# Patient Record
Sex: Female | Born: 1955 | Race: Black or African American | Hispanic: No | Smoking: Never smoker
Health system: Southern US, Community
[De-identification: ages and names within clinical notes are randomized; demographics above are authoritative.]

## PROBLEM LIST (undated history)

## (undated) DIAGNOSIS — E119 Type 2 diabetes mellitus without complications: Secondary | ICD-10-CM

## (undated) DIAGNOSIS — C801 Malignant (primary) neoplasm, unspecified: Secondary | ICD-10-CM

## (undated) DIAGNOSIS — I2510 Atherosclerotic heart disease of native coronary artery without angina pectoris: Secondary | ICD-10-CM

## (undated) DIAGNOSIS — Z9221 Personal history of antineoplastic chemotherapy: Secondary | ICD-10-CM

## (undated) DIAGNOSIS — I10 Essential (primary) hypertension: Secondary | ICD-10-CM

## (undated) HISTORY — PX: CYST EXCISION: SHX5701

## (undated) HISTORY — PX: COLONOSCOPY: SHX174

---

## 1986-10-13 HISTORY — PX: FRACTURE SURGERY: SHX138

## 1993-10-13 HISTORY — PX: ABDOMINAL HYSTERECTOMY: SHX81

## 2004-11-13 ENCOUNTER — Ambulatory Visit: Payer: Self-pay

## 2006-03-12 ENCOUNTER — Ambulatory Visit: Payer: Self-pay | Admitting: Family Medicine

## 2007-04-28 ENCOUNTER — Ambulatory Visit: Payer: Self-pay | Admitting: Family Medicine

## 2007-07-23 ENCOUNTER — Ambulatory Visit: Payer: Self-pay | Admitting: Podiatry

## 2007-07-23 ENCOUNTER — Other Ambulatory Visit: Payer: Self-pay

## 2007-07-30 ENCOUNTER — Ambulatory Visit: Payer: Self-pay | Admitting: Podiatry

## 2009-05-24 DIAGNOSIS — E7800 Pure hypercholesterolemia, unspecified: Secondary | ICD-10-CM | POA: Insufficient documentation

## 2009-05-24 DIAGNOSIS — I152 Hypertension secondary to endocrine disorders: Secondary | ICD-10-CM | POA: Insufficient documentation

## 2009-05-24 DIAGNOSIS — E1159 Type 2 diabetes mellitus with other circulatory complications: Secondary | ICD-10-CM | POA: Insufficient documentation

## 2009-05-24 DIAGNOSIS — E119 Type 2 diabetes mellitus without complications: Secondary | ICD-10-CM | POA: Insufficient documentation

## 2009-10-13 HISTORY — PX: COLON RESECTION: SHX5231

## 2009-12-12 ENCOUNTER — Ambulatory Visit: Payer: Self-pay | Admitting: Family Medicine

## 2010-07-04 ENCOUNTER — Ambulatory Visit: Payer: Self-pay | Admitting: Gastroenterology

## 2010-07-09 ENCOUNTER — Ambulatory Visit: Payer: Self-pay | Admitting: Gastroenterology

## 2010-07-11 ENCOUNTER — Ambulatory Visit: Payer: Self-pay | Admitting: Emergency Medicine

## 2010-07-12 ENCOUNTER — Inpatient Hospital Stay: Payer: Self-pay | Admitting: Emergency Medicine

## 2010-07-13 ENCOUNTER — Ambulatory Visit: Payer: Self-pay | Admitting: Internal Medicine

## 2010-07-16 LAB — PATHOLOGY REPORT

## 2010-07-25 ENCOUNTER — Ambulatory Visit: Payer: Self-pay | Admitting: Internal Medicine

## 2010-08-12 ENCOUNTER — Ambulatory Visit: Payer: Self-pay | Admitting: Emergency Medicine

## 2010-08-13 ENCOUNTER — Ambulatory Visit: Payer: Self-pay | Admitting: Internal Medicine

## 2010-08-16 ENCOUNTER — Observation Stay: Payer: Self-pay | Admitting: Internal Medicine

## 2010-09-12 ENCOUNTER — Ambulatory Visit: Payer: Self-pay | Admitting: Internal Medicine

## 2010-10-13 ENCOUNTER — Ambulatory Visit: Payer: Self-pay | Admitting: Internal Medicine

## 2010-11-13 ENCOUNTER — Ambulatory Visit: Payer: Self-pay | Admitting: Internal Medicine

## 2010-12-12 ENCOUNTER — Ambulatory Visit: Payer: Self-pay | Admitting: Internal Medicine

## 2011-01-11 IMAGING — CR DG CHEST 1V PORT
1 series · 1 of 1 positions shown · non-contrast
Comparison: none

REASON FOR EXAM: to see the porta catheter and look for pneumothorax in
recovery
COMMENTS:

[view not recorded]
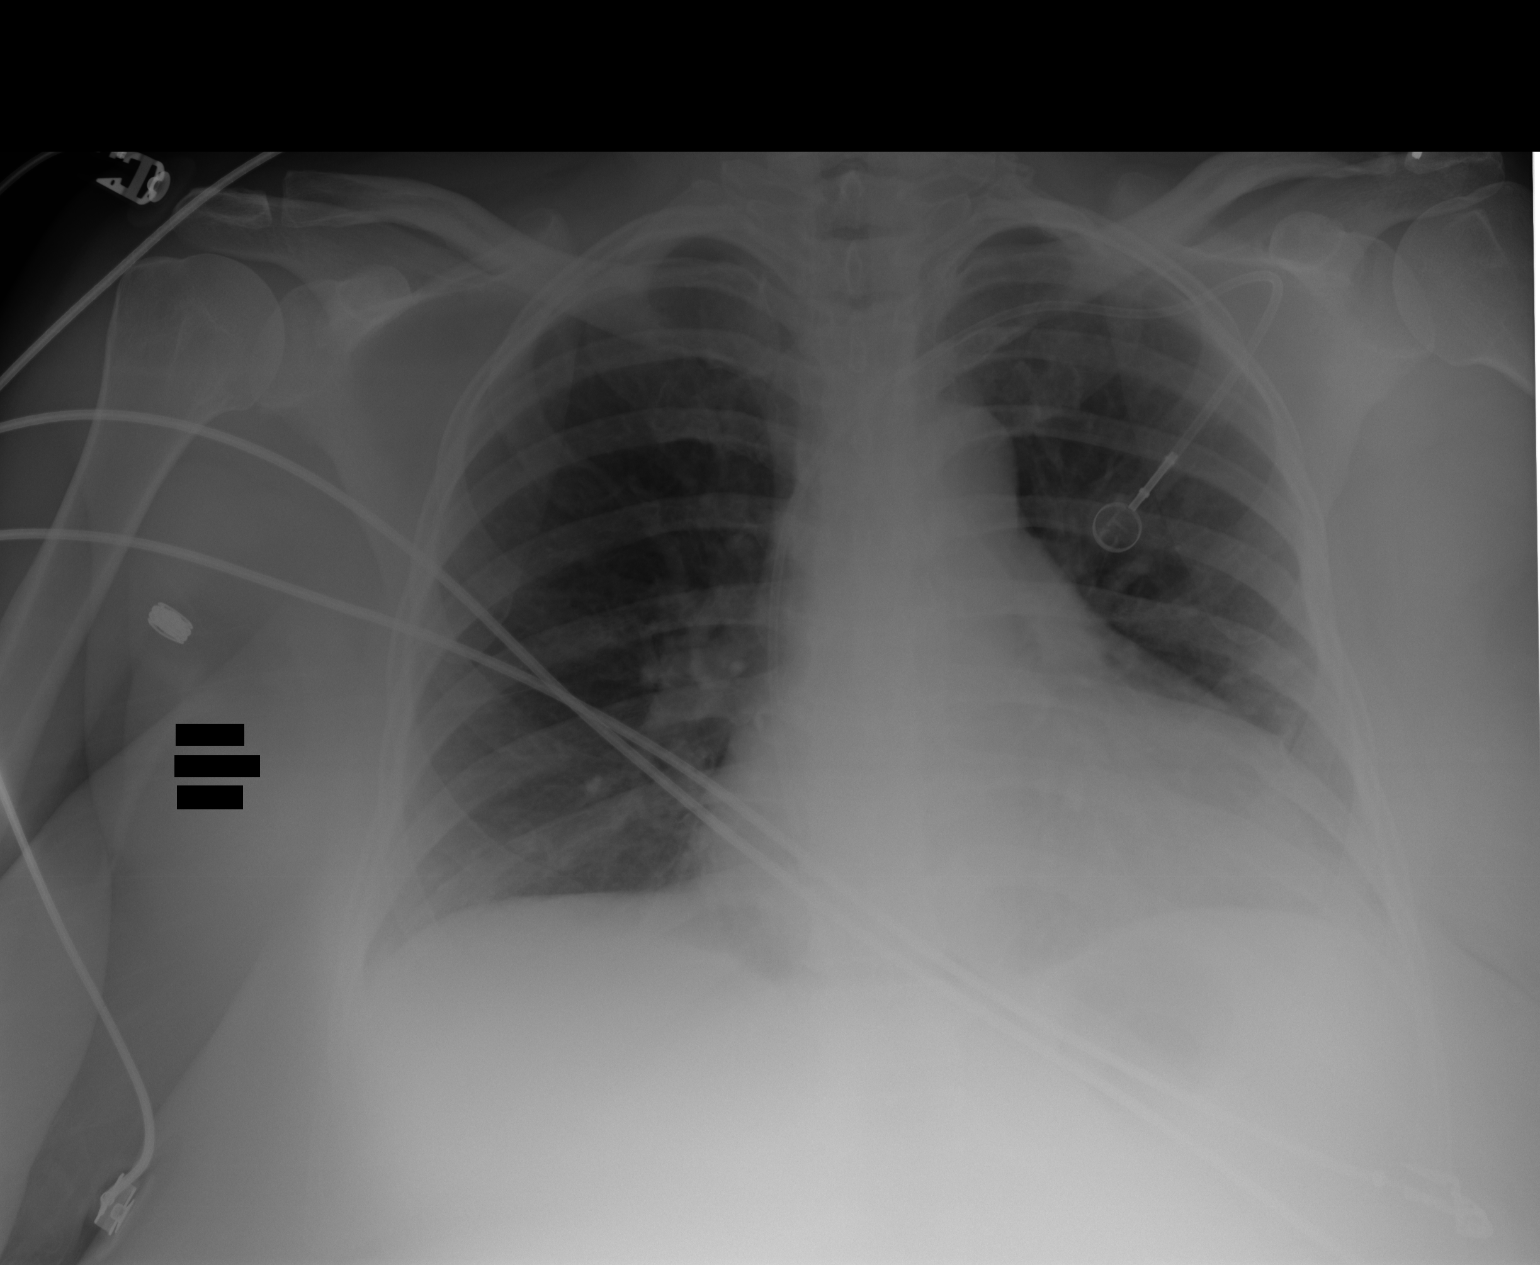

[1 of 1 positions shown; findings below may reference images not displayed]

PROCEDURE:     DXR - DXR PORTABLE CHEST SINGLE VIEW  - August 12, 2010 [DATE]

RESULT:     A left-sided Port-A-Cath device is present with the tip of the
catheter in the superior vena cava at the junction with right atrium. The
lungs appear fully inflated and clear. The heart is at the upper limits of
normal in size.
IMPRESSION: Left-sided Port-A-Cath present. No evidence of complication.

## 2011-01-12 ENCOUNTER — Ambulatory Visit: Payer: Self-pay | Admitting: Internal Medicine

## 2011-01-18 IMAGING — CR DG CHEST 2V
1 series · 2 of 2 positions shown · non-contrast
Comparison: none

REASON FOR EXAM: chf
COMMENTS:

[Series 1: view not recorded · 0.17mm/px · 2 of 2 slices shown]
[im 1/2]
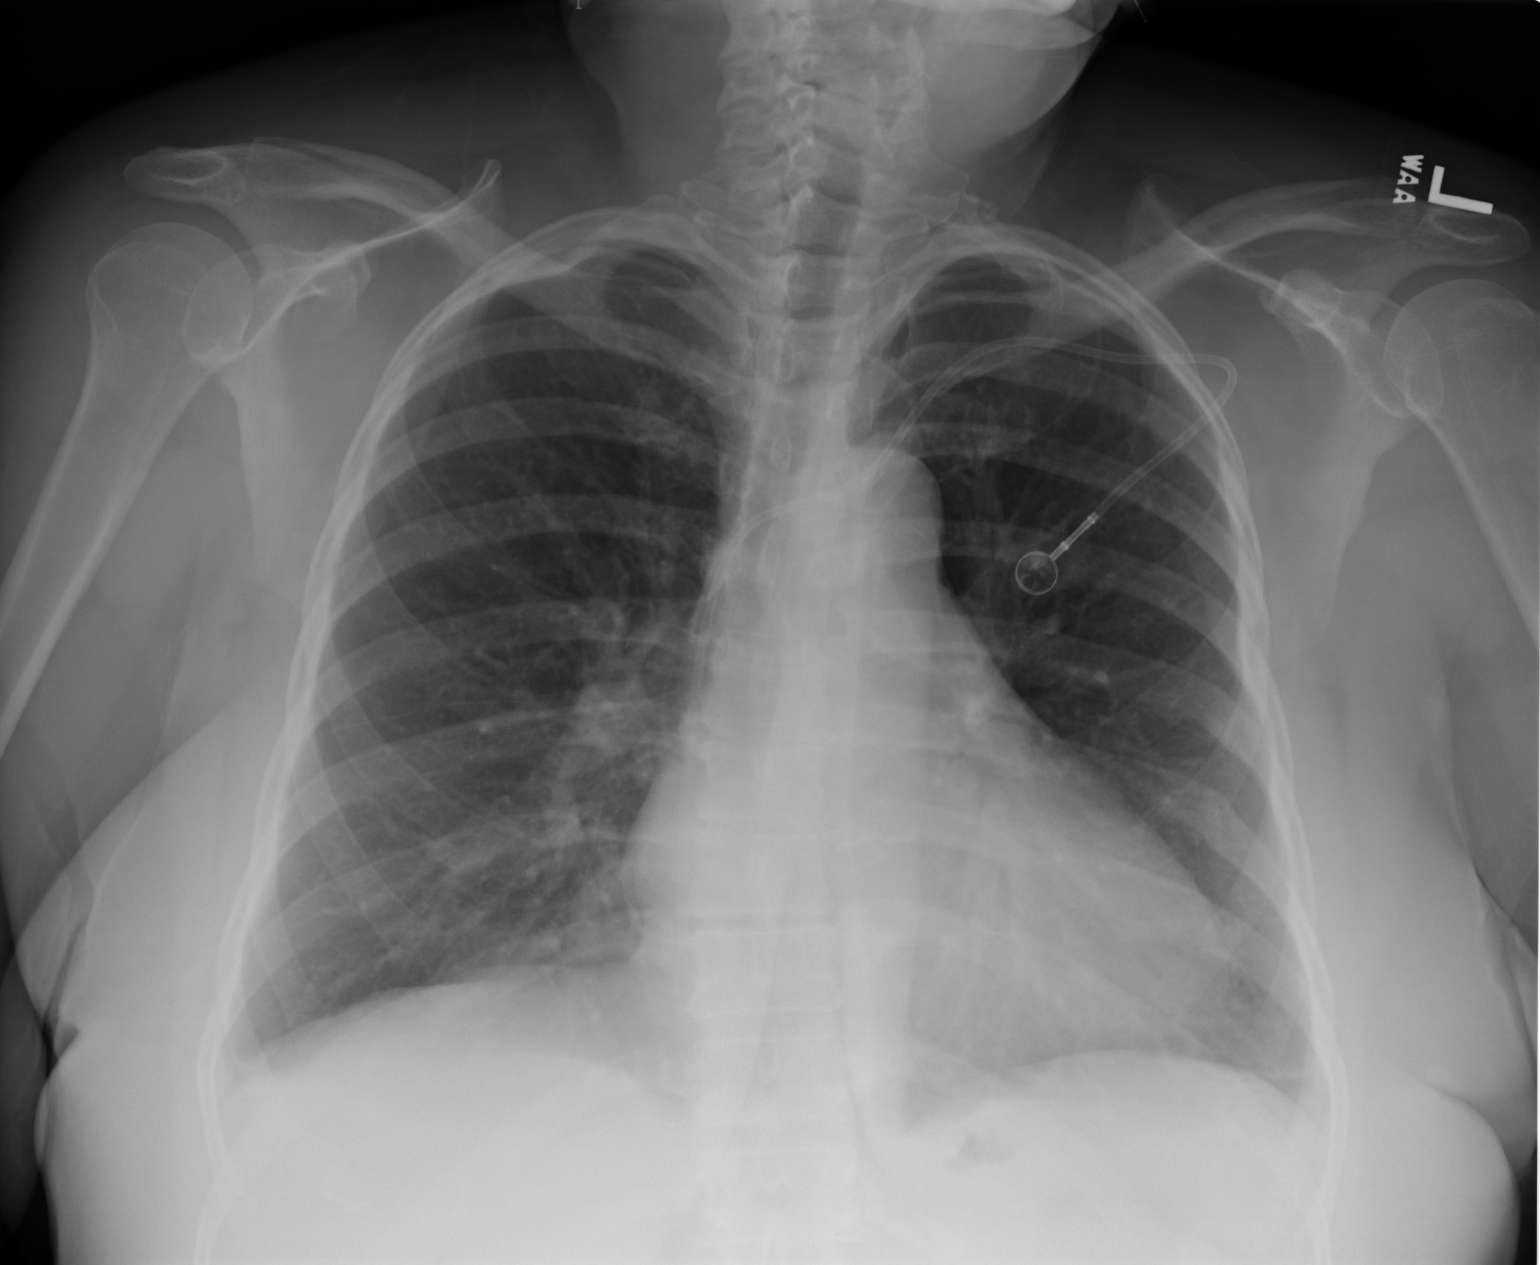
[im 2/2]
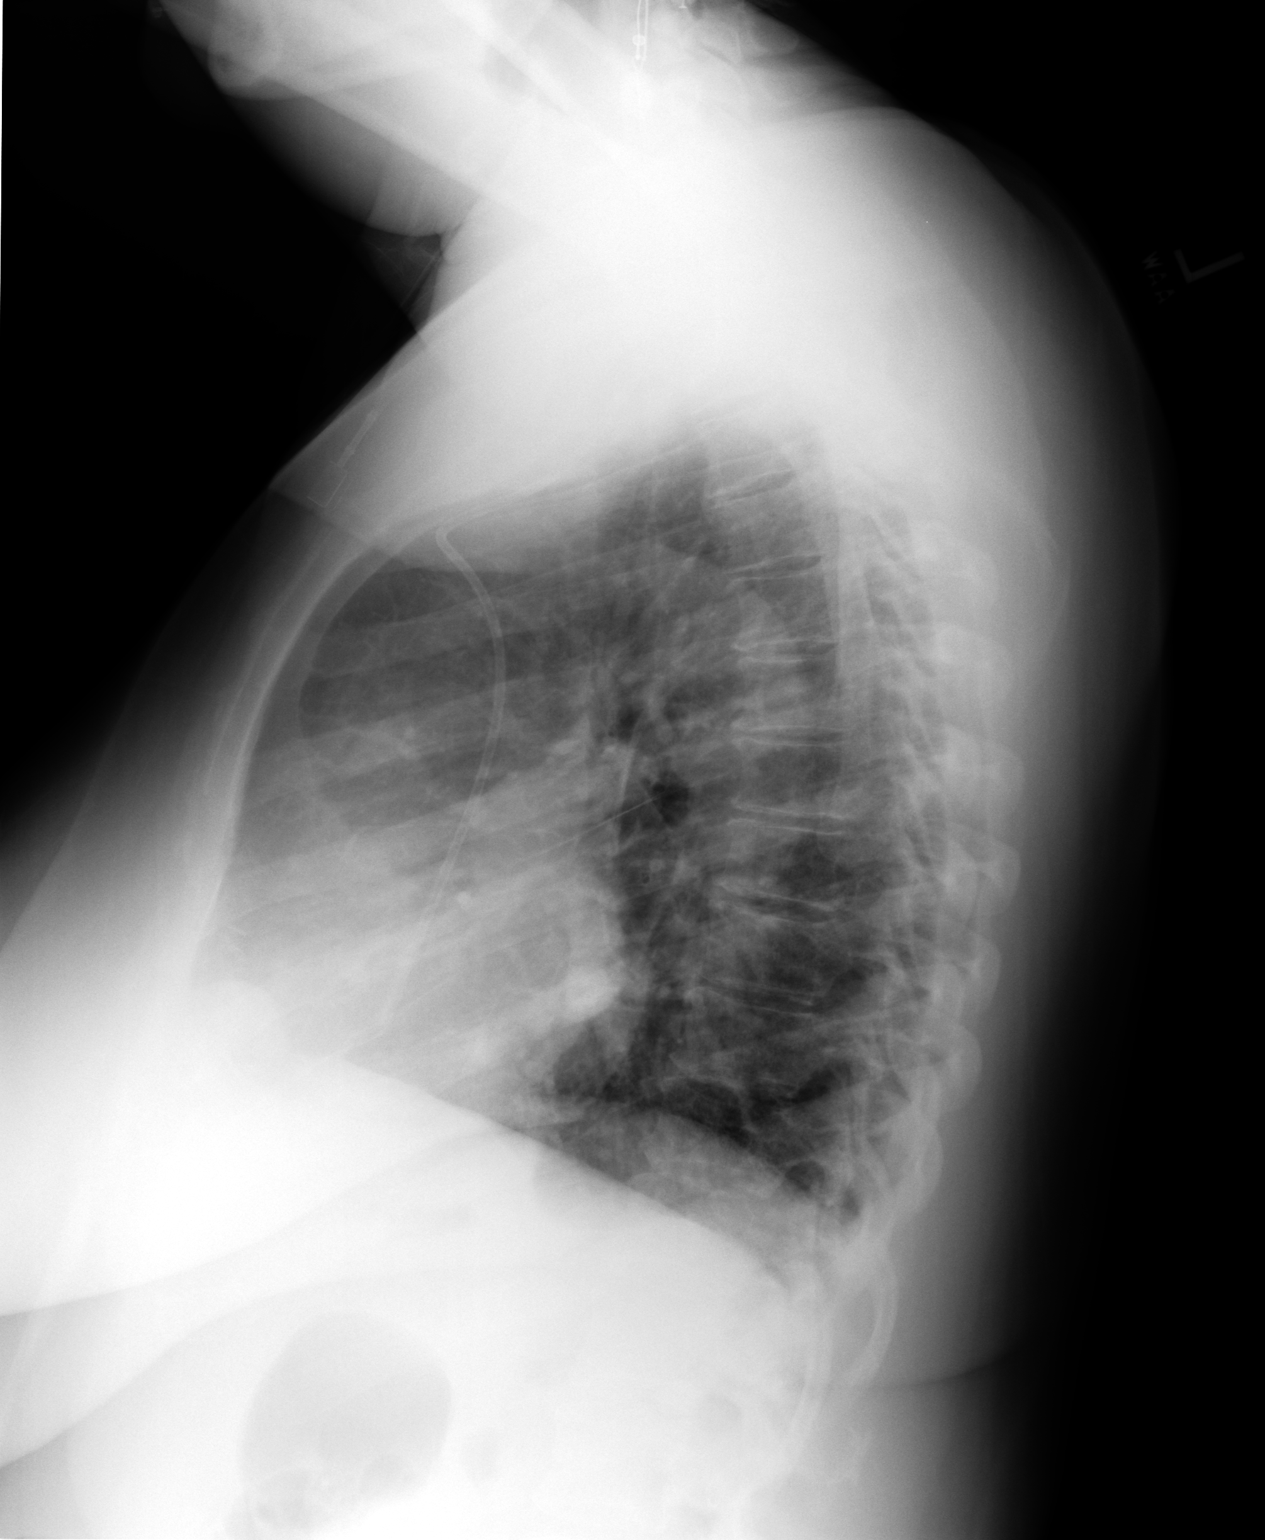

[2 of 2 positions shown; findings below may reference images not displayed]

PROCEDURE:     DXR - DXR CHEST PA (OR AP) AND LATERAL  - August 19, 2010 [DATE]

RESULT:     Comparison is made to the prior exam of 08/17/2010.

A Port-A-Cath is present with the tip projected near the junction of the
superior vena cava and right atrium. The lung fields are clear. No
pneumonia, pneumothorax or pleural effusion is seen. No pulmonary nodules or
pulmonary masses are seen. The heart is upper limits for normal in size. No
acute bony abnormalities are seen. There is deformity of multiple left ribs
compatible with residual change from prior fractures or prior surgery.
IMPRESSION: 1.  No acute changes are identified.
2.  The heart is upper limits for normal in size.
3.  A Port-A-Cath is present.
4.  No findings indicative of CHF are identified on the current exam.

## 2011-02-11 ENCOUNTER — Ambulatory Visit: Payer: Self-pay | Admitting: Internal Medicine

## 2011-02-22 IMAGING — CT CT CHEST W/O CM
1 of 2 series · 14 of 32 positions shown, 18 images · non-contrast
Comparison: none

REASON FOR EXAM: DIABETIC ON METFORMIN Pulmonary nodules
COMMENTS:

PROCEDURE:     KCT - KCT CHEST WITHOUT CONTRAST  - September 23, 2010  [DATE]
RESULT:
TECHNIQUE: Noncontrast CT of the chest is reconstructed at 5 mm slice
thickness in the axial plane and compared to images dated 08/05/2010.

[Series 2: chest w/o 5.0 i41f · axial · non-contrast · 0.82mm/px · z∈[-810,-584]mm · 14 of 55 slices shown, 18 images]
[im 5/55  mediastinal]
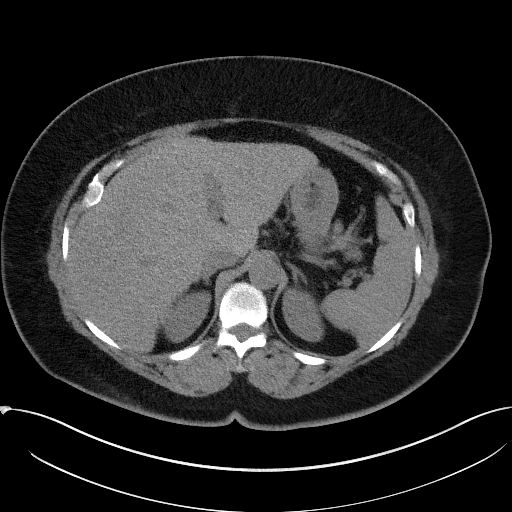
[im 5/55  lung]
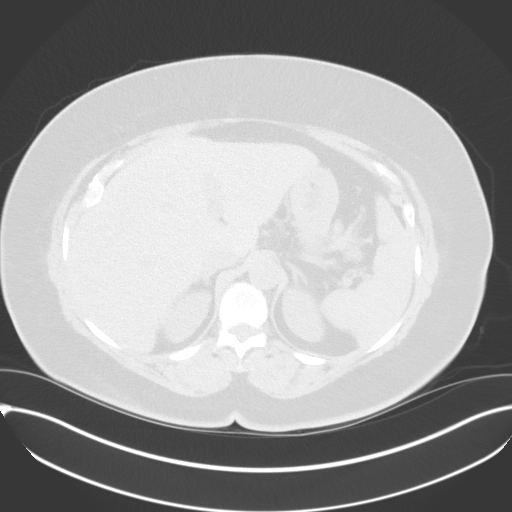
[im 9/55  lung]
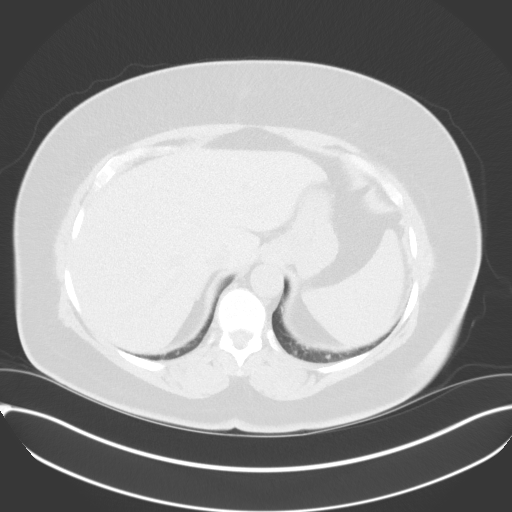
[im 13/55  lung]
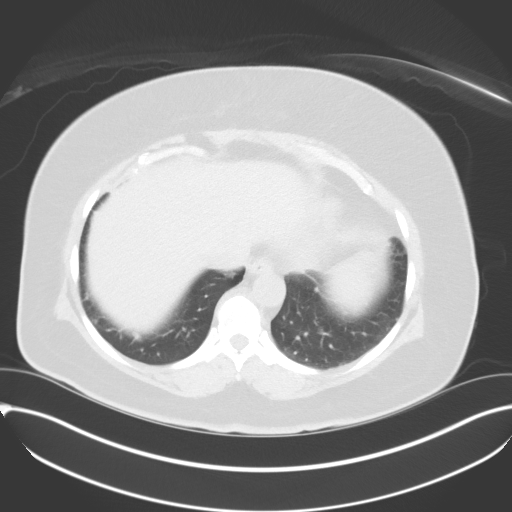
[im 17/55  lung]
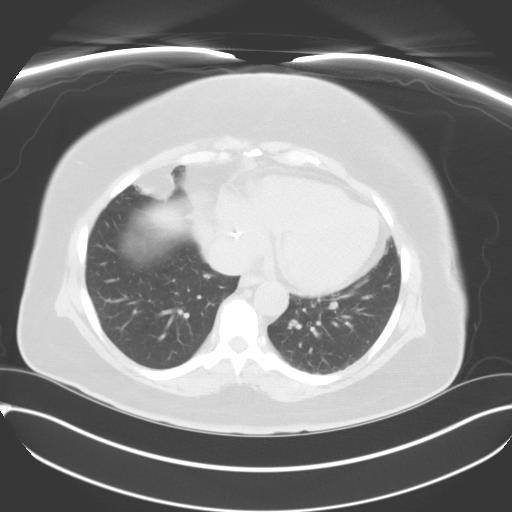
[im 21/55  mediastinal]
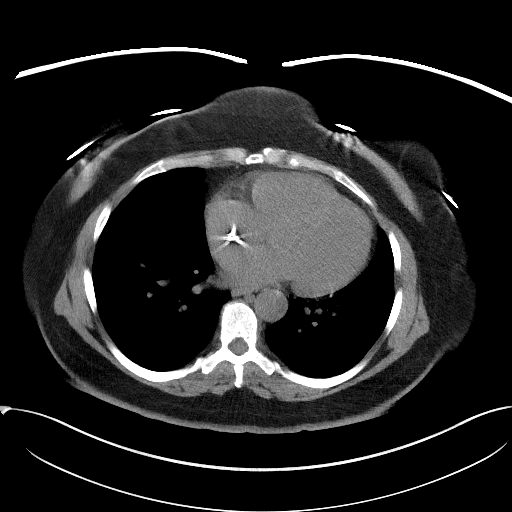
[im 21/55  lung]
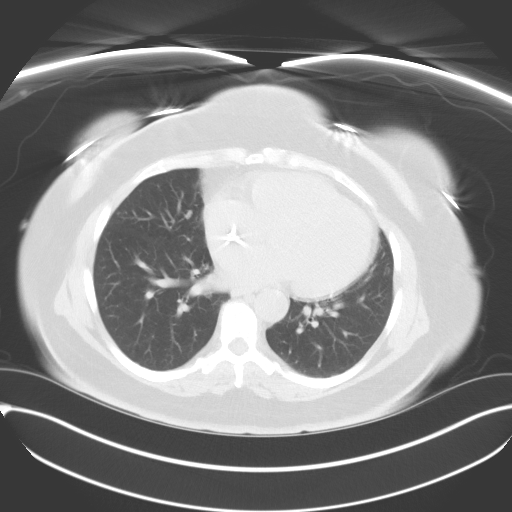
[im 25/55  lung]
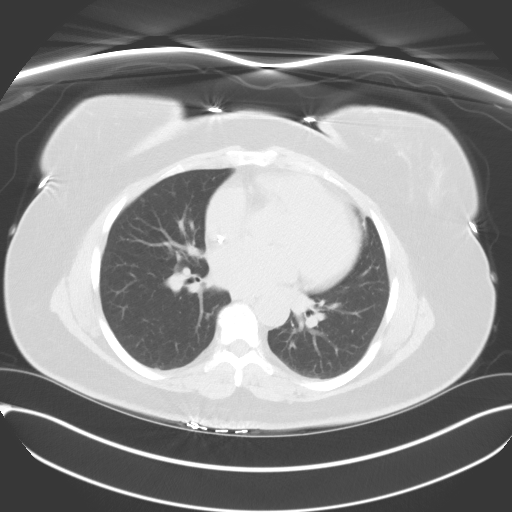
[im 26/55  lung]
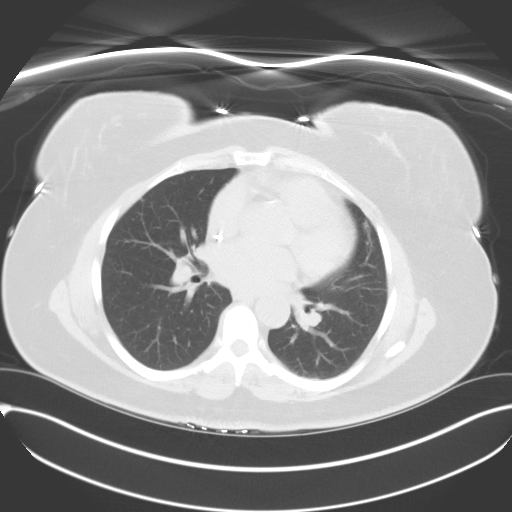
[im 28/55  lung]
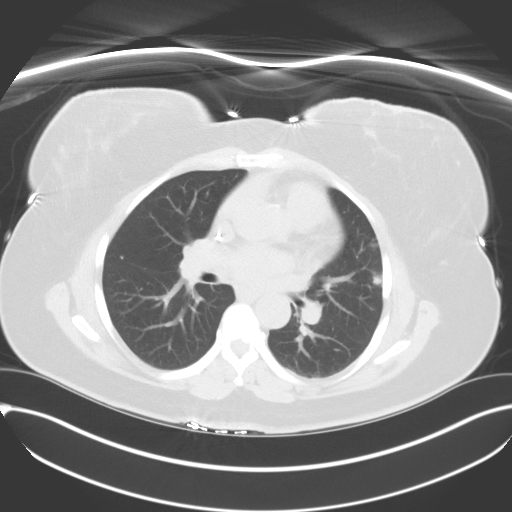
[im 30/55  mediastinal]
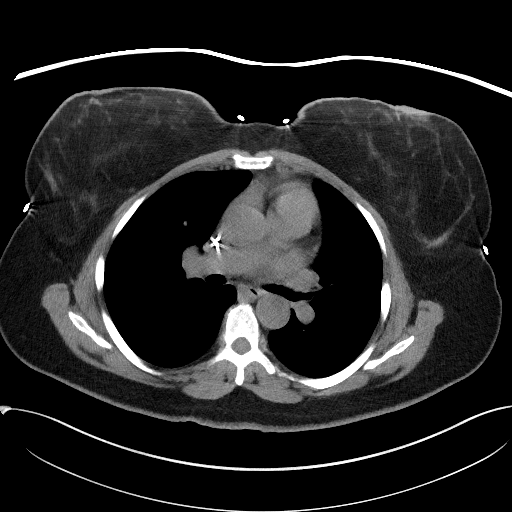
[im 30/55  lung]
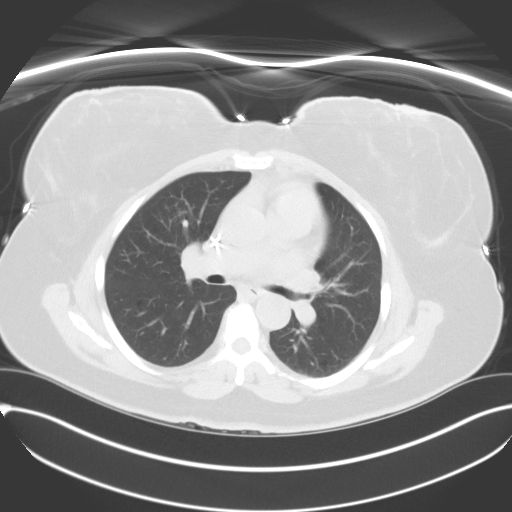
[im 34/55  lung]
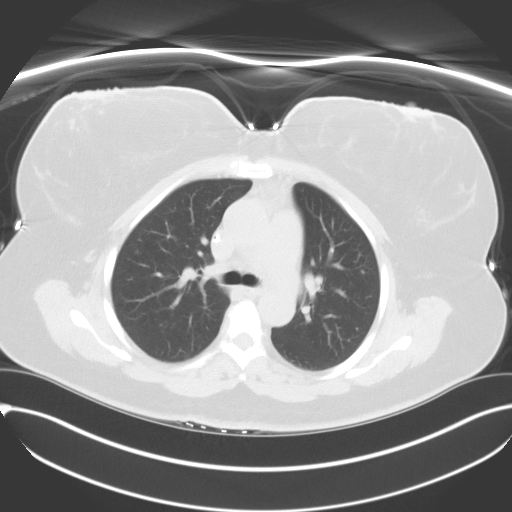
[im 38/55  lung]
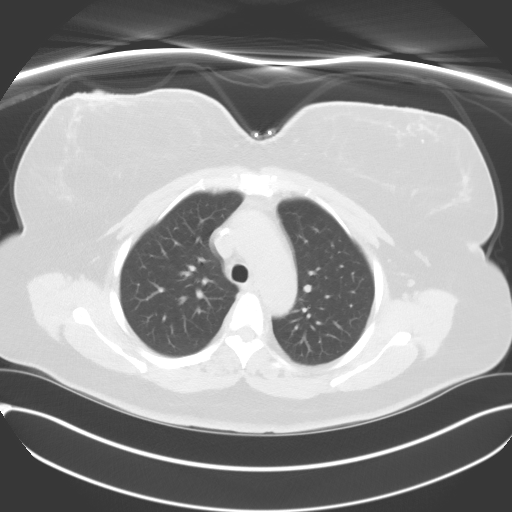
[im 42/55  lung]
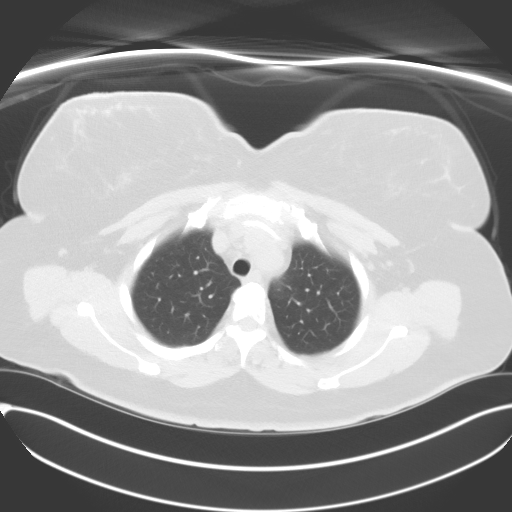
[im 46/55  mediastinal]
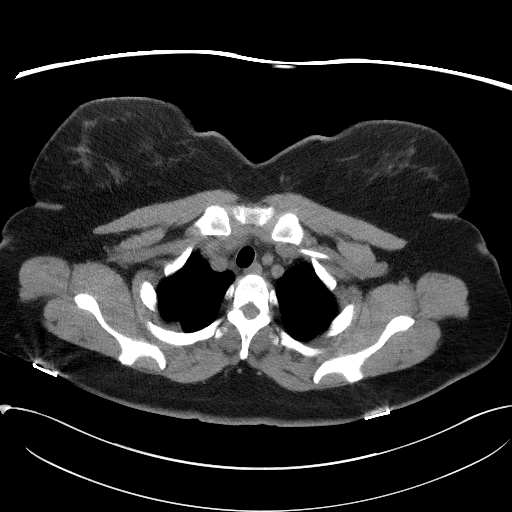
[im 46/55  lung]
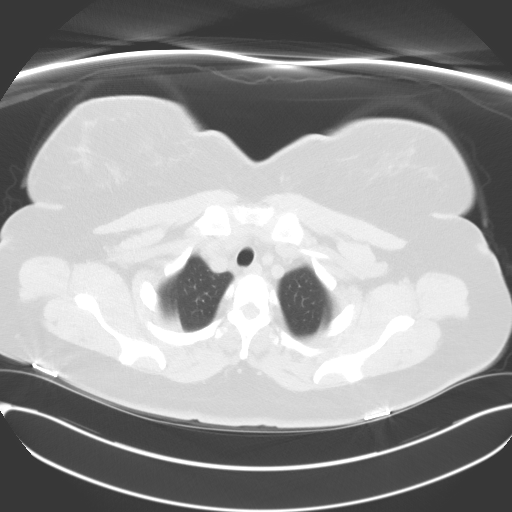
[im 50/55  lung]
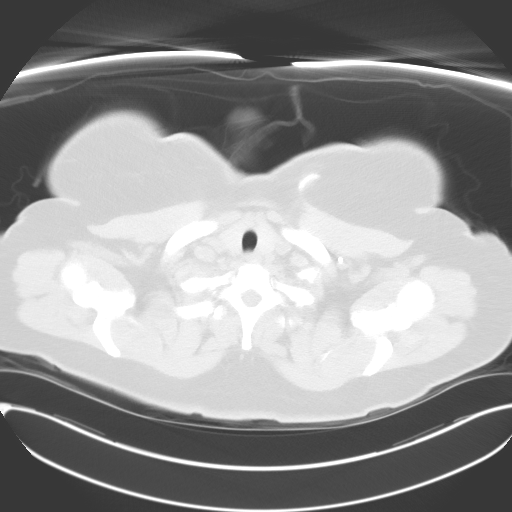

[14 of 32 positions shown; findings below may reference images not displayed]

FINDINGS: Lung window images show persistent areas of nodular density in
both lungs with a patchy, ill-defined area of increased density in the right
upper lobe anteriorly between images 24 and 25 which could represent some
minimal atelectasis or infiltrate. There is subpleural nodularity, stable in
appearance laterally in the left upper lobe on image 28, approximately
mm in diameter with ill-defined density anterior to it on image 28 and 29
measuring approximately 5 mm. Paraspinal right lower lobe nodularity on
image 32 measures 4.4 mm with right middle lobe nodular density on image 33
measuring 3.1 mm. Some additional medial segment right middle lobe
nodularity is stable measuring approximately 3.9 mm with right middle lobe
lateral nodular density, stable, measuring 5.3 mm. There is minimal lingular
nodularity which is also stable and nonspecific measuring 5.3 mm. No new
nodular density is seen. The measurements may be slightly different from
demonstrated previously but some of this could be secondary to the plane of
scan. Numerous, tiny nodular densities are seen in the posterior gutters.
These are smaller than 3 mm in size. The lungs are otherwise clear aside
from the patchy right upper lobe density mentioned above. There is no
mediastinal or hilar mass appreciated on this noncontrast exam. The included
upper abdominal structures appear unremarkable.
IMPRESSION: Multiple, bilateral pulmonary nodular densities without
calcification. No new nodules are seen. Ill-defined, increased density in
the right upper lobe as described. Minimal atelectasis or infiltrate could
be considered.

## 2011-02-26 ENCOUNTER — Ambulatory Visit: Payer: Self-pay | Admitting: Family Medicine

## 2011-03-06 ENCOUNTER — Ambulatory Visit: Payer: Self-pay | Admitting: Emergency Medicine

## 2011-03-13 ENCOUNTER — Ambulatory Visit: Payer: Self-pay | Admitting: Family Medicine

## 2011-03-14 ENCOUNTER — Ambulatory Visit: Payer: Self-pay | Admitting: Internal Medicine

## 2011-04-10 LAB — CEA: CEA: 1.8 ng/mL (ref 0.0–4.7)

## 2011-04-13 ENCOUNTER — Ambulatory Visit: Payer: Self-pay | Admitting: Internal Medicine

## 2012-06-22 ENCOUNTER — Ambulatory Visit: Payer: Self-pay | Admitting: Family Medicine

## 2013-11-30 ENCOUNTER — Ambulatory Visit: Payer: Self-pay | Admitting: Family Medicine

## 2014-05-05 DIAGNOSIS — M76819 Anterior tibial syndrome, unspecified leg: Secondary | ICD-10-CM | POA: Insufficient documentation

## 2015-02-08 ENCOUNTER — Ambulatory Visit: Admit: 2015-02-08 | Disposition: A | Discharge status: Home / Self Care | Payer: Self-pay | Admitting: Family Medicine

## 2015-04-20 ENCOUNTER — Other Ambulatory Visit: Payer: Self-pay | Admitting: Family Medicine

## 2015-04-20 DIAGNOSIS — E1169 Type 2 diabetes mellitus with other specified complication: Secondary | ICD-10-CM | POA: Insufficient documentation

## 2015-04-20 DIAGNOSIS — E785 Hyperlipidemia, unspecified: Secondary | ICD-10-CM

## 2015-04-25 ENCOUNTER — Other Ambulatory Visit: Payer: Self-pay

## 2015-04-25 DIAGNOSIS — I10 Essential (primary) hypertension: Secondary | ICD-10-CM

## 2015-04-25 DIAGNOSIS — I340 Nonrheumatic mitral (valve) insufficiency: Secondary | ICD-10-CM | POA: Insufficient documentation

## 2015-04-25 DIAGNOSIS — E119 Type 2 diabetes mellitus without complications: Secondary | ICD-10-CM

## 2015-04-25 DIAGNOSIS — R519 Headache, unspecified: Secondary | ICD-10-CM | POA: Insufficient documentation

## 2015-04-25 DIAGNOSIS — R9431 Abnormal electrocardiogram [ECG] [EKG]: Secondary | ICD-10-CM | POA: Insufficient documentation

## 2015-04-25 DIAGNOSIS — I5189 Other ill-defined heart diseases: Secondary | ICD-10-CM | POA: Insufficient documentation

## 2015-04-25 DIAGNOSIS — E785 Hyperlipidemia, unspecified: Secondary | ICD-10-CM

## 2015-04-25 DIAGNOSIS — G4700 Insomnia, unspecified: Secondary | ICD-10-CM | POA: Insufficient documentation

## 2015-04-25 DIAGNOSIS — C189 Malignant neoplasm of colon, unspecified: Secondary | ICD-10-CM | POA: Insufficient documentation

## 2015-04-25 DIAGNOSIS — Z85038 Personal history of other malignant neoplasm of large intestine: Secondary | ICD-10-CM | POA: Insufficient documentation

## 2015-04-25 DIAGNOSIS — R51 Headache: Secondary | ICD-10-CM

## 2015-04-25 MED ORDER — AMLODIPINE BESYLATE 5 MG PO TABS: 5.0000 mg | ORAL_TABLET | Freq: Every day | ORAL | Status: DC

## 2015-04-25 MED ORDER — LISINOPRIL-HYDROCHLOROTHIAZIDE 10-12.5 MG PO TABS: 1.0000 | ORAL_TABLET | Freq: Every day | ORAL | Status: DC

## 2015-04-25 MED ORDER — PRAVASTATIN SODIUM 40 MG PO TABS: 40.0000 mg | ORAL_TABLET | Freq: Every day | ORAL | Status: DC

## 2015-04-25 MED ORDER — METFORMIN HCL 500 MG PO TABS: 500.0000 mg | ORAL_TABLET | Freq: Three times a day (TID) | ORAL | Status: DC

## 2015-06-22 ENCOUNTER — Ambulatory Visit (INDEPENDENT_AMBULATORY_CARE_PROVIDER_SITE_OTHER): Payer: No Typology Code available for payment source | Admitting: Family Medicine

## 2015-06-22 ENCOUNTER — Encounter: Payer: Self-pay | Admitting: Family Medicine

## 2015-06-22 VITALS — BP 126/84 | HR 68 | Temp 98.6°F | Resp 16 | Ht 65.0 in | Wt 239.0 lb

## 2015-06-22 DIAGNOSIS — I10 Essential (primary) hypertension: Secondary | ICD-10-CM

## 2015-06-22 DIAGNOSIS — J069 Acute upper respiratory infection, unspecified: Secondary | ICD-10-CM | POA: Diagnosis not present

## 2015-06-22 DIAGNOSIS — R059 Cough, unspecified: Secondary | ICD-10-CM | POA: Insufficient documentation

## 2015-06-22 DIAGNOSIS — R05 Cough: Secondary | ICD-10-CM | POA: Diagnosis not present

## 2015-06-22 DIAGNOSIS — J019 Acute sinusitis, unspecified: Secondary | ICD-10-CM

## 2015-06-22 MED ORDER — HYDROCODONE-HOMATROPINE 5-1.5 MG/5ML PO SYRP: 5.0000 mL | ORAL_SOLUTION | Freq: Three times a day (TID) | ORAL | Status: DC | PRN

## 2015-06-22 MED ORDER — AMOXICILLIN-POT CLAVULANATE 875-125 MG PO TABS: 1.0000 | ORAL_TABLET | Freq: Two times a day (BID) | ORAL | Status: DC

## 2015-06-22 NOTE — Progress Notes (Signed)
        Patient: Jodi Becker Female    DOB: December 19, 1955   59 y.o.   MRN: [id number] Visit Date: 06/22/2015  Today's Provider: Margarita Rana, MD   Chief Complaint  Patient presents with  . Cough   Subjective:    Cough       Allergies  Allergen Reactions  . Sulfa Antibiotics    Previous Medications   AMLODIPINE (NORVASC) 5 MG TABLET    Take 1 tablet (5 mg total) by mouth daily.   ASPIRIN 81 MG CHEWABLE TABLET    Chew by mouth.   CHOLECALCIFEROL (VITAMIN D3) 1000 UNITS CAPS    Take by mouth.   LISINOPRIL-HYDROCHLOROTHIAZIDE (PRINZIDE,ZESTORETIC) 10-12.5 MG PER TABLET    Take 1 tablet by mouth daily.   MAGNESIUM 250 MG TABS    Take by mouth.   METFORMIN (GLUCOPHAGE) 500 MG TABLET    Take 1 tablet (500 mg total) by mouth 3 (three) times daily.   PRAVASTATIN (PRAVACHOL) 40 MG TABLET    Take 1 tablet (40 mg total) by mouth daily.   SITAGLIPTIN (JANUVIA) 100 MG TABLET    Take by mouth.    Review of Systems  Respiratory: Positive for cough.     Social History  Substance Use Topics  . Smoking status: Never Smoker   . Smokeless tobacco: Never Used  . Alcohol Use: Yes   Objective:   There were no vitals taken for this visit.  Physical Exam      Assessment & Plan:           Margarita Rana, MD  [facility]
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
## 2015-07-09 ENCOUNTER — Encounter: Payer: Self-pay | Admitting: Family Medicine

## 2015-07-09 ENCOUNTER — Ambulatory Visit (INDEPENDENT_AMBULATORY_CARE_PROVIDER_SITE_OTHER): Payer: No Typology Code available for payment source | Admitting: Family Medicine

## 2015-07-09 VITALS — BP 104/74 | HR 64 | Temp 98.3°F | Resp 16 | Wt 239.0 lb

## 2015-07-09 DIAGNOSIS — H6980 Other specified disorders of Eustachian tube, unspecified ear: Secondary | ICD-10-CM | POA: Insufficient documentation

## 2015-07-09 DIAGNOSIS — E119 Type 2 diabetes mellitus without complications: Secondary | ICD-10-CM

## 2015-07-09 DIAGNOSIS — I10 Essential (primary) hypertension: Secondary | ICD-10-CM | POA: Diagnosis not present

## 2015-07-09 DIAGNOSIS — H6981 Other specified disorders of Eustachian tube, right ear: Secondary | ICD-10-CM

## 2015-07-09 DIAGNOSIS — J309 Allergic rhinitis, unspecified: Secondary | ICD-10-CM | POA: Insufficient documentation

## 2015-07-09 DIAGNOSIS — H6991 Unspecified Eustachian tube disorder, right ear: Secondary | ICD-10-CM

## 2015-07-09 DIAGNOSIS — Z23 Encounter for immunization: Secondary | ICD-10-CM | POA: Diagnosis not present

## 2015-07-09 DIAGNOSIS — H6990 Unspecified Eustachian tube disorder, unspecified ear: Secondary | ICD-10-CM | POA: Insufficient documentation

## 2015-07-09 DIAGNOSIS — J3089 Other allergic rhinitis: Secondary | ICD-10-CM | POA: Diagnosis not present

## 2015-07-09 MED ORDER — FLUTICASONE PROPIONATE 50 MCG/ACT NA SUSP: 2.0000 | Freq: Every day | NASAL | Status: DC
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
## 2015-07-10 LAB — HEMOGLOBIN A1C
Est. average glucose Bld gHb Est-mCnc: 171 mg/dL
Hgb A1c MFr Bld: 7.6 % — ABNORMAL HIGH (ref 4.8–5.6)

## 2015-07-11 ENCOUNTER — Other Ambulatory Visit: Payer: Self-pay

## 2015-07-11 MED ORDER — GLIPIZIDE ER 2.5 MG PO TB24: 2.5000 mg | ORAL_TABLET | Freq: Every day | ORAL | Status: DC

## 2015-07-11 NOTE — Telephone Encounter (Signed)
Informed pt as below. Pt reports Januvia is too expensive, and has not started that medication. Provider wanted to start Glipizide, but pt has Sulfa allergy. Please advise. Advised pt of lab results. [name], CMA

## 2015-07-11 NOTE — Telephone Encounter (Signed)
-----   Message from Margarita Rana, MD sent at 07/10/2015  9:12 AM EDT ----- Blood sugar not at goal at 7.6.  Please see if patient thinks she can adjust lifestyle to bring down sugars or would like to add another medication. Thanks.

## 2015-07-25 ENCOUNTER — Telehealth: Payer: Self-pay | Admitting: Family Medicine

## 2015-07-25 DIAGNOSIS — I10 Essential (primary) hypertension: Secondary | ICD-10-CM

## 2015-07-25 MED ORDER — HYDROCHLOROTHIAZIDE 12.5 MG PO TABS: 12.5000 mg | ORAL_TABLET | Freq: Every day | ORAL | Status: DC

## 2015-07-25 NOTE — Telephone Encounter (Signed)
Ok to stop and change to HCTZ 12.5 for now. Please send in new rx and recheck ov in 4 weeks. Thanks.

## 2015-07-25 NOTE — Telephone Encounter (Signed)
Pt advised; rx sent to [facility]; apt made for 08/15/2015.   Thanks,   -Mickel Baas

## 2015-07-25 NOTE — Telephone Encounter (Signed)
Pt thinks that the lisinopril-hydrochlorothiazide (PRINZIDE,ZESTORETIC) 10-12.5 MG per tablet is causing her cough. Pt wanted to know if she should try something else. Pharmacy: [facility]. Thanks TNP

## 2015-08-09 ENCOUNTER — Telehealth: Payer: Self-pay

## 2015-08-09 MED ORDER — LOSARTAN POTASSIUM 50 MG PO TABS: 50.0000 mg | ORAL_TABLET | Freq: Every day | ORAL | Status: DC

## 2015-08-09 NOTE — Telephone Encounter (Signed)
Pt advised.   Thanks,   -Camerin Jimenez  

## 2015-08-09 NOTE — Telephone Encounter (Signed)
Sent new rx.  Thanks.

## 2015-08-09 NOTE — Telephone Encounter (Signed)
Patient called and states that her Lisinopril-HCTZ due to causing possible cough was switched to HCTZ only about 2 weeks ago and her B/P has been running around 150-160s/90-100s, she checks B/P once daily. She has had some headaches, no chest pain or tightness, no SOb no blurry vision. Please advise.

## 2015-08-10 ENCOUNTER — Telehealth: Payer: Self-pay | Admitting: Family Medicine

## 2015-08-15 ENCOUNTER — Ambulatory Visit (INDEPENDENT_AMBULATORY_CARE_PROVIDER_SITE_OTHER): Payer: Managed Care, Other (non HMO) | Admitting: Family Medicine

## 2015-08-15 ENCOUNTER — Encounter: Payer: Self-pay | Admitting: Family Medicine

## 2015-08-15 VITALS — BP 118/74 | HR 80 | Temp 98.5°F | Resp 16 | Wt 240.0 lb

## 2015-08-15 DIAGNOSIS — R059 Cough, unspecified: Secondary | ICD-10-CM

## 2015-08-15 DIAGNOSIS — E119 Type 2 diabetes mellitus without complications: Secondary | ICD-10-CM

## 2015-08-15 DIAGNOSIS — E785 Hyperlipidemia, unspecified: Secondary | ICD-10-CM

## 2015-08-15 DIAGNOSIS — R05 Cough: Secondary | ICD-10-CM

## 2015-08-15 DIAGNOSIS — I10 Essential (primary) hypertension: Secondary | ICD-10-CM | POA: Diagnosis not present

## 2015-08-15 MED ORDER — LOSARTAN POTASSIUM-HCTZ 50-12.5 MG PO TABS
1.0000 | ORAL_TABLET | Freq: Every day | ORAL | Status: DC
Start: 2015-08-15 — End: 2015-10-19
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
## 2015-08-29 ENCOUNTER — Other Ambulatory Visit: Payer: Self-pay | Admitting: Family Medicine

## 2015-08-29 DIAGNOSIS — E119 Type 2 diabetes mellitus without complications: Secondary | ICD-10-CM

## 2015-08-29 MED ORDER — METFORMIN HCL 500 MG PO TABS: 500.0000 mg | ORAL_TABLET | Freq: Three times a day (TID) | ORAL | Status: DC

## 2015-08-29 NOTE — Telephone Encounter (Signed)
Pt contacted office for refill request on the following medications:  metFORMIN (GLUCOPHAGE) 500 MG tablet.  [facility]/mail order.  [id number]

## 2015-10-11 ENCOUNTER — Telehealth: Payer: Self-pay | Admitting: Family Medicine

## 2015-10-11 NOTE — Telephone Encounter (Signed)
Advised patient as below. Schedule patient appt next week for F/U.

## 2015-10-11 NOTE — Telephone Encounter (Signed)
Pt called wanting to know if she can take 2 of her losartan-hydrochlorothiazide (HYZAAR) 50-12.5 MG because 1 tablet does not seem to be helping much.  She is still having swelling in her legs.  Her call back is [phone]  Thanks Con Memos

## 2015-10-11 NOTE — Telephone Encounter (Signed)
Left message to call back  

## 2015-10-11 NOTE — Telephone Encounter (Signed)
Please advise. Thanks.  

## 2015-10-11 NOTE — Telephone Encounter (Signed)
Ok to double dose and ov next week to check kidney function. Thanks.

## 2015-10-19 ENCOUNTER — Ambulatory Visit (INDEPENDENT_AMBULATORY_CARE_PROVIDER_SITE_OTHER): Payer: Managed Care, Other (non HMO) | Admitting: Family Medicine

## 2015-10-19 ENCOUNTER — Encounter: Payer: Self-pay | Admitting: Family Medicine

## 2015-10-19 VITALS — BP 112/64 | HR 64 | Temp 98.9°F | Resp 16

## 2015-10-19 DIAGNOSIS — I10 Essential (primary) hypertension: Secondary | ICD-10-CM | POA: Diagnosis not present

## 2015-10-19 DIAGNOSIS — E119 Type 2 diabetes mellitus without complications: Secondary | ICD-10-CM | POA: Diagnosis not present

## 2015-10-19 DIAGNOSIS — E785 Hyperlipidemia, unspecified: Secondary | ICD-10-CM | POA: Diagnosis not present

## 2015-10-19 MED ORDER — HYDROCHLOROTHIAZIDE 50 MG PO TABS: 50.0000 mg | ORAL_TABLET | Freq: Every day | ORAL | Status: DC

## 2015-10-19 MED ORDER — LOSARTAN POTASSIUM 100 MG PO TABS: 100.0000 mg | ORAL_TABLET | Freq: Every day | ORAL | Status: DC

## 2015-10-19 NOTE — Progress Notes (Signed)
Patient ID: Jodi Becker, female   DOB: 02/08/1956, 60 y.o.   MRN: [id number]         Patient: Jodi Becker Female    DOB: [birth year]   60 y.o.   MRN: [id number] Visit Date: 10/19/2015  Today's Provider: Margarita Rana, MD   Chief Complaint  Patient presents with  . Hypertension  . Hyperlipidemia  . Diabetes   Subjective:    HPI Comments: Pt called 10/11/2015 reporting her Blood pressure and swelling has not improved, she was advised to increase her Lorsartan/HCTZ to 2 everyday and recheck in one week.  Pt reports she actually has Lorsartan 50mg  and HCTZ 12.5mg  at home (Separated pills).  She did increase the HCTZ 12.5mg  2 everyday which she says that has not helped.  She increased her HCTZ to 50mg  a day and she reports that is helping her swelling.    Hypertension This is a chronic problem. The problem has been gradually improving since onset. The problem is controlled (Since increasing her HCTZ to 4 tablets a day her blood pressure is around 130's over 80's.). Associated symptoms include headaches, malaise/fatigue and peripheral edema. Pertinent negatives include no anxiety, blurred vision, chest pain, palpitations or shortness of breath. Risk factors for coronary artery disease include diabetes mellitus, dyslipidemia and obesity. There are no compliance problems.   Diabetes She presents for her follow-up diabetic visit. She has type 2 diabetes mellitus. Hypoglycemia symptoms include headaches. Pertinent negatives for hypoglycemia include no dizziness. Associated symptoms include fatigue. Pertinent negatives for diabetes include no blurred vision, no chest pain, no polydipsia, no polyphagia and no polyuria.  Hyperlipidemia This is a chronic problem. The problem is controlled. Pertinent negatives include no chest pain or shortness of breath. There are no compliance problems.  Risk factors for coronary artery disease include dyslipidemia, diabetes mellitus and hypertension.       Allergies  Allergen Reactions  . Sulfa Antibiotics    Previous Medications   AMLODIPINE (NORVASC) 5 MG TABLET    Take 1 tablet (5 mg total) by mouth daily.   ASPIRIN 81 MG CHEWABLE TABLET    Chew by mouth.   CHOLECALCIFEROL (VITAMIN D3) 1000 UNITS CAPS    Take by mouth.   MAGNESIUM 250 MG TABS    Take by mouth.   METFORMIN (GLUCOPHAGE) 500 MG TABLET    Take 1 tablet (500 mg total) by mouth 3 (three) times daily.   PRAVASTATIN (PRAVACHOL) 40 MG TABLET    Take 1 tablet (40 mg total) by mouth daily.    Review of Systems  Constitutional: Positive for malaise/fatigue and fatigue. Negative for fever, chills, diaphoresis, activity change, appetite change and unexpected weight change.  Eyes: Negative for blurred vision.  Respiratory: Negative.  Negative for shortness of breath.   Cardiovascular: Positive for leg swelling. Negative for chest pain and palpitations.  Gastrointestinal: Positive for constipation. Negative for nausea, vomiting, abdominal pain, diarrhea, blood in stool, abdominal distention, anal bleeding and rectal pain.  Endocrine: Negative for cold intolerance, heat intolerance, polydipsia, polyphagia and polyuria.  Musculoskeletal: Negative.   Neurological: Positive for headaches. Negative for dizziness and light-headedness.    Social History  Substance Use Topics  . Smoking status: Never Smoker   . Smokeless tobacco: Never Used  . Alcohol Use: Yes     Comment: Occasionally   Objective:   There were no vitals taken for this visit.  Physical Exam      Assessment & Plan:     1. Benign hypertension  Not at goal. Was some confusion around her medication. Think we have clarified and sent in appropriate medication. Increase Losartan. Continue all 3 and recheck in 3 months.   - TSH - CBC with Differential/Platelet - losartan (COZAAR) 100 MG tablet; Take 1 tablet (100 mg total) by mouth daily.  Dispense: 90 tablet; Refill: 1 - hydrochlorothiazide (HYDRODIURIL) 50 MG  tablet; Take 1 tablet (50 mg total) by mouth daily.  Dispense: 90 tablet; Refill: 1  2. Hyperlipemia Will recheck labs. Continue current medication.   - Lipid panel  3. Well controlled diabetes mellitus ([place]) Will recheck labs.  - Hemoglobin A1c - Comprehensive metabolic panel     Patient was seen and examined by Jodi Belfast, MD, and note scribed by Jodi Becker, CMA.  I have reviewed the document for accuracy and completeness and I agree with above. - Jodi Belfast, MD   Margarita Rana, MD  [facility]

## 2015-10-20 LAB — CBC WITH DIFFERENTIAL/PLATELET
BASOS ABS: 0 10*3/uL (ref 0.0–0.2)
Basos: 0 %
EOS (ABSOLUTE): 0.2 10*3/uL (ref 0.0–0.4)
Eos: 3 %
HEMOGLOBIN: 13.1 g/dL (ref 11.1–15.9)
Hematocrit: 38.8 % (ref 34.0–46.6)
IMMATURE GRANS (ABS): 0 10*3/uL (ref 0.0–0.1)
IMMATURE GRANULOCYTES: 0 %
LYMPHS: 38 %
Lymphocytes Absolute: 2 10*3/uL (ref 0.7–3.1)
MCH: 27.9 pg (ref 26.6–33.0)
MCHC: 33.8 g/dL (ref 31.5–35.7)
MCV: 83 fL (ref 79–97)
MONOCYTES: 7 %
Monocytes Absolute: 0.4 10*3/uL (ref 0.1–0.9)
NEUTROS PCT: 52 %
Neutrophils Absolute: 2.7 10*3/uL (ref 1.4–7.0)
PLATELETS: 217 10*3/uL (ref 150–379)
RBC: 4.7 x10E6/uL (ref 3.77–5.28)
RDW: 13.8 % (ref 12.3–15.4)
WBC: 5.2 10*3/uL (ref 3.4–10.8)

## 2015-10-20 LAB — COMPREHENSIVE METABOLIC PANEL
ALBUMIN: 4.6 g/dL (ref 3.5–5.5)
ALT: 30 IU/L (ref 0–32)
AST: 17 IU/L (ref 0–40)
Albumin/Globulin Ratio: 1.9 (ref 1.1–2.5)
Alkaline Phosphatase: 104 IU/L (ref 39–117)
BILIRUBIN TOTAL: 0.4 mg/dL (ref 0.0–1.2)
BUN/Creatinine Ratio: 27 — ABNORMAL HIGH (ref 9–23)
BUN: 19 mg/dL (ref 6–24)
CALCIUM: 10.8 mg/dL — AB (ref 8.7–10.2)
CHLORIDE: 99 mmol/L (ref 96–106)
CO2: 26 mmol/L (ref 18–29)
CREATININE: 0.7 mg/dL (ref 0.57–1.00)
GFR, EST AFRICAN AMERICAN: 110 mL/min/{1.73_m2} (ref >59)
GFR, EST NON AFRICAN AMERICAN: 95 mL/min/{1.73_m2} (ref >59)
GLUCOSE: 127 mg/dL — AB (ref 65–99)
Globulin, Total: 2.4 g/dL (ref 1.5–4.5)
Potassium: 4.1 mmol/L (ref 3.5–5.2)
Sodium: 142 mmol/L (ref 134–144)
TOTAL PROTEIN: 7 g/dL (ref 6.0–8.5)

## 2015-10-20 LAB — LIPID PANEL
CHOL/HDL RATIO: 1.8 ratio (ref 0.0–4.4)
Cholesterol, Total: 152 mg/dL (ref 100–199)
HDL: 84 mg/dL (ref >39)
LDL Calculated: 50 mg/dL (ref 0–99)
Triglycerides: 88 mg/dL (ref 0–149)
VLDL Cholesterol Cal: 18 mg/dL (ref 5–40)

## 2015-10-20 LAB — HEMOGLOBIN A1C
ESTIMATED AVERAGE GLUCOSE: 151 mg/dL
HEMOGLOBIN A1C: 6.9 % — AB (ref 4.8–5.6)

## 2015-10-20 LAB — TSH: TSH: 1.32 u[IU]/mL (ref 0.450–4.500)

## 2015-10-23 ENCOUNTER — Telehealth: Payer: Self-pay

## 2015-10-23 NOTE — Telephone Encounter (Signed)
Left message to call back  

## 2015-10-23 NOTE — Telephone Encounter (Signed)
-----  Message from Margarita Rana, MD sent at 10/20/2015  9:44 AM EST ----- Labs stable. Blood sugar slightly improved. Cholesterol looks good. Calcium is elevated.  May be related to bp medication. Stop all calcium supplements if she is taking any and repeat met c and PTH in one months. Thanks.

## 2015-10-23 NOTE — Telephone Encounter (Signed)
Advised patient as below.  

## 2015-11-16 ENCOUNTER — Ambulatory Visit: Payer: Managed Care, Other (non HMO) | Admitting: Family Medicine

## 2015-12-07 ENCOUNTER — Ambulatory Visit (INDEPENDENT_AMBULATORY_CARE_PROVIDER_SITE_OTHER): Payer: Managed Care, Other (non HMO) | Admitting: Family Medicine

## 2015-12-07 ENCOUNTER — Telehealth: Payer: Self-pay | Admitting: Family Medicine

## 2015-12-07 VITALS — BP 128/80 | HR 90 | Temp 99.7°F | Resp 16 | Wt 248.0 lb

## 2015-12-07 DIAGNOSIS — B349 Viral infection, unspecified: Secondary | ICD-10-CM | POA: Insufficient documentation

## 2015-12-07 LAB — POC INFLUENZA A&B (BINAX/QUICKVUE)
Influenza A, POC: NEGATIVE
Influenza B, POC: NEGATIVE

## 2015-12-07 MED ORDER — HYDROCODONE-HOMATROPINE 5-1.5 MG/5ML PO SYRP: ORAL_SOLUTION | ORAL | Status: DC

## 2015-12-07 NOTE — Patient Instructions (Signed)
Discussed use of Mucinex D and Delsym. Avoid further nasal decongestant spray.

## 2015-12-07 NOTE — Progress Notes (Signed)
Subjective:     Patient ID: Jodi Becker, female   DOB: October 21, 1955, 60 y.o.   MRN: [id number]  HPI  Chief Complaint  Patient presents with  . URI    SAince Monday patient with fever, cough, sore throat, congestion and sinus drainage.  States her grandchildren have been sick as well. Cough occasionally productive of clear sputum. + flu shot. Has been taking an Excedrin cold product and nasal spray for her sx.   Review of Systems     Objective:   Physical Exam  Constitutional: She appears well-developed and well-nourished. No distress.  Ears: T.M's intact without inflammation Sinuses: non-tender Throat: no tonsillar enlargement or exudate Neck: no cervical adenopathy Lungs: clear     Assessment:    1. Viral syndrome - POC Influenza A&B - HYDROcodone-homatropine (HYCODAN) 5-1.5 MG/5ML syrup; 5 ml 4-6 hours as needed for cough  Dispense: 240 mL; Refill: 0    Plan:    Discussed use of Mucinex D and Delsym.

## 2015-12-07 NOTE — Telephone Encounter (Signed)
Lab sheet for Met C and PTH.  Thanks,   -Mickel Baas

## 2015-12-08 LAB — PTH, INTACT AND CALCIUM: PTH: 29 pg/mL (ref 15–65)

## 2015-12-08 LAB — COMPREHENSIVE METABOLIC PANEL
A/G RATIO: 1.9 (ref 1.1–2.5)
ALT: 28 IU/L (ref 0–32)
AST: 18 IU/L (ref 0–40)
Albumin: 4.4 g/dL (ref 3.5–5.5)
Alkaline Phosphatase: 69 IU/L (ref 39–117)
BUN/Creatinine Ratio: 14 (ref 9–23)
BUN: 12 mg/dL (ref 6–24)
Bilirubin Total: 0.3 mg/dL (ref 0.0–1.2)
CALCIUM: 10.4 mg/dL — AB (ref 8.7–10.2)
CO2: 24 mmol/L (ref 18–29)
CREATININE: 0.84 mg/dL (ref 0.57–1.00)
Chloride: 95 mmol/L — ABNORMAL LOW (ref 96–106)
GFR, EST AFRICAN AMERICAN: 88 mL/min/{1.73_m2} (ref >59)
GFR, EST NON AFRICAN AMERICAN: 76 mL/min/{1.73_m2} (ref >59)
GLOBULIN, TOTAL: 2.3 g/dL (ref 1.5–4.5)
Glucose: 173 mg/dL — ABNORMAL HIGH (ref 65–99)
Potassium: 4.2 mmol/L (ref 3.5–5.2)
Sodium: 138 mmol/L (ref 134–144)
TOTAL PROTEIN: 6.7 g/dL (ref 6.0–8.5)

## 2015-12-10 ENCOUNTER — Telehealth: Payer: Self-pay

## 2015-12-10 NOTE — Telephone Encounter (Signed)
Pt advised.   Thanks,   -Laura  

## 2015-12-10 NOTE — Telephone Encounter (Signed)
-----   Message from Margarita Rana, MD sent at 12/08/2015  7:43 AM EST ----- Labs stable. Calcium improved some and not related to parathyroid. No calcium supplements and recheck in 3 months for stability. Thanks.

## 2016-01-08 ENCOUNTER — Other Ambulatory Visit: Payer: Self-pay | Admitting: Family Medicine

## 2016-01-08 ENCOUNTER — Telehealth: Payer: Self-pay

## 2016-01-08 DIAGNOSIS — E785 Hyperlipidemia, unspecified: Secondary | ICD-10-CM

## 2016-01-08 DIAGNOSIS — E119 Type 2 diabetes mellitus without complications: Secondary | ICD-10-CM

## 2016-01-08 DIAGNOSIS — I10 Essential (primary) hypertension: Secondary | ICD-10-CM

## 2016-01-08 MED ORDER — AMLODIPINE BESYLATE 5 MG PO TABS: 5.0000 mg | ORAL_TABLET | Freq: Every day | ORAL | Status: DC

## 2016-01-08 MED ORDER — METFORMIN HCL 500 MG PO TABS: 500.0000 mg | ORAL_TABLET | Freq: Three times a day (TID) | ORAL | Status: DC

## 2016-01-08 MED ORDER — PRAVASTATIN SODIUM 40 MG PO TABS: 40.0000 mg | ORAL_TABLET | Freq: Every day | ORAL | Status: DC

## 2016-01-08 NOTE — Telephone Encounter (Signed)
LOV for chronic problems was on 10/19/2015. [name], CMA

## 2016-01-08 NOTE — Telephone Encounter (Signed)
Opened in error. Jodi Becker, CMA  

## 2016-01-08 NOTE — Telephone Encounter (Signed)
Pt needs refill on her   amLODipine (NORVASC) 5 MG tablet metFORMIN (GLUCOPHAGE) 500 MG tablet pravastatin (PRAVACHOL) 40 MG tablet  She wants these sent to [facility] on  [address]  Her call back [phone]  Thanks Con Memos

## 2016-01-09 ENCOUNTER — Other Ambulatory Visit: Payer: Self-pay | Admitting: Family Medicine

## 2016-01-09 ENCOUNTER — Other Ambulatory Visit: Payer: Self-pay | Admitting: Physician Assistant

## 2016-01-09 DIAGNOSIS — E119 Type 2 diabetes mellitus without complications: Secondary | ICD-10-CM

## 2016-01-09 MED ORDER — GLIPIZIDE ER 2.5 MG PO TB24: 2.5000 mg | ORAL_TABLET | Freq: Every day | ORAL | Status: DC

## 2016-01-09 NOTE — Telephone Encounter (Signed)
See note. Looks like should be taking both and needs follow up ov scheduled. Thanks.

## 2016-01-09 NOTE — Telephone Encounter (Signed)
Pt needed refills on her Glipizide.  I advised her she needed to take both.   Thanks,   -Mickel Baas

## 2016-01-09 NOTE — Telephone Encounter (Signed)
Pt wants to know if she is suppose to be taking glipizide along with the metformin.  She is confused about which medications she is supposed to be on.  Can someone please call and advise her to her current medication list  Her call back is  [phone]  Thanks Con Memos

## 2016-01-14 ENCOUNTER — Encounter: Payer: Self-pay | Admitting: Family Medicine

## 2016-01-14 ENCOUNTER — Ambulatory Visit (INDEPENDENT_AMBULATORY_CARE_PROVIDER_SITE_OTHER): Payer: Managed Care, Other (non HMO) | Admitting: Family Medicine

## 2016-01-14 ENCOUNTER — Ambulatory Visit: Payer: Managed Care, Other (non HMO) | Admitting: Family Medicine

## 2016-01-14 VITALS — BP 118/72 | HR 64 | Temp 98.3°F | Resp 20 | Wt 248.0 lb

## 2016-01-14 DIAGNOSIS — E119 Type 2 diabetes mellitus without complications: Secondary | ICD-10-CM

## 2016-01-14 DIAGNOSIS — I10 Essential (primary) hypertension: Secondary | ICD-10-CM | POA: Diagnosis not present

## 2016-01-14 DIAGNOSIS — E785 Hyperlipidemia, unspecified: Secondary | ICD-10-CM

## 2016-01-14 LAB — POCT GLYCOSYLATED HEMOGLOBIN (HGB A1C)
ESTIMATED AVERAGE GLUCOSE: 157
Hemoglobin A1C: 7.1

## 2016-01-14 LAB — POCT UA - MICROALBUMIN: Microalbumin Ur, POC: 50 mg/L
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
## 2016-02-15 ENCOUNTER — Ambulatory Visit (INDEPENDENT_AMBULATORY_CARE_PROVIDER_SITE_OTHER): Payer: Managed Care, Other (non HMO) | Admitting: Family Medicine

## 2016-02-15 ENCOUNTER — Encounter: Payer: Self-pay | Admitting: Family Medicine

## 2016-02-15 VITALS — BP 116/60 | HR 87 | Temp 98.1°F | Resp 16 | Wt 250.8 lb

## 2016-02-15 DIAGNOSIS — L501 Idiopathic urticaria: Secondary | ICD-10-CM

## 2016-02-15 MED ORDER — HYDROXYZINE HCL 25 MG PO TABS: 25.0000 mg | ORAL_TABLET | Freq: Four times a day (QID) | ORAL | Status: DC | PRN

## 2016-02-15 NOTE — Patient Instructions (Addendum)
Start Claritin one pill daily. May add hydroxyzine as needed for itching. If hydroxyzine too expensive continue with Benadryl. If this is not helping call for dermatology referral.

## 2016-02-15 NOTE — Progress Notes (Signed)
Subjective:     Patient ID: Jodi Becker, female   DOB: 1956/07/26, 60 y.o.   MRN: [id number]  HPI  Chief Complaint  Patient presents with  . Pruritis    Patient comes in office today to address itching and swelling of skin on her upper extremities intermittent for the past 2 weeks. Patient reports that itching has now spread down into her inner thighs. Patient describes skin as being patchy and very itchy, patient complaints of itching only in PM before bed, patient has been taking otc Benadryl for relief.   States Benadryl relieves the itching and the bumps go away for a period of time. Denies prior similar rash. Denies undue stress but states her 36 year old son died in 12/19/2022 from being burned in a motor vehicle accident. Uses a body wash at home. Currently works in an Assisted Living facility.   Review of Systems  Respiratory: Negative for shortness of breath.        Objective:   Physical Exam  Constitutional: She appears well-developed and well-nourished. No distress.  Pulmonary/Chest: Breath sounds normal. She has no wheezes.  Skin:  Urticarial appearing lesions noted on her anterior forearms.       Assessment:    1. Idiopathic urticaria - hydrOXYzine (ATARAX/VISTARIL) 25 MG tablet; Take 1 tablet (25 mg total) by mouth every 6 (six) hours as needed. For hives  Dispense: 16 tablet; Refill: 1    Plan:    Discussed daily use of Claritin.Stop body washes. Call if not improving for referral to dermatology.

## 2016-02-20 LAB — HM DIABETES EYE EXAM

## 2016-03-05 ENCOUNTER — Ambulatory Visit: Payer: Managed Care, Other (non HMO) | Admitting: Physician Assistant

## 2016-04-10 ENCOUNTER — Other Ambulatory Visit: Payer: Self-pay | Admitting: Family Medicine

## 2016-04-10 DIAGNOSIS — I10 Essential (primary) hypertension: Secondary | ICD-10-CM

## 2016-04-10 NOTE — Telephone Encounter (Signed)
hydrochlorothiazide (HYDRODIURIL) 50 MG tablet and losartan (COZAAR) 100 MG tablet sent to [facility] on [address].

## 2016-05-20 NOTE — Telephone Encounter (Signed)
error 

## 2016-07-02 ENCOUNTER — Other Ambulatory Visit: Payer: Self-pay | Admitting: Family Medicine

## 2016-07-02 DIAGNOSIS — E119 Type 2 diabetes mellitus without complications: Secondary | ICD-10-CM

## 2016-07-22 DIAGNOSIS — Z23 Encounter for immunization: Secondary | ICD-10-CM | POA: Diagnosis not present

## 2016-08-13 ENCOUNTER — Ambulatory Visit (INDEPENDENT_AMBULATORY_CARE_PROVIDER_SITE_OTHER): Payer: Managed Care, Other (non HMO) | Admitting: Physician Assistant

## 2016-08-13 ENCOUNTER — Encounter: Payer: Self-pay | Admitting: Physician Assistant

## 2016-08-13 VITALS — BP 130/78 | HR 62 | Temp 98.4°F | Resp 14 | Wt 267.8 lb

## 2016-08-13 DIAGNOSIS — I10 Essential (primary) hypertension: Secondary | ICD-10-CM | POA: Diagnosis not present

## 2016-08-13 DIAGNOSIS — E7800 Pure hypercholesterolemia, unspecified: Secondary | ICD-10-CM | POA: Diagnosis not present

## 2016-08-13 DIAGNOSIS — E119 Type 2 diabetes mellitus without complications: Secondary | ICD-10-CM

## 2016-08-13 LAB — POCT GLYCOSYLATED HEMOGLOBIN (HGB A1C): Hemoglobin A1C: 8

## 2016-08-13 MED ORDER — GLIPIZIDE ER 5 MG PO TB24: 5.0000 mg | ORAL_TABLET | Freq: Every day | ORAL | 1 refills | Status: DC

## 2016-08-13 NOTE — Patient Instructions (Signed)

## 2016-08-13 NOTE — Progress Notes (Signed)
Patient: Jodi Becker Female    DOB: 12/10/1955   60 y.o.   MRN: [id number] Visit Date: 08/13/2016  Today's Provider: Mar Daring, PA-C   Chief Complaint  Patient presents with  . Diabetes  . Hyperlipidemia  . Hypertension  . Follow-up   Subjective:    HPI Patient is here for follow up and establish care with [facility] Malling since Dr Venia Minks left the practice. Patient is doing well on current medication regimen.   Diabetes Mellitus Type II, Follow-up:   Lab Results  Component Value Date   HGBA1C 8.0 08/13/2016   HGBA1C 7.1 01/14/2016   HGBA1C 6.9 (H) 10/19/2015   Last seen for diabetes 6 months ago.  Management since then includes continue medication. She reports excellent compliance with treatment. She is not having side effects.  Current symptoms include none and have been stable. Home blood sugar records: rarely being checked  Episodes of hypoglycemia? no   Current Insulin Regimen: none Weight trend: stable Current diet: in general, a "healthy" diet   Current exercise: walking  ------------------------------------------------------------------------   Hypertension, follow-up:  BP Readings from Last 3 Encounters:  08/13/16 130/78  02/15/16 116/60  01/14/16 118/72    She was last seen for hypertension 6 months ago.  BP at that visit was 118/72. Management since that visit includes continue medication.She reports excellent compliance with treatment. She is not having side effects.  She is exercising. She is adherent to low salt diet.   Outside blood pressures are being checked. She is experiencing none.  Patient denies chest pain, irregular heart beat and palpitations.   Cardiovascular risk factors include advanced age (older than 38 for men, 62 for women), diabetes mellitus, dyslipidemia and obesity (BMI >= 30 kg/m2).  Use of agents associated with hypertension: none.    ------------------------------------------------------------------------    Lipid/Cholesterol, Follow-up:   Last seen for this 6 months ago.  Management since that visit includes continue medication.  Last Lipid Panel:    Component Value Date/Time   CHOL 152 10/19/2015 0958   TRIG 88 10/19/2015 0958   HDL 84 10/19/2015 0958   CHOLHDL 1.8 10/19/2015 0958   LDLCALC 50 10/19/2015 0958    She reports excellent compliance with treatment. She is not having side effects.   Wt Readings from Last 3 Encounters:  08/13/16 267 lb 12.8 oz (121.5 kg)  02/15/16 250 lb 12.8 oz (113.8 kg)  01/14/16 248 lb (112.5 kg)    ------------------------------------------------------------------------    Previous Medications   AMLODIPINE (NORVASC) 5 MG TABLET    Take 1 tablet (5 mg total) by mouth daily.   ASPIRIN 81 MG CHEWABLE TABLET    Chew by mouth.   CHOLECALCIFEROL (VITAMIN D3) 1000 UNITS CAPS    Take by mouth.   HYDROCHLOROTHIAZIDE (HYDRODIURIL) 50 MG TABLET    TAKE ONE TABLET BY MOUTH ONCE DAILY   HYDROXYZINE (ATARAX/VISTARIL) 25 MG TABLET    Take 1 tablet (25 mg total) by mouth every 6 (six) hours as needed. For hives   LOSARTAN (COZAAR) 100 MG TABLET    TAKE ONE TABLET BY MOUTH ONCE DAILY   MAGNESIUM 250 MG TABS    Take by mouth.   METFORMIN (GLUCOPHAGE) 500 MG TABLET    Take 1 tablet (500 mg total) by mouth 3 (three) times daily.   PRAVASTATIN (PRAVACHOL) 40 MG TABLET    Take 1 tablet (40 mg total) by mouth daily.    Review of Systems  Constitutional: Negative.   Respiratory: Negative.  Cardiovascular: Negative.   Gastrointestinal: Negative.   Endocrine: Negative.   Musculoskeletal: Negative.     Social History  Substance Use Topics  . Smoking status: Never Smoker  . Smokeless tobacco: Never Used  . Alcohol use Yes     Comment: Occasionally   Objective:   BP 130/78 (BP Location: Right Arm, Patient Position: Sitting, Cuff Size: Large)   Pulse 62   Temp 98.4 F (36.9  C) (Oral)   Resp 14   Wt 267 lb 12.8 oz (121.5 kg)   BMI 44.56 kg/m   Physical Exam  Constitutional: She appears well-developed and well-nourished. No distress.  Neck: Normal range of motion. Neck supple.  Cardiovascular: Normal rate, regular rhythm and normal heart sounds.  Exam reveals no gallop and no friction rub.   No murmur heard. Pulmonary/Chest: Effort normal and breath sounds normal. No respiratory distress. She has no wheezes. She has no rales.  Musculoskeletal: She exhibits no edema.  Skin: She is not diaphoretic.  Vitals reviewed.     Assessment & Plan:     1. Type 2 diabetes mellitus without complication, without long-term current use of insulin (HCC) A1c increased to 8.0. Discussed sticking to lifestyle modifications and limiting carbs and sugars. Will increase glipizide to 5mg  daily. I will see her back in 3 months and we will recheck all labs that day.  - POCT HgB A1C - glipiZIDE (GLUCOTROL XL) 5 MG 24 hr tablet; Take 1 tablet (5 mg total) by mouth daily with breakfast.  Dispense: 90 tablet; Refill: 1  2. Benign hypertension Stable.   3. Pure hypercholesterolemia Stable. Will check labs in 3 months.   Follow up: Return in about 3 months (around 11/13/2016) for T2DM, HTN, lipids.

## 2016-10-01 ENCOUNTER — Other Ambulatory Visit: Payer: Self-pay | Admitting: Family Medicine

## 2016-10-01 DIAGNOSIS — I10 Essential (primary) hypertension: Secondary | ICD-10-CM

## 2016-10-01 NOTE — Telephone Encounter (Signed)
Last ov 08/13/16 Last filled 04/10/16. Please review. Thank you. sd

## 2016-11-08 ENCOUNTER — Ambulatory Visit (INDEPENDENT_AMBULATORY_CARE_PROVIDER_SITE_OTHER): Payer: Managed Care, Other (non HMO) | Admitting: Family Medicine

## 2016-11-08 ENCOUNTER — Encounter: Payer: Self-pay | Admitting: Family Medicine

## 2016-11-08 VITALS — BP 122/82 | HR 80 | Temp 98.2°F | Resp 18 | Wt 274.0 lb

## 2016-11-08 DIAGNOSIS — J069 Acute upper respiratory infection, unspecified: Secondary | ICD-10-CM | POA: Diagnosis not present

## 2016-11-08 DIAGNOSIS — B9789 Other viral agents as the cause of diseases classified elsewhere: Secondary | ICD-10-CM | POA: Diagnosis not present

## 2016-11-08 NOTE — Patient Instructions (Signed)
Discussed use of Mucinex D and Delsym. Let me know if not improving over the next week.

## 2016-11-08 NOTE — Progress Notes (Signed)
Subjective:     Patient ID: Jodi Becker, female   DOB: [birth year], 61 y.o.   MRN: [id number]  HPI  Chief Complaint  Patient presents with  . Cough    x 10 days  Reports cold sx with clear sinus drainage with PND and minimally productive cough. No fever, chills, or body aches.   Review of Systems     Objective:   Physical Exam  Constitutional: She appears well-developed and well-nourished. No distress.  Ears: T.M's intact without inflammation Sinuses: non-tender Throat: no tonsillar enlargement or exudate Neck: no cervical adenopathy Lungs: clear     Assessment:    1. Viral upper respiratory tract infection     Plan:    Discussed further otc treatment with Mucinex D and Delsym.

## 2016-11-11 ENCOUNTER — Telehealth: Payer: Self-pay | Admitting: Family Medicine

## 2016-11-11 ENCOUNTER — Other Ambulatory Visit: Payer: Self-pay | Admitting: Family Medicine

## 2016-11-11 DIAGNOSIS — J019 Acute sinusitis, unspecified: Secondary | ICD-10-CM

## 2016-11-11 MED ORDER — AMOXICILLIN-POT CLAVULANATE 875-125 MG PO TABS: 1.0000 | ORAL_TABLET | Freq: Two times a day (BID) | ORAL | 0 refills | Status: DC

## 2016-11-11 NOTE — Telephone Encounter (Signed)
States sinus drainage is thicker and not improving. Cough has improved. Day #13 will cover for sinusitis with Augmentin.

## 2016-11-11 NOTE — Telephone Encounter (Signed)
Pt stated that she was seen on 11/08/16 and her symptoms haven't improved and the cough medication hasn't helped with her cough. Pt is requesting an antibiotic be sent to [place]. Please advise. Thanks TNP

## 2016-11-11 NOTE — Telephone Encounter (Signed)
Please review note and advise. KW 

## 2016-11-13 ENCOUNTER — Ambulatory Visit (INDEPENDENT_AMBULATORY_CARE_PROVIDER_SITE_OTHER): Payer: Managed Care, Other (non HMO) | Admitting: Physician Assistant

## 2016-11-13 ENCOUNTER — Encounter: Payer: Self-pay | Admitting: Physician Assistant

## 2016-11-13 VITALS — BP 122/86 | HR 72 | Temp 98.3°F | Resp 16 | Wt 273.0 lb

## 2016-11-13 DIAGNOSIS — E7800 Pure hypercholesterolemia, unspecified: Secondary | ICD-10-CM | POA: Diagnosis not present

## 2016-11-13 DIAGNOSIS — E1165 Type 2 diabetes mellitus with hyperglycemia: Secondary | ICD-10-CM

## 2016-11-13 DIAGNOSIS — IMO0001 Reserved for inherently not codable concepts without codable children: Secondary | ICD-10-CM

## 2016-11-13 DIAGNOSIS — I10 Essential (primary) hypertension: Secondary | ICD-10-CM | POA: Diagnosis not present

## 2016-11-13 DIAGNOSIS — E119 Type 2 diabetes mellitus without complications: Secondary | ICD-10-CM | POA: Insufficient documentation

## 2016-11-13 LAB — POCT GLYCOSYLATED HEMOGLOBIN (HGB A1C)
ESTIMATED AVERAGE GLUCOSE: 194
Hemoglobin A1C: 8.4

## 2016-11-13 MED ORDER — GLIPIZIDE ER 10 MG PO TB24: 10.0000 mg | ORAL_TABLET | Freq: Every day | ORAL | 3 refills | Status: DC

## 2016-11-13 MED ORDER — METFORMIN HCL 500 MG PO TABS: 500.0000 mg | ORAL_TABLET | Freq: Two times a day (BID) | ORAL | 3 refills | Status: DC

## 2016-11-13 NOTE — Progress Notes (Signed)
Patient: Jodi Becker Female    DOB: [birth year]   62 y.o.   MRN: [id number] Visit Date: 11/13/2016  Today's Provider: Mar Daring, PA-C   Chief Complaint  Patient presents with  . Diabetes  . Hypertension   Subjective:    HPI  Diabetes Mellitus Type II, Follow-up:   Lab Results  Component Value Date   HGBA1C 8.4 11/13/2016   HGBA1C 8.0 08/13/2016   HGBA1C 7.1 01/14/2016    Last seen for diabetes 3 months ago.  Management since then includes increase glipizide to 5 mg. She reports excellent compliance with treatment. She is not having side effects.  Current symptoms include none and have been stable. Home blood sugar records: pt reports she does not check her blood sugar.  Episodes of hypoglycemia? no   Current Insulin Regimen: none Most Recent Eye Exam: UTD Weight trend: stable Prior visit with dietician: no Current diet: in general, an "unhealthy" diet Current exercise: walking at work   Pertinent Labs:    Component Value Date/Time   CHOL 152 10/19/2015 0958   TRIG 88 10/19/2015 0958   HDL 84 10/19/2015 0958   LDLCALC 50 10/19/2015 0958   CREATININE 0.84 12/07/2015 0847    Wt Readings from Last 3 Encounters:  11/13/16 273 lb (123.8 kg)  11/08/16 274 lb (124.3 kg)  08/13/16 267 lb 12.8 oz (121.5 kg)   ------------------------------------------------------------------------   Hypertension, follow-up:  BP Readings from Last 3 Encounters:  11/13/16 122/86  11/08/16 122/82  08/13/16 130/78    She was last seen for hypertension 3 months ago.  BP at that visit was 122/82. Management changes since that visit include no changes. She reports excellent compliance with treatment. She is not having side effects.  She is exercising. She is not adherent to low salt diet.   Outside blood pressures are stable. She is experiencing none.  Patient denies chest pain and lower extremity edema.   Cardiovascular risk factors include diabetes  mellitus, hypertension and obesity (BMI >= 30 kg/m2).  Use of agents associated with hypertension: none.   Weight trend: stable Wt Readings from Last 3 Encounters:  11/13/16 273 lb (123.8 kg)  11/08/16 274 lb (124.3 kg)  08/13/16 267 lb 12.8 oz (121.5 kg)   Current diet: in general, a "healthy" diet   ------------------------------------------------------------------------  Patient reports that she has been feeling need to eat all the time after getting home from work. Patient reports she works second shift at [employer]. Patient reports that in March of last year her 33 y/o son died in a MVA.     Allergies  Allergen Reactions  . Sulfa Antibiotics    Patient Active Problem List   Diagnosis Date Noted  . Diabetes mellitus type 2, controlled, without complications ([place]) [id number]  . Allergic rhinitis 07/09/2015  . Eustachian tube dysfunction 07/09/2015  . Abnormal ECG 04/25/2015  . Cancer of colon ([place]) 04/25/2015  . Diastolic dysfunction [id number]  . Generalized headache 04/25/2015  . Cannot sleep 04/25/2015  . 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 Hyperlipemia 04/20/2015  . Anterior tibial tendonitis 05/05/2014  . Well controlled diabetes mellitus ([place]) 05/24/2009  . Benign hypertension 05/24/2009     Current Outpatient Prescriptions:  .  amLODipine (NORVASC) 5 MG tablet, Take 1 tablet (5 mg total) by mouth daily., Disp: 90 tablet, Rfl: 3 .  amoxicillin-clavulanate (AUGMENTIN) 875-125 MG tablet, Take 1 tablet by mouth 2 (two) times daily., Disp: 20 tablet, Rfl: 0 .  aspirin 81 MG chewable tablet, Chew by mouth., Disp: , Rfl:  .  Cholecalciferol (VITAMIN D3) 1000 UNITS CAPS, Take by mouth., Disp: , Rfl:  .  glipiZIDE (GLUCOTROL XL) 5 MG 24 hr tablet, Take 1 tablet (5 mg total) by mouth daily with breakfast., Disp: 90 tablet, Rfl: 1 .  hydrochlorothiazide (HYDRODIURIL) 50 MG tablet, TAKE ONE TABLET BY MOUTH ONCE DAILY, Disp: 90 tablet, Rfl: 1 .  hydrOXYzine  (ATARAX/VISTARIL) 25 MG tablet, Take 1 tablet (25 mg total) by mouth every 6 (six) hours as needed. For hives, Disp: 16 tablet, Rfl: 1 .  losartan (COZAAR) 100 MG tablet, TAKE ONE TABLET BY MOUTH ONCE DAILY, Disp: 90 tablet, Rfl: 1 .  metFORMIN (GLUCOPHAGE) 500 MG tablet, Take 1 tablet (500 mg total) by mouth 3 (three) times daily., Disp: 270 tablet, Rfl: 3 .  pravastatin (PRAVACHOL) 40 MG tablet, Take 1 tablet (40 mg total) by mouth daily., Disp: 90 tablet, Rfl: 3  Review of Systems  Constitutional: Negative.   HENT: Positive for congestion.   Respiratory: Positive for cough. Negative for chest tightness, shortness of breath and wheezing.   Cardiovascular: Negative.   Gastrointestinal: Negative for abdominal pain.  Endocrine: Positive for polyphagia.  Musculoskeletal: Positive for arthralgias (knee).  Neurological: Negative for dizziness, weakness, numbness and headaches.    Social History  Substance Use Topics  . Smoking status: Never Smoker  . Smokeless tobacco: Never Used  . Alcohol use Yes     Comment: Occasionally   Objective:   BP 122/86 (BP Location: Left Arm, Patient Position: Sitting, Cuff Size: Large)   Pulse 72   Temp 98.3 F (36.8 C) (Oral)   Resp 16   Wt 273 lb (123.8 kg)   BMI 45.43 kg/m   Physical Exam  Constitutional: She appears well-developed and well-nourished. No distress.  Neck: Normal range of motion. Neck supple. No JVD present. No tracheal deviation present. No thyromegaly present.  Cardiovascular: Normal rate, regular rhythm and normal heart sounds.  Exam reveals no gallop and no friction rub.   No murmur heard. Pulmonary/Chest: Effort normal and breath sounds normal. No respiratory distress. She has no wheezes. She has no rales.  Lymphadenopathy:    She has no cervical adenopathy.  Skin: She is not diaphoretic.  Vitals reviewed.  Diabetic Foot Exam - Simple   Simple Foot Form Diabetic Foot exam was performed with the following findings:  Yes  11/13/2016  8:30 AM  Visual Inspection No deformities, no ulcerations, no other skin breakdown bilaterally:  Yes Sensation Testing Intact to touch and monofilament testing bilaterally:  Yes Pulse Check Posterior Tibialis and Dorsalis pulse intact bilaterally:  Yes Comments        Assessment & Plan:     1. Uncontrolled type 2 diabetes mellitus without complication, without long-term current use of insulin (HCC) Worsening and now uncontrolled T2DM. Will increase metformin to 1000mg  BID and increase Glipizide to 10mg  daily. I will check other labs as below. Discussed referral to Diabetic education and nutrition counseling but patient refuses, states "It will be expensive because I have terrible insurance." Tried to reassure but patient still declined. I will see her back in 3 months to recheck. Consider adding Jardiance if still uncontrolled.  - POCT glycosylated hemoglobin (Hb A1C) - metFORMIN (GLUCOPHAGE) 500 MG tablet; Take 1 tablet (500 mg total) by mouth 2 (two) times daily with a meal.  Dispense: 360 tablet; Refill: 3 - glipiZIDE (GLUCOTROL XL) 10 MG 24 hr tablet; Take 1 tablet (  10 mg total) by mouth daily with breakfast.  Dispense: 90 tablet; Refill: 3 - Comprehensive Metabolic Panel (CMET)  2. Benign hypertension Stable. Continue losartan 100mg  and amlodipine 5mg . Will check labs as below and f/u pending results. - CBC w/Diff/Platelet - Comprehensive Metabolic Panel (CMET)  3. Pure hypercholesterolemia Stable. Continue pravastatin 40mg . Will check labs as below and f/u pending results. - Comprehensive Metabolic Panel (CMET) - Lipid Profile       Mar Daring, PA-C  [facility]

## 2016-11-13 NOTE — Patient Instructions (Signed)
Carbohydrate Counting for Diabetes Mellitus, Adult Carbohydrate counting is a method for keeping track of how many carbohydrates you eat. Eating carbohydrates naturally increases the amount of sugar (glucose) in the blood. Counting how many carbohydrates you eat helps keep your blood glucose within normal limits, which helps you manage your diabetes (diabetes mellitus). It is important to know how many carbohydrates you can safely have in each meal. This is different for every person. A diet and nutrition specialist (registered dietitian) can help you make a meal plan and calculate how many carbohydrates you should have at each meal and snack. Carbohydrates are found in the following foods:  Grains, such as breads and cereals.  Dried beans and soy products.  Starchy vegetables, such as potatoes, peas, and corn.  Fruit and fruit juices.  Milk and yogurt.  Sweets and snack foods, such as cake, cookies, candy, chips, and soft drinks. How do I count carbohydrates? There are two ways to count carbohydrates in food. You can use either of the methods or a combination of both. Reading "Nutrition Facts" on packaged food  The "Nutrition Facts" list is included on the labels of almost all packaged foods and beverages in the [place] It includes:  The serving size.  Information about nutrients in each serving, including the grams (g) of carbohydrate per serving. To use the "Nutrition Facts":  Decide how many servings you will have.  Multiply the number of servings by the number of carbohydrates per serving.  The resulting number is the total amount of carbohydrates that you will be having. Learning standard serving sizes of other foods  When you eat foods containing carbohydrates that are not packaged or do not include "Nutrition Facts" on the label, you need to measure the servings in order to count the amount of carbohydrates:  Measure the foods that you will eat with a food scale or measuring  cup, if needed.  Decide how many standard-size servings you will eat.  Multiply the number of servings by 15. Most carbohydrate-rich foods have about 15 g of carbohydrates per serving.  For example, if you eat 8 oz (170 g) of strawberries, you will have eaten 2 servings and 30 g of carbohydrates (2 servings x 15 g = 30 g).  For foods that have more than one food mixed, such as soups and casseroles, you must count the carbohydrates in each food that is included. The following list contains standard serving sizes of common carbohydrate-rich foods. Each of these servings has about 15 g of carbohydrates:   hamburger bun or  English muffin.   oz (15 mL) syrup.   oz (14 g) jelly.  1 slice of bread.  1 six-inch tortilla.  3 oz (85 g) cooked rice or pasta.  4 oz (113 g) cooked dried beans.  4 oz (113 g) starchy vegetable, such as peas, corn, or potatoes.  4 oz (113 g) hot cereal.  4 oz (113 g) mashed potatoes or  of a large baked potato.  4 oz (113 g) canned or frozen fruit.  4 oz (120 mL) fruit juice.  4-6 crackers.  6 chicken nuggets.  6 oz (170 g) unsweetened dry cereal.  6 oz (170 g) plain fat-free yogurt or yogurt sweetened with artificial sweeteners.  8 oz (240 mL) milk.  8 oz (170 g) fresh fruit or one small piece of fruit.  24 oz (680 g) popped popcorn. Example of carbohydrate counting Sample meal  3 oz (85 g) chicken breast.  6 oz (  170 g) brown rice.  4 oz (113 g) corn.  8 oz (240 mL) milk.  8 oz (170 g) strawberries with sugar-free whipped topping. Carbohydrate calculation 1. Identify the foods that contain carbohydrates:  Rice.  Corn.  Milk.  Strawberries. 2. Calculate how many servings you have of each food:  2 servings rice.  1 serving corn.  1 serving milk.  1 serving strawberries. 3. Multiply each number of servings by 15 g:  2 servings rice x 15 g = 30 g.  1 serving corn x 15 g = 15 g.  1 serving milk x 15 g = 15  g.  1 serving strawberries x 15 g = 15 g. 4. Add together all of the amounts to find the total grams of carbohydrates eaten:  30 g + 15 g + 15 g + 15 g = 75 g of carbohydrates total. This information is not intended to replace advice given to you by your health care provider. Make sure you discuss any questions you have with your health care provider. Document Released: 09/29/2005 Document Revised: 04/18/2016 Document Reviewed: 03/12/2016 Elsevier Interactive Patient Education  2017 [employer]  

## 2016-11-14 LAB — COMPREHENSIVE METABOLIC PANEL
A/G RATIO: 1.8 (ref 1.2–2.2)
ALK PHOS: 120 IU/L — AB (ref 39–117)
ALT: 44 IU/L — ABNORMAL HIGH (ref 0–32)
AST: 25 IU/L (ref 0–40)
Albumin: 4.5 g/dL (ref 3.6–4.8)
BILIRUBIN TOTAL: 0.2 mg/dL (ref 0.0–1.2)
BUN/Creatinine Ratio: 19 (ref 12–28)
BUN: 15 mg/dL (ref 8–27)
CHLORIDE: 98 mmol/L (ref 96–106)
CO2: 27 mmol/L (ref 18–29)
Calcium: 10.6 mg/dL — ABNORMAL HIGH (ref 8.7–10.3)
Creatinine, Ser: 0.77 mg/dL (ref 0.57–1.00)
GFR calc Af Amer: 97 mL/min/{1.73_m2} (ref >59)
GFR calc non Af Amer: 84 mL/min/{1.73_m2} (ref >59)
GLUCOSE: 208 mg/dL — AB (ref 65–99)
Globulin, Total: 2.5 g/dL (ref 1.5–4.5)
POTASSIUM: 4.6 mmol/L (ref 3.5–5.2)
SODIUM: 140 mmol/L (ref 134–144)
Total Protein: 7 g/dL (ref 6.0–8.5)

## 2016-11-14 LAB — CBC WITH DIFFERENTIAL/PLATELET
BASOS ABS: 0 10*3/uL (ref 0.0–0.2)
Basos: 0 %
EOS (ABSOLUTE): 0.2 10*3/uL (ref 0.0–0.4)
Eos: 3 %
HEMOGLOBIN: 13.4 g/dL (ref 11.1–15.9)
Hematocrit: 40.7 % (ref 34.0–46.6)
Immature Grans (Abs): 0 10*3/uL (ref 0.0–0.1)
Immature Granulocytes: 0 %
LYMPHS ABS: 1.8 10*3/uL (ref 0.7–3.1)
Lymphs: 31 %
MCH: 27.5 pg (ref 26.6–33.0)
MCHC: 32.9 g/dL (ref 31.5–35.7)
MCV: 83 fL (ref 79–97)
MONOCYTES: 8 %
Monocytes Absolute: 0.5 10*3/uL (ref 0.1–0.9)
NEUTROS ABS: 3.3 10*3/uL (ref 1.4–7.0)
Neutrophils: 58 %
Platelets: 221 10*3/uL (ref 150–379)
RBC: 4.88 x10E6/uL (ref 3.77–5.28)
RDW: 15 % (ref 12.3–15.4)
WBC: 5.8 10*3/uL (ref 3.4–10.8)

## 2016-11-14 LAB — LIPID PANEL
CHOLESTEROL TOTAL: 137 mg/dL (ref 100–199)
Chol/HDL Ratio: 2.3 ratio units (ref 0.0–4.4)
HDL: 59 mg/dL (ref >39)
LDL Calculated: 59 mg/dL (ref 0–99)
Triglycerides: 95 mg/dL (ref 0–149)
VLDL CHOLESTEROL CAL: 19 mg/dL (ref 5–40)

## 2016-12-12 ENCOUNTER — Other Ambulatory Visit: Payer: Self-pay | Admitting: Physician Assistant

## 2016-12-12 DIAGNOSIS — E119 Type 2 diabetes mellitus without complications: Secondary | ICD-10-CM

## 2016-12-15 ENCOUNTER — Other Ambulatory Visit: Payer: Self-pay | Admitting: Physician Assistant

## 2016-12-15 DIAGNOSIS — I10 Essential (primary) hypertension: Secondary | ICD-10-CM

## 2016-12-15 DIAGNOSIS — E119 Type 2 diabetes mellitus without complications: Secondary | ICD-10-CM

## 2016-12-15 MED ORDER — AMLODIPINE BESYLATE 5 MG PO TABS: 5.0000 mg | ORAL_TABLET | Freq: Every day | ORAL | 1 refills | Status: DC

## 2016-12-15 NOTE — Telephone Encounter (Signed)
Last ov 11/13/16 Last filled 01/08/16. Per last office note pt to continue amlodipine 5 mg qd. Glipizide was increased to 10 mg. Please review. Thank you. sd

## 2016-12-15 NOTE — Telephone Encounter (Signed)
Last ov 11/13/16 Glipizide was increased to 10 mg qd, new prescription was sent in with 3 refills. Please review. Thank you. sd

## 2016-12-15 NOTE — Telephone Encounter (Signed)
Pt contacted office for refill request on the following medications:  amLODipine (NORVASC) 5 MG tablet.  [facility]  CB#[phone]/MW  Pt states this should be changed to 10mg /MW

## 2017-01-11 ENCOUNTER — Other Ambulatory Visit: Payer: Self-pay | Admitting: Physician Assistant

## 2017-01-11 DIAGNOSIS — E119 Type 2 diabetes mellitus without complications: Secondary | ICD-10-CM

## 2017-02-06 ENCOUNTER — Other Ambulatory Visit: Payer: Self-pay | Admitting: Physician Assistant

## 2017-02-06 DIAGNOSIS — E785 Hyperlipidemia, unspecified: Secondary | ICD-10-CM

## 2017-02-10 ENCOUNTER — Ambulatory Visit: Payer: Managed Care, Other (non HMO) | Admitting: Physician Assistant

## 2017-03-13 ENCOUNTER — Encounter: Payer: Self-pay | Admitting: Physician Assistant

## 2017-03-13 ENCOUNTER — Ambulatory Visit (INDEPENDENT_AMBULATORY_CARE_PROVIDER_SITE_OTHER): Payer: Managed Care, Other (non HMO) | Admitting: Physician Assistant

## 2017-03-13 VITALS — BP 130/80 | HR 67 | Temp 98.2°F | Resp 16 | Wt 270.4 lb

## 2017-03-13 DIAGNOSIS — Z6841 Body Mass Index (BMI) 40.0 and over, adult: Secondary | ICD-10-CM

## 2017-03-13 DIAGNOSIS — R8290 Unspecified abnormal findings in urine: Secondary | ICD-10-CM

## 2017-03-13 DIAGNOSIS — E119 Type 2 diabetes mellitus without complications: Secondary | ICD-10-CM | POA: Diagnosis not present

## 2017-03-13 DIAGNOSIS — I10 Essential (primary) hypertension: Secondary | ICD-10-CM | POA: Diagnosis not present

## 2017-03-13 DIAGNOSIS — E7800 Pure hypercholesterolemia, unspecified: Secondary | ICD-10-CM | POA: Diagnosis not present

## 2017-03-13 LAB — POCT URINALYSIS DIPSTICK
BILIRUBIN UA: NEGATIVE
Blood, UA: NEGATIVE
Glucose, UA: NEGATIVE
KETONES UA: NEGATIVE
Leukocytes, UA: NEGATIVE
Nitrite, UA: NEGATIVE
PH UA: 6 (ref 5.0–8.0)
PROTEIN UA: NEGATIVE
Urobilinogen, UA: 0.2 E.U./dL

## 2017-03-13 LAB — POCT UA - MICROALBUMIN: MICROALBUMIN (UR) POC: 50 mg/L

## 2017-03-13 LAB — POCT GLYCOSYLATED HEMOGLOBIN (HGB A1C)
ESTIMATED AVERAGE GLUCOSE: 189
HEMOGLOBIN A1C: 8.2

## 2017-03-13 MED ORDER — LOSARTAN POTASSIUM 100 MG PO TABS: 100.0000 mg | ORAL_TABLET | Freq: Every day | ORAL | 1 refills | Status: DC

## 2017-03-13 MED ORDER — HYDROCHLOROTHIAZIDE 50 MG PO TABS: 50.0000 mg | ORAL_TABLET | Freq: Every day | ORAL | 1 refills | Status: DC

## 2017-03-13 NOTE — Patient Instructions (Signed)

## 2017-03-13 NOTE — Progress Notes (Signed)
Patient: Jodi Becker Female    DOB: August 03, 1956   61 y.o.   MRN: [id number] Visit Date: 03/13/2017  Today's Provider: Mar Daring, PA-C   Chief Complaint  Patient presents with  . Follow-up    Diabetes, HTN,Hyperlipidemia,and needs BMI plan   Subjective:    HPI Patient here today for 3 months follow-up T2DM, HTN,and Hyperlipidemia. Patient also needs BMI plan.   Diabetes Mellitus Type II, Follow-up:   Lab Results  Component Value Date   HGBA1C 8.2 03/13/2017   HGBA1C 8.4 11/13/2016   HGBA1C 8.0 08/13/2016   Last seen for diabetes 3 months ago.  Management since then includes Increased Metformin to 1000 mg BID,Increased Glipizide to 10 mg daily. Discussed eferral to Diabetic education and nutrition counseling pt refused. She reports excellent compliance with treatment. She is not having side effects.  Current symptoms include none and have been stable. Home blood sugar records: pt does not check her blood sugar  Episodes of hypoglycemia? no   Current Insulin Regimen: none Most Recent Eye Exam: UTD Weight trend: stable Prior visit with dietician: no Current diet: in general, an "unhealthy" diet Current exercise: walking  ------------------------------------------------------------------------   Hypertension, follow-up:  BP Readings from Last 3 Encounters:  03/13/17 130/80  11/13/16 122/86  11/08/16 122/82    She was last seen for hypertension 3 months ago.  BP at that visit was 122/86. Management since that visit includes continue current medication.She reports excellent compliance with treatment. She is not having side effects.  She is exercising. She is not adherent to low salt diet.   Outside blood pressures are stable. She is experiencing none. She reports that her legs do get swollen but it is because she walks all day at work. Patient denies chest pain, chest pressure/discomfort, exertional chest pressure/discomfort, fatigue,  irregular heart beat, lower extremity edema, near-syncope and palpitations.   Cardiovascular risk factors include diabetes mellitus, dyslipidemia, hypertension and obesity (BMI >= 30 kg/m2).   ------------------------------------------------------------------------    Lipid/Cholesterol, Follow-up:   Last seen for this 3 months ago.  Management since that visit includes continue current medication.  Last Lipid Panel:    Component Value Date/Time   CHOL 137 11/13/2016 0923   TRIG 95 11/13/2016 0923   HDL 59 11/13/2016 0923   CHOLHDL 2.3 11/13/2016 0923   LDLCALC 59 11/13/2016 0923    She reports excellent compliance with treatment. She is not having side effects.   Wt Readings from Last 3 Encounters:  03/13/17 270 lb 6.4 oz (122.7 kg)  11/13/16 273 lb (123.8 kg)  11/08/16 274 lb (124.3 kg)    ------------------------------------------------------------------------ She is also concern that her urine has a bad odor. No Dysuria or Hematuria.     Allergies  Allergen Reactions  . Sulfa Antibiotics      Current Outpatient Prescriptions:  .  amLODipine (NORVASC) 5 MG tablet, Take 1 tablet (5 mg total) by mouth daily., Disp: 90 tablet, Rfl: 1 .  aspirin 81 MG chewable tablet, Chew by mouth., Disp: , Rfl:  .  Cholecalciferol (VITAMIN D3) 1000 UNITS CAPS, Take by mouth., Disp: , Rfl:  .  glipiZIDE (GLUCOTROL XL) 10 MG 24 hr tablet, Take 1 tablet (10 mg total) by mouth daily with breakfast., Disp: 90 tablet, Rfl: 3 .  hydrochlorothiazide (HYDRODIURIL) 50 MG tablet, TAKE ONE TABLET BY MOUTH ONCE DAILY, Disp: 90 tablet, Rfl: 1 .  hydrOXYzine (ATARAX/VISTARIL) 25 MG tablet, Take 1 tablet (25 mg total) by  mouth every 6 (six) hours as needed. For hives, Disp: 16 tablet, Rfl: 1 .  losartan (COZAAR) 100 MG tablet, TAKE ONE TABLET BY MOUTH ONCE DAILY, Disp: 90 tablet, Rfl: 1 .  metFORMIN (GLUCOPHAGE) 500 MG tablet, TAKE ONE TABLET BY MOUTH THREE TIMES DAILY, Disp: 270 tablet, Rfl: 3 .   pravastatin (PRAVACHOL) 40 MG tablet, TAKE ONE TABLET BY MOUTH ONCE DAILY, Disp: 90 tablet, Rfl: 3 .  amoxicillin-clavulanate (AUGMENTIN) 875-125 MG tablet, Take 1 tablet by mouth 2 (two) times daily. (Patient not taking: Reported on 03/13/2017), Disp: 20 tablet, Rfl: 0  Review of Systems  Constitutional: Negative for fatigue.  Eyes: Negative for visual disturbance.  Cardiovascular: Positive for leg swelling (chronic). Negative for chest pain and palpitations.  Gastrointestinal: Negative for abdominal pain.  Endocrine: Negative for polydipsia, polyphagia and polyuria.  Genitourinary: Negative for dysuria.       Bad odor of urine; no other symptoms  Neurological: Negative for dizziness, light-headedness and headaches.    Social History  Substance Use Topics  . Smoking status: Never Smoker  . Smokeless tobacco: Never Used  . Alcohol use Yes     Comment: Occasionally   Objective:   BP 130/80 (BP Location: Right Arm, Patient Position: Sitting, Cuff Size: Normal)   Pulse 67   Temp 98.2 F (36.8 C) (Oral)   Resp 16   Wt 270 lb 6.4 oz (122.7 kg)   SpO2 97%   BMI 45.00 kg/m    Physical Exam  Constitutional: She appears well-developed and well-nourished. No distress.  Neck: Normal range of motion. Neck supple.  Cardiovascular: Normal rate, regular rhythm and normal heart sounds.  Exam reveals no gallop and no friction rub.   No murmur heard. Pulmonary/Chest: Effort normal and breath sounds normal. No respiratory distress. She has no wheezes. She has no rales.  Abdominal: Soft. Bowel sounds are normal. There is no tenderness. There is no CVA tenderness.  Skin: She is not diaphoretic.  Vitals reviewed.      Assessment & Plan:     1. Controlled type 2 diabetes mellitus without complication, unspecified whether long term insulin use (HCC) A1c improved to 8.2 from 8.4. Discussed dietary habits. Increase exercise. Continue metformin 1000mg  BID and glipizide ER 10mg  daily. Microalbumin  stable at 50.  - POCT glycosylated hemoglobin (Hb A1C) - POCT UA - Microalbumin  2. Benign hypertension Stable. Diagnosis pulled for medication refill. Continue current medical treatment plan. - losartan (COZAAR) 100 MG tablet; Take 1 tablet (100 mg total) by mouth daily.  Dispense: 90 tablet; Refill: 1 - hydrochlorothiazide (HYDRODIURIL) 50 MG tablet; Take 1 tablet (50 mg total) by mouth daily.  Dispense: 90 tablet; Refill: 1  3. Pure hypercholesterolemia Stable. Continue pravastatin 40mg .   4. Abnormal urine odor Urinalysis in the office today was normal with exception of dehydration signs. Push fluids.  - POCT urinalysis dipstick  5. Class 3 severe obesity due to excess calories with serious comorbidity and body mass index (BMI) of 45.0 to 49.9 in adult [facility]) Counseled patient on healthy lifestyle modifications including dieting and exercise.        Mar Daring, PA-C  [facility]

## 2017-08-06 LAB — HM DIABETES EYE EXAM

## 2017-08-26 ENCOUNTER — Ambulatory Visit: Payer: 59 | Admitting: Physician Assistant

## 2017-08-26 ENCOUNTER — Encounter: Payer: Self-pay | Admitting: Physician Assistant

## 2017-08-26 VITALS — BP 130/82 | HR 86 | Temp 98.3°F | Resp 16 | Wt 268.6 lb

## 2017-08-26 DIAGNOSIS — J069 Acute upper respiratory infection, unspecified: Secondary | ICD-10-CM

## 2017-08-26 MED ORDER — AMOXICILLIN-POT CLAVULANATE 875-125 MG PO TABS: 1.0000 | ORAL_TABLET | Freq: Two times a day (BID) | ORAL | 0 refills | Status: DC

## 2017-08-26 NOTE — Progress Notes (Signed)
Patient: Jodi Becker Female    DOB: [birth year]   61 y.o.   MRN: [id number] Visit Date: 08/26/2017  Today's Provider: Mar Daring, PA-C   Chief Complaint  Patient presents with  . URI   Subjective:    URI   This is a new problem. The current episode started 1 to 4 weeks ago. The problem has been gradually worsening. There has been no fever. Associated symptoms include congestion, coughing (started this morning), rhinorrhea, sinus pain, sneezing, a sore throat and wheezing. Pertinent negatives include no chest pain, ear pain, headaches or plugged ear sensation. She has tried antihistamine for the symptoms. The treatment provided no relief.       Allergies  Allergen Reactions  . Sulfa Antibiotics      Current Outpatient Medications:  .  amLODipine (NORVASC) 5 MG tablet, Take 1 tablet (5 mg total) by mouth daily., Disp: 90 tablet, Rfl: 1 .  aspirin 81 MG chewable tablet, Chew by mouth., Disp: , Rfl:  .  Cholecalciferol (VITAMIN D3) 1000 UNITS CAPS, Take by mouth., Disp: , Rfl:  .  glipiZIDE (GLUCOTROL XL) 10 MG 24 hr tablet, Take 1 tablet (10 mg total) by mouth daily with breakfast., Disp: 90 tablet, Rfl: 3 .  hydrochlorothiazide (HYDRODIURIL) 50 MG tablet, Take 1 tablet (50 mg total) by mouth daily., Disp: 90 tablet, Rfl: 1 .  hydrOXYzine (ATARAX/VISTARIL) 25 MG tablet, Take 1 tablet (25 mg total) by mouth every 6 (six) hours as needed. For hives, Disp: 16 tablet, Rfl: 1 .  losartan (COZAAR) 100 MG tablet, Take 1 tablet (100 mg total) by mouth daily., Disp: 90 tablet, Rfl: 1 .  metFORMIN (GLUCOPHAGE) 500 MG tablet, TAKE ONE TABLET BY MOUTH THREE TIMES DAILY, Disp: 270 tablet, Rfl: 3 .  pravastatin (PRAVACHOL) 40 MG tablet, TAKE ONE TABLET BY MOUTH ONCE DAILY, Disp: 90 tablet, Rfl: 3  Review of Systems  Constitutional: Negative for chills and fever.  HENT: Positive for congestion, postnasal drip, rhinorrhea, sinus pressure, sinus pain, sneezing and sore  throat. Negative for ear pain, trouble swallowing and voice change.   Eyes: Positive for itching.       Watery eyes  Respiratory: Positive for cough (started this morning), chest tightness and wheezing. Negative for shortness of breath.   Cardiovascular: Negative for chest pain, palpitations and leg swelling.  Neurological: Negative for headaches.    Social History   Tobacco Use  . Smoking status: Never Smoker  . Smokeless tobacco: Never Used  Substance Use Topics  . Alcohol use: Yes    Comment: Occasionally   Objective:   BP 130/82 (BP Location: Left Arm, Patient Position: Sitting, Cuff Size: Large)   Pulse 86   Temp 98.3 F (36.8 C) (Oral)   Resp 16   Wt 268 lb 9.6 oz (121.8 kg)   SpO2 97%   BMI 44.70 kg/m    Physical Exam  Constitutional: She appears well-developed and well-nourished. No distress.  HENT:  Head: Normocephalic and atraumatic.  Right Ear: Hearing, tympanic membrane, external ear and ear canal normal.  Left Ear: Hearing, tympanic membrane, external ear and ear canal normal.  Nose: Nose normal.  Mouth/Throat: Uvula is midline, oropharynx is clear and moist and mucous membranes are normal. No oropharyngeal exudate.  Eyes: Conjunctivae are normal. Pupils are equal, round, and reactive to light. Right eye exhibits no discharge. Left eye exhibits no discharge. No scleral icterus.  Neck: Normal range of motion. Neck supple. No  tracheal deviation present. No thyromegaly present.  Cardiovascular: Normal rate, regular rhythm and normal heart sounds. Exam reveals no gallop and no friction rub.  No murmur heard. Pulmonary/Chest: Effort normal and breath sounds normal. No stridor. No respiratory distress. She has no wheezes. She has no rales.  Lymphadenopathy:    She has no cervical adenopathy.  Skin: Skin is warm and dry. She is not diaphoretic.  Vitals reviewed.     Assessment & Plan:     1. Upper respiratory tract infection, unspecified type Worsening  symptoms that have not responded to OTC medications. Will give augmentin as below. Continue allergy medications. Stay well hydrated and get plenty of rest. Call if no symptom improvement or if symptoms worsen. - amoxicillin-clavulanate (AUGMENTIN) 875-125 MG tablet; Take 1 tablet 2 (two) times daily by mouth.  Dispense: 20 tablet; Refill: 0       Mar Daring, PA-C  [facility]

## 2017-08-26 NOTE — Patient Instructions (Signed)
Add mucinex or Mucinex dm (if needed for cough)  Upper Respiratory Infection, Adult Most upper respiratory infections (URIs) are caused by a virus. A URI affects the nose, throat, and upper air passages. The most common type of URI is often called "the common cold." Follow these instructions at home:  Take medicines only as told by your doctor.  Gargle warm saltwater or take cough drops to comfort your throat as told by your doctor.  Use a warm mist humidifier or inhale steam from a shower to increase air moisture. This may make it easier to breathe.  Drink enough fluid to keep your pee (urine) clear or pale yellow.  Eat soups and other clear broths.  Have a healthy diet.  Rest as needed.  Go back to work when your fever is gone or your doctor says it is okay. ? You may need to stay home longer to avoid giving your URI to others. ? You can also wear a face mask and wash your hands often to prevent spread of the virus.  Use your inhaler more if you have asthma.  Do not use any tobacco products, including cigarettes, chewing tobacco, or electronic cigarettes. If you need help quitting, ask your doctor. Contact a doctor if:  You are getting worse, not better.  Your symptoms are not helped by medicine.  You have chills.  You are getting more short of breath.  You have brown or red mucus.  You have yellow or brown discharge from your nose.  You have pain in your face, especially when you bend forward.  You have a fever.  You have puffy (swollen) neck glands.  You have pain while swallowing.  You have white areas in the back of your throat. Get help right away if:  You have very bad or constant: ? Headache. ? Ear pain. ? Pain in your forehead, behind your eyes, and over your cheekbones (sinus pain). ? Chest pain.  You have long-lasting (chronic) lung disease and any of the following: ? Wheezing. ? Long-lasting cough. ? Coughing up blood. ? A change in your  usual mucus.  You have a stiff neck.  You have changes in your: ? Vision. ? Hearing. ? Thinking. ? Mood. This information is not intended to replace advice given to you by your health care provider. Make sure you discuss any questions you have with your health care provider. Document Released: 03/17/2008 Document Revised: 06/01/2016 Document Reviewed: 01/04/2014 Elsevier Interactive Patient Education  2018 [employer].

## 2017-09-07 DIAGNOSIS — Z0000 Encounter for general adult medical examination without abnormal findings: Secondary | ICD-10-CM | POA: Diagnosis not present

## 2017-09-07 DIAGNOSIS — E785 Hyperlipidemia, unspecified: Secondary | ICD-10-CM | POA: Diagnosis not present

## 2017-09-07 DIAGNOSIS — I10 Essential (primary) hypertension: Secondary | ICD-10-CM | POA: Diagnosis not present

## 2017-09-07 DIAGNOSIS — E119 Type 2 diabetes mellitus without complications: Secondary | ICD-10-CM | POA: Diagnosis not present

## 2017-09-07 DIAGNOSIS — Z1159 Encounter for screening for other viral diseases: Secondary | ICD-10-CM | POA: Diagnosis not present

## 2017-09-07 DIAGNOSIS — Z1329 Encounter for screening for other suspected endocrine disorder: Secondary | ICD-10-CM | POA: Diagnosis not present

## 2017-09-14 ENCOUNTER — Ambulatory Visit: Payer: Managed Care, Other (non HMO) | Admitting: Physician Assistant

## 2017-09-16 ENCOUNTER — Other Ambulatory Visit: Payer: Self-pay | Admitting: Family Medicine

## 2017-09-16 DIAGNOSIS — Z1231 Encounter for screening mammogram for malignant neoplasm of breast: Secondary | ICD-10-CM

## 2017-09-28 ENCOUNTER — Other Ambulatory Visit: Payer: Self-pay | Admitting: Physician Assistant

## 2017-09-28 DIAGNOSIS — I10 Essential (primary) hypertension: Secondary | ICD-10-CM

## 2017-10-04 ENCOUNTER — Other Ambulatory Visit: Payer: Self-pay | Admitting: Physician Assistant

## 2017-10-04 DIAGNOSIS — I10 Essential (primary) hypertension: Secondary | ICD-10-CM

## 2017-10-27 ENCOUNTER — Ambulatory Visit
Admission: RE | Admit: 2017-10-27 | Discharge: 2017-10-27 | Disposition: A | Discharge status: Home / Self Care | Payer: 59 | Source: Ambulatory Visit | Attending: Family Medicine | Admitting: Family Medicine

## 2017-10-27 DIAGNOSIS — Z1231 Encounter for screening mammogram for malignant neoplasm of breast: Secondary | ICD-10-CM | POA: Insufficient documentation

## 2017-10-27 HISTORY — DX: Personal history of antineoplastic chemotherapy: Z92.21

## 2017-10-27 HISTORY — DX: Malignant (primary) neoplasm, unspecified: C80.1

## 2017-12-24 ENCOUNTER — Other Ambulatory Visit: Payer: Self-pay | Admitting: Physician Assistant

## 2017-12-24 DIAGNOSIS — IMO0001 Reserved for inherently not codable concepts without codable children: Secondary | ICD-10-CM

## 2017-12-24 DIAGNOSIS — E1165 Type 2 diabetes mellitus with hyperglycemia: Principal | ICD-10-CM

## 2018-02-13 ENCOUNTER — Other Ambulatory Visit: Payer: Self-pay | Admitting: Physician Assistant

## 2018-02-13 DIAGNOSIS — E785 Hyperlipidemia, unspecified: Secondary | ICD-10-CM

## 2018-03-12 DIAGNOSIS — R7309 Other abnormal glucose: Secondary | ICD-10-CM | POA: Diagnosis not present

## 2018-03-12 DIAGNOSIS — E785 Hyperlipidemia, unspecified: Secondary | ICD-10-CM | POA: Diagnosis not present

## 2018-03-12 DIAGNOSIS — I10 Essential (primary) hypertension: Secondary | ICD-10-CM | POA: Diagnosis not present

## 2018-03-12 DIAGNOSIS — Z0000 Encounter for general adult medical examination without abnormal findings: Secondary | ICD-10-CM | POA: Diagnosis not present

## 2018-03-12 DIAGNOSIS — E119 Type 2 diabetes mellitus without complications: Secondary | ICD-10-CM | POA: Diagnosis not present

## 2018-03-17 ENCOUNTER — Other Ambulatory Visit: Payer: Self-pay | Admitting: Physician Assistant

## 2018-03-17 DIAGNOSIS — I10 Essential (primary) hypertension: Secondary | ICD-10-CM

## 2018-09-16 ENCOUNTER — Other Ambulatory Visit: Payer: Self-pay | Admitting: Physician Assistant

## 2018-09-16 DIAGNOSIS — I10 Essential (primary) hypertension: Secondary | ICD-10-CM

## 2018-09-17 NOTE — Telephone Encounter (Signed)
Patient needs CPE before more refills

## 2018-09-22 ENCOUNTER — Other Ambulatory Visit: Payer: Self-pay | Admitting: Physician Assistant

## 2018-09-22 DIAGNOSIS — I10 Essential (primary) hypertension: Secondary | ICD-10-CM

## 2018-10-11 DIAGNOSIS — E785 Hyperlipidemia, unspecified: Secondary | ICD-10-CM | POA: Diagnosis not present

## 2018-10-11 DIAGNOSIS — Z0000 Encounter for general adult medical examination without abnormal findings: Secondary | ICD-10-CM | POA: Diagnosis not present

## 2018-10-11 DIAGNOSIS — I10 Essential (primary) hypertension: Secondary | ICD-10-CM | POA: Diagnosis not present

## 2018-10-11 DIAGNOSIS — R7309 Other abnormal glucose: Secondary | ICD-10-CM | POA: Diagnosis not present

## 2018-10-11 DIAGNOSIS — E119 Type 2 diabetes mellitus without complications: Secondary | ICD-10-CM | POA: Diagnosis not present

## 2018-10-15 ENCOUNTER — Other Ambulatory Visit: Payer: Self-pay | Admitting: Physician Assistant

## 2018-10-15 DIAGNOSIS — I10 Essential (primary) hypertension: Secondary | ICD-10-CM

## 2018-10-17 NOTE — Telephone Encounter (Signed)
Patient needs CPE 

## 2018-10-20 NOTE — Telephone Encounter (Signed)
No answer and no vm

## 2018-10-25 ENCOUNTER — Other Ambulatory Visit: Payer: Self-pay | Admitting: Family Medicine

## 2018-10-25 DIAGNOSIS — Z1231 Encounter for screening mammogram for malignant neoplasm of breast: Secondary | ICD-10-CM

## 2018-10-27 ENCOUNTER — Encounter: Payer: Self-pay | Admitting: Family Medicine

## 2018-11-08 ENCOUNTER — Encounter: Payer: Self-pay | Admitting: *Deleted

## 2018-11-15 ENCOUNTER — Other Ambulatory Visit: Payer: Self-pay | Admitting: Physician Assistant

## 2018-11-15 DIAGNOSIS — I10 Essential (primary) hypertension: Secondary | ICD-10-CM

## 2018-11-15 NOTE — Telephone Encounter (Signed)
Patient needs CPE. Not seen since 2018

## 2018-11-15 NOTE — Telephone Encounter (Signed)
Patient advised. She states she had to change PCP because we were out of network. She states she has notified the pharmacy, and will go back today again.

## 2018-11-23 ENCOUNTER — Ambulatory Visit
Admission: RE | Admit: 2018-11-23 | Discharge: 2018-11-23 | Disposition: A | Discharge status: Home / Self Care | Payer: 59 | Source: Ambulatory Visit | Attending: Family Medicine | Admitting: Family Medicine

## 2018-11-23 DIAGNOSIS — Z1231 Encounter for screening mammogram for malignant neoplasm of breast: Secondary | ICD-10-CM | POA: Diagnosis not present

## 2018-12-27 ENCOUNTER — Other Ambulatory Visit: Payer: Self-pay | Admitting: Physician Assistant

## 2018-12-27 DIAGNOSIS — IMO0001 Reserved for inherently not codable concepts without codable children: Secondary | ICD-10-CM

## 2018-12-27 DIAGNOSIS — E1165 Type 2 diabetes mellitus with hyperglycemia: Principal | ICD-10-CM

## 2018-12-29 ENCOUNTER — Other Ambulatory Visit: Payer: Self-pay | Admitting: Physician Assistant

## 2018-12-29 DIAGNOSIS — IMO0001 Reserved for inherently not codable concepts without codable children: Secondary | ICD-10-CM

## 2018-12-29 DIAGNOSIS — E1165 Type 2 diabetes mellitus with hyperglycemia: Principal | ICD-10-CM

## 2019-05-03 DIAGNOSIS — U071 COVID-19: Secondary | ICD-10-CM | POA: Diagnosis not present

## 2019-05-17 DIAGNOSIS — U071 COVID-19: Secondary | ICD-10-CM | POA: Diagnosis not present

## 2019-05-31 DIAGNOSIS — U071 COVID-19: Secondary | ICD-10-CM | POA: Diagnosis not present

## 2019-06-21 ENCOUNTER — Other Ambulatory Visit: Payer: Self-pay | Admitting: Physician Assistant

## 2019-06-21 DIAGNOSIS — IMO0001 Reserved for inherently not codable concepts without codable children: Secondary | ICD-10-CM

## 2019-07-06 DIAGNOSIS — U071 COVID-19: Secondary | ICD-10-CM | POA: Diagnosis not present

## 2019-07-15 ENCOUNTER — Telehealth: Payer: Self-pay | Admitting: Family Medicine

## 2019-07-15 NOTE — Telephone Encounter (Signed)
I had said yes...sorry message was not sent through somehow

## 2019-07-15 NOTE — Telephone Encounter (Signed)
Called and scheduled appt

## 2019-07-15 NOTE — Telephone Encounter (Signed)
Pt called saying she is waiting for a call back to see if Jodi Becker will take her back on as a patient.  She has been going to [facility] and she has insurance now.  Please call her at [phone]  [facility]

## 2019-07-18 ENCOUNTER — Ambulatory Visit: Payer: Self-pay | Admitting: Physician Assistant

## 2019-07-20 ENCOUNTER — Ambulatory Visit: Payer: Self-pay | Admitting: Physician Assistant

## 2019-08-18 DIAGNOSIS — Z20828 Contact with and (suspected) exposure to other viral communicable diseases: Secondary | ICD-10-CM | POA: Diagnosis not present

## 2019-08-18 DIAGNOSIS — U071 COVID-19: Secondary | ICD-10-CM | POA: Diagnosis not present

## 2019-08-24 NOTE — Progress Notes (Signed)
Patient: Jodi Becker Female    DOB: Jan 09, 1956   63 y.o.   MRN: [id number] Visit Date: 08/25/2019  Today's Provider: Mar Daring, PA-C   Chief Complaint  Patient presents with  . Diabetes   Subjective:     HPI  Patient here to re-stablish care. She had lost her insurance and was being followed by [facility]. Now she has insurance and would like to re-establish.   Diabetes Mellitus Type II, Follow-up:   Lab Results  Component Value Date   HGBA1C 8.2 03/13/2017   HGBA1C 8.4 11/13/2016   HGBA1C 8.0 08/13/2016   Last seen for diabetes more than 1 years ago.  Management since then includes none. She reports good compliance with treatment. She is not having side effects.  Current symptoms include none and have been stable. Home blood sugar records: blood sugars are not checked  Episodes of hypoglycemia? no   Current Insulin Regimen: none Most Recent Eye Exam: >1year ago Weight trend: fluctuating a bit Prior visit with dietician: no Current diet: in general, an "unhealthy" diet Current exercise: none  ------------------------------------------------------------------------   Hypertension, follow-up:  BP Readings from Last 3 Encounters:  08/25/19 (!) 144/82  08/26/17 130/82  03/13/17 130/80    She was last seen for hypertension 1 years ago.  Management since that visit includes no changes.She reports fair compliance with treatment. She is not having side effects.  She is not exercising. She is not adherent to low salt diet.   Outside blood pressures are not being checked. She is experiencing none.  Patient denies chest pain, chest pressure/discomfort, claudication, dyspnea, exertional chest pressure/discomfort, fatigue, irregular heart beat, lower extremity edema, near-syncope, orthopnea, palpitations, paroxysmal nocturnal dyspnea, syncope and tachypnea.   Cardiovascular risk factors include diabetes mellitus and  hypertension.  Use of agents associated with hypertension: NSAIDS.   ------------------------------------------------------------------------    Allergies  Allergen Reactions  . Sulfa Antibiotics      Current Outpatient Medications:  .  amLODipine (NORVASC) 5 MG tablet, Take 1 tablet (5 mg total) by mouth daily., Disp: 90 tablet, Rfl: 1 .  aspirin 81 MG chewable tablet, Chew by mouth., Disp: , Rfl:  .  Cholecalciferol (VITAMIN D3) 1000 UNITS CAPS, Take by mouth., Disp: , Rfl:  .  glipiZIDE (GLUCOTROL XL) 10 MG 24 hr tablet, Take 1 tablet by mouth once daily with breakfast, Disp: 90 tablet, Rfl: 1 .  hydrochlorothiazide (HYDRODIURIL) 50 MG tablet, TAKE 1 TABLET BY MOUTH ONCE DAILY, Disp: 90 tablet, Rfl: 1 .  linagliptin (TRADJENTA) 5 MG TABS tablet, Take 5 mg by mouth daily., Disp: , Rfl:  .  losartan (COZAAR) 100 MG tablet, TAKE 1 TABLET BY MOUTH ONCE DAILY. PATIENT NEEDS APPOINTMENT BEFORE MORE REFILLS., Disp: 30 tablet, Rfl: 0 .  pravastatin (PRAVACHOL) 40 MG tablet, TAKE 1 TABLET BY MOUTH ONCE DAILY, Disp: 90 tablet, Rfl: 3 .  metFORMIN (GLUCOPHAGE) 500 MG tablet, Take 2 tablets by mouth 2 (two) times daily., Disp: , Rfl:   Review of Systems  Constitutional: Negative.   HENT: Positive for ear pain (right ear).   Eyes: Negative.   Respiratory: Negative.   Cardiovascular: Negative.   Gastrointestinal: Negative.   Endocrine: Negative.   Genitourinary: Negative.   Musculoskeletal: Negative.   Skin: Negative.   Allergic/Immunologic: Negative.   Neurological: Negative.   Hematological: Negative.   Psychiatric/Behavioral: Negative.     Social History   Tobacco Use  . Smoking status: Never Smoker  .  Smokeless tobacco: Never Used  Substance Use Topics  . Alcohol use: Yes    Comment: Occasionally      Objective:   BP (!) 144/82 (BP Location: Right Arm, Cuff Size: Large)   Pulse 72   Temp (!) 96.6 F (35.9 C) (Temporal)   Resp 18   Wt 267 lb (121.1 kg)   SpO2 97%  Comment: room air  BMI 44.43 kg/m  Vitals:   08/25/19 1122 08/25/19 1123  BP: 140/82 (!) 144/82  Pulse: 72   Resp: 18   Temp: (!) 96.6 F (35.9 C)   TempSrc: Temporal   SpO2: 97%   Weight: 267 lb (121.1 kg)   Body mass index is 44.43 kg/m.   Physical Exam Vitals signs reviewed.  Constitutional:      General: She is not in acute distress.    Appearance: Normal appearance. She is well-developed. She is obese. She is not ill-appearing or diaphoretic.  HENT:     Head: Normocephalic and atraumatic.     Right Ear: Hearing, ear canal and external ear normal. A middle ear effusion is present. Tympanic membrane is not scarred, perforated, erythematous, retracted or bulging.     Left Ear: Hearing, ear canal and external ear normal. A middle ear effusion is present. Tympanic membrane is not scarred, perforated, erythematous, retracted or bulging.     Nose: Nose normal.     Mouth/Throat:     Mouth: Mucous membranes are moist.     Pharynx: Oropharynx is clear. No oropharyngeal exudate.  Eyes:     General: No scleral icterus.       Right eye: No discharge.        Left eye: No discharge.     Conjunctiva/sclera: Conjunctivae normal.     Pupils: Pupils are equal, round, and reactive to light.  Neck:     Musculoskeletal: Normal range of motion and neck supple.     Thyroid: No thyromegaly.     Vascular: No JVD.     Trachea: No tracheal deviation.  Cardiovascular:     Rate and Rhythm: Normal rate and regular rhythm.     Pulses: Normal pulses.     Heart sounds: Normal heart sounds. No murmur. No friction rub. No gallop.   Pulmonary:     Effort: Pulmonary effort is normal. No respiratory distress.     Breath sounds: Normal breath sounds. No wheezing or rales.  Chest:     Chest wall: No tenderness.  Abdominal:     General: Bowel sounds are normal. There is no distension.     Palpations: Abdomen is soft. There is no mass.     Tenderness: There is no abdominal tenderness. There is no  guarding or rebound.  Musculoskeletal: Normal range of motion.        General: No tenderness.     Right lower leg: No edema.     Left lower leg: No edema.  Lymphadenopathy:     Cervical: No cervical adenopathy.  Skin:    General: Skin is warm and dry.     Capillary Refill: Capillary refill takes less than 2 seconds.     Findings: No rash.  Neurological:     General: No focal deficit present.     Mental Status: She is alert and oriented to person, place, and time. Mental status is at baseline.  Psychiatric:        Mood and Affect: Mood normal.        Behavior: Behavior normal.  Thought Content: Thought content normal.        Judgment: Judgment normal.    Diabetic Foot Exam - Simple   Simple Foot Form Diabetic Foot exam was performed with the following findings: Yes 08/25/2019 12:23 PM  Visual Inspection No deformities, no ulcerations, no other skin breakdown bilaterally: Yes Sensation Testing Intact to touch and monofilament testing bilaterally: Yes Pulse Check Posterior Tibialis and Dorsalis pulse intact bilaterally: Yes Comments      No results found for any visits on 08/25/19.     Assessment & Plan    1. Benign hypertension Stable. Diagnosis pulled for medication refill. Continue current medical treatment plan. Will check labs as below and f/u pending results. - CBC w/Diff/Platelet - Comprehensive Metabolic Panel (CMET) - Lipid Profile - HgB A1c - TSH - amLODipine (NORVASC) 5 MG tablet; Take 1 tablet (5 mg total) by mouth daily.  Dispense: 90 tablet; Refill: 1 - hydrochlorothiazide (HYDRODIURIL) 50 MG tablet; Take 1 tablet (50 mg total) by mouth daily.  Dispense: 90 tablet; Refill: 1 - losartan (COZAAR) 100 MG tablet; TAKE 1 TABLET BY MOUTH ONCE DAILY.  Dispense: 90 tablet; Refill: 1  2. History of colon cancer Patient cannot remember how long ago her diagnosis was but feels it was approx 8-9 years ago. She cannot remember when her last colonoscopy was and  feels she is due for her f/u. Referral placed to Dr. Alice Reichert (needs an outpatient facility for colonoscopy due to cost).  - CBC w/Diff/Platelet - Comprehensive Metabolic Panel (CMET) - Lipid Profile - HgB A1c - TSH - Ambulatory referral to Gastroenterology  3. Controlled type 2 diabetes mellitus without complication, without long-term current use of insulin (HCC) Stable. Diagnosis pulled for medication refill. Continue current medical treatment plan. Will check labs as below and f/u pending results. I will see her back in 3-6 months pending results.  - CBC w/Diff/Platelet - Comprehensive Metabolic Panel (CMET) - Lipid Profile - HgB A1c - TSH - linagliptin (TRADJENTA) 5 MG TABS tablet; Take 1 tablet (5 mg total) by mouth daily.  Dispense: 90 tablet; Refill: 1 - metFORMIN (GLUCOPHAGE) 500 MG tablet; Take 2 tablets (1,000 mg total) by mouth 2 (two) times daily.  Dispense: 360 tablet; Refill: 1  4. Pure hypercholesterolemia Continue Pravastatin 40mg . Will check labs as below and f/u pending results. - CBC w/Diff/Platelet - Comprehensive Metabolic Panel (CMET) - Lipid Profile - HgB A1c - TSH - pravastatin (PRAVACHOL) 40 MG tablet; Take 1 tablet (40 mg total) by mouth daily.  Dispense: 90 tablet; Refill: 1  5. Class 3 severe obesity due to excess calories with serious comorbidity and body mass index (BMI) of 40.0 to 44.9 in adult [facility]) Counseled patient on healthy lifestyle modifications including dieting and exercise.   6. Encounter for hepatitis C screening test for low risk patient Will check labs as below and f/u pending results. - Hepatitis C Antibody  7. Screening for HIV without presence of risk factors Will check labs as below and f/u pending results. - HIV antibody (with reflex)  8. Dysfunction of both eustachian tubes Has serous fluid present behind both ear drums. Advised to try flonase as below.  - fluticasone (FLONASE) 50 MCG/ACT nasal spray; Place 2 sprays into both  nostrils daily.  Dispense: 16 g; Refill: [place], PA-C  [facility]

## 2019-08-25 ENCOUNTER — Other Ambulatory Visit: Payer: Self-pay

## 2019-08-25 ENCOUNTER — Ambulatory Visit: Payer: 59 | Admitting: Physician Assistant

## 2019-08-25 ENCOUNTER — Encounter: Payer: Self-pay | Admitting: Physician Assistant

## 2019-08-25 VITALS — BP 144/82 | HR 72 | Temp 96.6°F | Resp 18 | Wt 267.0 lb

## 2019-08-25 DIAGNOSIS — H6983 Other specified disorders of Eustachian tube, bilateral: Secondary | ICD-10-CM | POA: Diagnosis not present

## 2019-08-25 DIAGNOSIS — E66813 Obesity, class 3: Secondary | ICD-10-CM

## 2019-08-25 DIAGNOSIS — E7800 Pure hypercholesterolemia, unspecified: Secondary | ICD-10-CM

## 2019-08-25 DIAGNOSIS — H6993 Unspecified Eustachian tube disorder, bilateral: Secondary | ICD-10-CM

## 2019-08-25 DIAGNOSIS — R69 Illness, unspecified: Secondary | ICD-10-CM | POA: Diagnosis not present

## 2019-08-25 DIAGNOSIS — Z85038 Personal history of other malignant neoplasm of large intestine: Secondary | ICD-10-CM | POA: Diagnosis not present

## 2019-08-25 DIAGNOSIS — E119 Type 2 diabetes mellitus without complications: Secondary | ICD-10-CM | POA: Diagnosis not present

## 2019-08-25 DIAGNOSIS — Z114 Encounter for screening for human immunodeficiency virus [HIV]: Secondary | ICD-10-CM | POA: Diagnosis not present

## 2019-08-25 DIAGNOSIS — Z6841 Body Mass Index (BMI) 40.0 and over, adult: Secondary | ICD-10-CM | POA: Diagnosis not present

## 2019-08-25 DIAGNOSIS — I10 Essential (primary) hypertension: Secondary | ICD-10-CM | POA: Diagnosis not present

## 2019-08-25 DIAGNOSIS — E785 Hyperlipidemia, unspecified: Secondary | ICD-10-CM | POA: Diagnosis not present

## 2019-08-25 DIAGNOSIS — Z1159 Encounter for screening for other viral diseases: Secondary | ICD-10-CM | POA: Diagnosis not present

## 2019-08-25 MED ORDER — METFORMIN HCL 500 MG PO TABS: 1000.0000 mg | ORAL_TABLET | Freq: Two times a day (BID) | ORAL | 1 refills | Status: DC

## 2019-08-25 MED ORDER — HYDROCHLOROTHIAZIDE 50 MG PO TABS: 50.0000 mg | ORAL_TABLET | Freq: Every day | ORAL | 1 refills | Status: DC

## 2019-08-25 MED ORDER — PRAVASTATIN SODIUM 40 MG PO TABS: 40.0000 mg | ORAL_TABLET | Freq: Every day | ORAL | 1 refills | Status: DC

## 2019-08-25 MED ORDER — LINAGLIPTIN 5 MG PO TABS: 5.0000 mg | ORAL_TABLET | Freq: Every day | ORAL | 1 refills | Status: DC

## 2019-08-25 MED ORDER — FLUTICASONE PROPIONATE 50 MCG/ACT NA SUSP: 2.0000 | Freq: Every day | NASAL | 6 refills | Status: DC

## 2019-08-25 MED ORDER — AMLODIPINE BESYLATE 5 MG PO TABS: 5.0000 mg | ORAL_TABLET | Freq: Every day | ORAL | 1 refills | Status: DC

## 2019-08-25 MED ORDER — LOSARTAN POTASSIUM 100 MG PO TABS: ORAL_TABLET | ORAL | 1 refills | Status: DC

## 2019-08-25 NOTE — Patient Instructions (Addendum)
Cepacol mouthwash   Health Maintenance for Postmenopausal Women Menopause is a normal process in which your ability to get pregnant comes to an end. This process happens slowly over many months or years, usually between the ages of 52 and 19. Menopause is complete when you have missed your menstrual periods for 12 months. It is important to talk with your health care provider about some of the most common conditions that affect women after menopause (postmenopausal women). These include heart disease, cancer, and bone loss (osteoporosis). Adopting a healthy lifestyle and getting preventive care can help to promote your health and wellness. The actions you take can also lower your chances of developing some of these common conditions. What should I know about menopause? During menopause, you may get a number of symptoms, such as:  Hot flashes. These can be moderate or severe.  Night sweats.  Decrease in sex drive.  Mood swings.  Headaches.  Tiredness.  Irritability.  Memory problems.  Insomnia. Choosing to treat or not to treat these symptoms is a decision that you make with your health care provider. Do I need hormone replacement therapy?  Hormone replacement therapy is effective in treating symptoms that are caused by menopause, such as hot flashes and night sweats.  Hormone replacement carries certain risks, especially as you become older. If you are thinking about using estrogen or estrogen with progestin, discuss the benefits and risks with your health care provider. What is my risk for heart disease and stroke? The risk of heart disease, heart attack, and stroke increases as you age. One of the causes may be a change in the body's hormones during menopause. This can affect how your body uses dietary fats, triglycerides, and cholesterol. Heart attack and stroke are medical emergencies. There are many things that you can do to help prevent heart disease and stroke. Watch your  blood pressure  High blood pressure causes heart disease and increases the risk of stroke. This is more likely to develop in people who have high blood pressure readings, are of African descent, or are overweight.  Have your blood pressure checked: ? Every 3-5 years if you are 82-62 years of age. ? Every year if you are 58 years old or older. Eat a healthy diet   Eat a diet that includes plenty of vegetables, fruits, low-fat dairy products, and lean protein.  Do not eat a lot of foods that are high in solid fats, added sugars, or sodium. Get regular exercise Get regular exercise. This is one of the most important things you can do for your health. Most adults should:  Try to exercise for at least 150 minutes each week. The exercise should increase your heart rate and make you sweat (moderate-intensity exercise).  Try to do strengthening exercises at least twice each week. Do these in addition to the moderate-intensity exercise.  Spend less time sitting. Even light physical activity can be beneficial. Other tips  Work with your health care provider to achieve or maintain a healthy weight.  Do not use any products that contain nicotine or tobacco, such as cigarettes, e-cigarettes, and chewing tobacco. If you need help quitting, ask your health care provider.  Know your numbers. Ask your health care provider to check your cholesterol and your blood sugar (glucose). Continue to have your blood tested as directed by your health care provider. Do I need screening for cancer? Depending on your health history and family history, you may need to have cancer screening at different stages  of your life. This may include screening for:  Breast cancer.  Cervical cancer.  Lung cancer.  Colorectal cancer. What is my risk for osteoporosis? After menopause, you may be at increased risk for osteoporosis. Osteoporosis is a condition in which bone destruction happens more quickly than new bone  creation. To help prevent osteoporosis or the bone fractures that can happen because of osteoporosis, you may take the following actions:  If you are 23-77 years old, get at least 1,000 mg of calcium and at least 600 mg of vitamin D per day.  If you are older than age 73 but younger than age 60, get at least 1,200 mg of calcium and at least 600 mg of vitamin D per day.  If you are older than age 47, get at least 1,200 mg of calcium and at least 800 mg of vitamin D per day. Smoking and drinking excessive alcohol increase the risk of osteoporosis. Eat foods that are rich in calcium and vitamin D, and do weight-bearing exercises several times each week as directed by your health care provider. How does menopause affect my mental health? Depression may occur at any age, but it is more common as you become older. Common symptoms of depression include:  Low or sad mood.  Changes in sleep patterns.  Changes in appetite or eating patterns.  Feeling an overall lack of motivation or enjoyment of activities that you previously enjoyed.  Frequent crying spells. Talk with your health care provider if you think that you are experiencing depression. General instructions See your health care provider for regular wellness exams and vaccines. This may include:  Scheduling regular health, dental, and eye exams.  Getting and maintaining your vaccines. These include: ? Influenza vaccine. Get this vaccine each year before the flu season begins. ? Pneumonia vaccine. ? Shingles vaccine. ? Tetanus, diphtheria, and pertussis (Tdap) booster vaccine. Your health care provider may also recommend other immunizations. Tell your health care provider if you have ever been abused or do not feel safe at home. Summary  Menopause is a normal process in which your ability to get pregnant comes to an end.  This condition causes hot flashes, night sweats, decreased interest in sex, mood swings, headaches, or lack of  sleep.  Treatment for this condition may include hormone replacement therapy.  Take actions to keep yourself healthy, including exercising regularly, eating a healthy diet, watching your weight, and checking your blood pressure and blood sugar levels.  Get screened for cancer and depression. Make sure that you are up to date with all your vaccines. This information is not intended to replace advice given to you by your health care provider. Make sure you discuss any questions you have with your health care provider. Document Released: 11/21/2005 Document Revised: 09/22/2018 Document Reviewed: 09/22/2018 Elsevier Patient Education  2020 [employer].

## 2019-08-26 ENCOUNTER — Telehealth: Payer: Self-pay

## 2019-08-26 DIAGNOSIS — E1165 Type 2 diabetes mellitus with hyperglycemia: Secondary | ICD-10-CM

## 2019-08-26 LAB — LIPID PANEL
Chol/HDL Ratio: 2.4 ratio (ref 0.0–4.4)
Cholesterol, Total: 134 mg/dL (ref 100–199)
HDL: 56 mg/dL (ref >39)
LDL Chol Calc (NIH): 57 mg/dL (ref 0–99)
Triglycerides: 117 mg/dL (ref 0–149)
VLDL Cholesterol Cal: 21 mg/dL (ref 5–40)

## 2019-08-26 LAB — CBC WITH DIFFERENTIAL/PLATELET
Basophils Absolute: 0 10*3/uL (ref 0.0–0.2)
Basos: 0 %
EOS (ABSOLUTE): 0.2 10*3/uL (ref 0.0–0.4)
Eos: 4 %
Hematocrit: 42.1 % (ref 34.0–46.6)
Hemoglobin: 13.4 g/dL (ref 11.1–15.9)
Immature Grans (Abs): 0 10*3/uL (ref 0.0–0.1)
Immature Granulocytes: 0 %
Lymphocytes Absolute: 1.6 10*3/uL (ref 0.7–3.1)
Lymphs: 34 %
MCH: 27.1 pg (ref 26.6–33.0)
MCHC: 31.8 g/dL (ref 31.5–35.7)
MCV: 85 fL (ref 79–97)
Monocytes Absolute: 0.4 10*3/uL (ref 0.1–0.9)
Monocytes: 8 %
Neutrophils Absolute: 2.5 10*3/uL (ref 1.4–7.0)
Neutrophils: 54 %
Platelets: 205 10*3/uL (ref 150–450)
RBC: 4.95 x10E6/uL (ref 3.77–5.28)
RDW: 13.5 % (ref 11.7–15.4)
WBC: 4.7 10*3/uL (ref 3.4–10.8)

## 2019-08-26 LAB — TSH: TSH: 1.39 u[IU]/mL (ref 0.450–4.500)

## 2019-08-26 LAB — COMPREHENSIVE METABOLIC PANEL
ALT: 57 IU/L — ABNORMAL HIGH (ref 0–32)
AST: 30 IU/L (ref 0–40)
Albumin/Globulin Ratio: 1.7 (ref 1.2–2.2)
Albumin: 4.5 g/dL (ref 3.8–4.8)
Alkaline Phosphatase: 104 IU/L (ref 39–117)
BUN/Creatinine Ratio: 21 (ref 12–28)
BUN: 15 mg/dL (ref 8–27)
Bilirubin Total: 0.5 mg/dL (ref 0.0–1.2)
CO2: 23 mmol/L (ref 20–29)
Calcium: 11.2 mg/dL — ABNORMAL HIGH (ref 8.7–10.3)
Chloride: 100 mmol/L (ref 96–106)
Creatinine, Ser: 0.73 mg/dL (ref 0.57–1.00)
GFR calc Af Amer: 101 mL/min/{1.73_m2} (ref >59)
GFR calc non Af Amer: 88 mL/min/{1.73_m2} (ref >59)
Globulin, Total: 2.7 g/dL (ref 1.5–4.5)
Glucose: 232 mg/dL — ABNORMAL HIGH (ref 65–99)
Potassium: 4.3 mmol/L (ref 3.5–5.2)
Sodium: 137 mmol/L (ref 134–144)
Total Protein: 7.2 g/dL (ref 6.0–8.5)

## 2019-08-26 LAB — HEMOGLOBIN A1C
Est. average glucose Bld gHb Est-mCnc: 235 mg/dL
Hgb A1c MFr Bld: 9.8 % — ABNORMAL HIGH (ref 4.8–5.6)

## 2019-08-26 LAB — HIV ANTIBODY (ROUTINE TESTING W REFLEX): HIV Screen 4th Generation wRfx: NONREACTIVE

## 2019-08-26 LAB — HEPATITIS C ANTIBODY: Hep C Virus Ab: <0.1 s/co ratio (ref 0.0–0.9)

## 2019-08-26 NOTE — Telephone Encounter (Signed)
-----   Message from Mar Daring, [place] sent at 08/26/2019  8:18 AM EST ----- Blood count is normal. Kidney function is normal. One liver enzyme is borderline high. Limit fatty foods, red meats, sugars, tylenol (acetaminophen) based products over the counter and any alcohol. Sodium and potassium are normal. Calcium is borderline high. Cholesterol is great. A1c is up to 9.8. Thyroid is normal. Hep C and HIV screening are both negative. You are already on max doses of all your diabetic medications. So to help lower your A1c I would have to add another medication. If agreeable we can add pioglitazone. This is the only remaining generic medicine that could help lower your A1c. There are other medications but the are all still brand name and could get expensive.

## 2019-08-26 NOTE — Telephone Encounter (Signed)
LMTCB

## 2019-08-30 MED ORDER — PIOGLITAZONE HCL 45 MG PO TABS: 45.0000 mg | ORAL_TABLET | Freq: Every day | ORAL | 1 refills | Status: DC

## 2019-08-30 NOTE — Telephone Encounter (Signed)
Patient advised as directed below. She agreed to the Pioglitazone. Patient going to come and pick up copy of her labs. Printed and placed up front for pick up.

## 2019-08-30 NOTE — Telephone Encounter (Signed)
Sent in to [facility]

## 2019-08-30 NOTE — Telephone Encounter (Signed)
Left detailed message letting patient know about Rx. KW

## 2019-09-01 DIAGNOSIS — U071 COVID-19: Secondary | ICD-10-CM | POA: Diagnosis not present

## 2019-09-01 DIAGNOSIS — Z20828 Contact with and (suspected) exposure to other viral communicable diseases: Secondary | ICD-10-CM | POA: Diagnosis not present

## 2019-09-13 NOTE — Telephone Encounter (Signed)
Pt stated she is cautious to take pioglitazone (ACTOS) 45 MG tablet because of the side effects her pharmacy told her, one being cancer causing. She would like to know if an alternative medication is available.

## 2019-09-13 NOTE — Telephone Encounter (Signed)
From [facility] 

## 2019-09-14 ENCOUNTER — Telehealth: Payer: Self-pay

## 2019-09-14 MED ORDER — EMPAGLIFLOZIN 25 MG PO TABS: 25.0000 mg | ORAL_TABLET | Freq: Every day | ORAL | 0 refills | Status: DC

## 2019-09-14 NOTE — Telephone Encounter (Signed)
LMTCB

## 2019-09-14 NOTE — Telephone Encounter (Signed)
Left patient a message advising her as stated below.

## 2019-09-14 NOTE — Telephone Encounter (Signed)
Copied from [place] [id number]. Topic: General - Inquiry >> Sep 14, 2019 10:27 AM [name] wrote: Reason for CRM: Patient is requesting a different medication empagliflozin (JARDIANCE) 25 MG TABS is too expensive after insurance $168.  Patient would like another medication please. Call back [phone]

## 2019-09-14 NOTE — Addendum Note (Signed)
Addended by: Mar Daring on: 09/14/2019 08:36 AM   Modules accepted: Orders

## 2019-09-14 NOTE — Telephone Encounter (Signed)
I have sent in the cheapest which is pioglitazone.  All others will most likely be just as expensive. This was why pioglitazone had been sent initially.

## 2019-09-14 NOTE — Telephone Encounter (Signed)
There are other medications but they are still brand name and can be expensive. I can send in to see if covered and affordable for her.  Jardiance sent in

## 2019-09-15 DIAGNOSIS — Z20828 Contact with and (suspected) exposure to other viral communicable diseases: Secondary | ICD-10-CM | POA: Diagnosis not present

## 2019-09-15 DIAGNOSIS — U071 COVID-19: Secondary | ICD-10-CM | POA: Diagnosis not present

## 2019-09-15 NOTE — Telephone Encounter (Signed)
I hate that she is not willing to give it a try. I weigh the risks and benefits of every medication and I feel that this medication is the best thing to help lower her sugar, prevent any long term damage from elevated sugars like kidney disease or peripheral artery disease that could lead to dialysis, amputations, heart attacks or strokes. It is definitely her decision and I wish the other medications were affordable. I cannot change that fact. I am offended the pharmacist would not think I would consider risk of any medication before prescribing it. Unfortunately the only other medical option to lower her sugar would next be insulin, it is the next most affordable option.

## 2019-09-15 NOTE — Telephone Encounter (Signed)
FYI

## 2019-09-15 NOTE — Telephone Encounter (Signed)
Pt states the pharmacist advised her the pioglitazone advised her this med would cause fluid retention and swelling and asked her why the dr would put her on this medication?  So she is scared to take it.  Pt states pharmacy also told her she is on another med that causing swelling. They also advised her this medication could cause bladder cancer.  Pt states she already had cancer 3 times.  So pt states if there is not anything else, she will "+just leave it alone".  Pt would also like you to mail her a copy of her last labs. Pt declined for mychart sign up.

## 2019-09-16 ENCOUNTER — Other Ambulatory Visit: Payer: Self-pay | Admitting: Physician Assistant

## 2019-09-16 DIAGNOSIS — I10 Essential (primary) hypertension: Secondary | ICD-10-CM

## 2019-09-16 MED ORDER — LOSARTAN POTASSIUM 100 MG PO TABS: ORAL_TABLET | ORAL | 1 refills | Status: DC

## 2019-09-16 NOTE — Telephone Encounter (Signed)
[facility] faxed refill request for the following medications:  losartan (COZAAR) 100 MG tablet  90 day supply Last Rx: 08/25/2019 Order Class was No Print LOV: 08/25/2019 Please advise. Thanks TNP

## 2019-09-16 NOTE — Telephone Encounter (Signed)
Patient reports that she is going to try the Pioglitazone.

## 2019-09-22 DIAGNOSIS — U071 COVID-19: Secondary | ICD-10-CM | POA: Diagnosis not present

## 2019-09-22 DIAGNOSIS — Z20828 Contact with and (suspected) exposure to other viral communicable diseases: Secondary | ICD-10-CM | POA: Diagnosis not present

## 2019-09-22 LAB — HM DIABETES EYE EXAM

## 2019-10-04 ENCOUNTER — Telehealth: Payer: Self-pay | Admitting: Physician Assistant

## 2019-10-04 DIAGNOSIS — R05 Cough: Secondary | ICD-10-CM

## 2019-10-04 DIAGNOSIS — U071 COVID-19: Secondary | ICD-10-CM

## 2019-10-04 DIAGNOSIS — R059 Cough, unspecified: Secondary | ICD-10-CM

## 2019-10-04 MED ORDER — BENZONATATE 200 MG PO CAPS: 200.0000 mg | ORAL_CAPSULE | Freq: Three times a day (TID) | ORAL | 0 refills | Status: DC | PRN

## 2019-10-04 NOTE — Telephone Encounter (Signed)
Tessalon perles sent in and can use chloraseptic spray OTC and/or salt water gargles and tylenol for throat pain.

## 2019-10-04 NOTE — Telephone Encounter (Signed)
From [facility] 

## 2019-10-04 NOTE — Telephone Encounter (Signed)
Patient called and said that she tested positive for covid and her cough is really bad and her throat hurts a lot and would like to know if Sonia Baller can call something in to her pharmacy for pain. If someone can please give her a call back, thanks.

## 2019-10-05 NOTE — Telephone Encounter (Signed)
Patient advised as below.  

## 2019-10-10 NOTE — Progress Notes (Signed)
Patient: Jodi Becker Female    DOB: 06-19-1956   63 y.o.   MRN: [id number] Visit Date: 10/11/2019  Today's Provider: Mar Daring, PA-C   Chief Complaint  Patient presents with  . Fatigue   Subjective:    Virtual Visit via Telephone Note  I connected with [name] on 10/11/19 at  2:40 PM EST by telephone and verified that I am speaking with the correct person using two identifiers.  Location: Patient: Home Provider: [facility]   I discussed the limitations, risks, security and privacy concerns of performing an evaluation and management service by telephone and the availability of in person appointments. I also discussed with the patient that there may be a patient responsible charge related to this service. The patient expressed understanding and agreed to proceed.   HPI  Patient with c/o weakness, fatigue and nauseous. Patient was diagnosed with COVID-19, Sep 18 2019. She reports that she was told by the health department she needed to follow up with her provider for continued fatigue and weakness. She does not feel she can return to work at this time due to the intense fatigue that still remains from her covid-19.   Allergies  Allergen Reactions  . Sulfa Antibiotics      Current Outpatient Medications:  .  amLODipine (NORVASC) 5 MG tablet, Take 1 tablet (5 mg total) by mouth daily., Disp: 90 tablet, Rfl: 1 .  aspirin 81 MG chewable tablet, Chew by mouth., Disp: , Rfl:  .  Cholecalciferol (VITAMIN D3) 1000 UNITS CAPS, Take by mouth., Disp: , Rfl:  .  fluticasone (FLONASE) 50 MCG/ACT nasal spray, Place 2 sprays into both nostrils daily., Disp: 16 g, Rfl: 6 .  glipiZIDE (GLUCOTROL XL) 10 MG 24 hr tablet, Take 1 tablet by mouth once daily with breakfast, Disp: 90 tablet, Rfl: 1 .  hydrochlorothiazide (HYDRODIURIL) 50 MG tablet, Take 1 tablet (50 mg total) by mouth daily., Disp: 90 tablet, Rfl: 1 .  linagliptin (TRADJENTA) 5 MG TABS tablet, Take 1 tablet  (5 mg total) by mouth daily., Disp: 90 tablet, Rfl: 1 .  losartan (COZAAR) 100 MG tablet, TAKE 1 TABLET BY MOUTH ONCE DAILY., Disp: 90 tablet, Rfl: 1 .  metFORMIN (GLUCOPHAGE) 500 MG tablet, Take 2 tablets (1,000 mg total) by mouth 2 (two) times daily., Disp: 360 tablet, Rfl: 1 .  pravastatin (PRAVACHOL) 40 MG tablet, Take 1 tablet (40 mg total) by mouth daily., Disp: 90 tablet, Rfl: 1  Review of Systems  Constitutional: Positive for fatigue. Negative for chills, diaphoresis and fever.  Cardiovascular: Negative.   Gastrointestinal: Positive for nausea. Negative for abdominal pain, constipation, diarrhea and vomiting.  Neurological: Positive for weakness. Negative for dizziness, numbness and headaches.    Social History   Tobacco Use  . Smoking status: Never Smoker  . Smokeless tobacco: Never Used  Substance Use Topics  . Alcohol use: Yes    Comment: Occasionally      Objective:   BP 108/64 (BP Location: Left Arm, Patient Position: Sitting, Cuff Size: Normal)   Pulse 83   Temp 97.8 F (36.6 C) (Oral)   Ht 5\' 5"  (1.651 m)   Wt 267 lb (121.1 kg)   BMI 44.43 kg/m  Vitals:   10/11/19 1128  BP: 108/64  Pulse: 83  Temp: 97.8 F (36.6 C)  TempSrc: Oral  Weight: 267 lb (121.1 kg)  Height: 5\' 5"  (1.651 m)  Body mass index is 44.43 kg/m.  Physical Exam Vitals reviewed.  Constitutional:      General: She is not in acute distress. Pulmonary:     Effort: No respiratory distress.  Neurological:     Mental Status: She is alert.      No results found for any visits on 10/11/19.     Assessment & Plan     1. History of 2019 novel coronavirus disease (COVID-19) Was diagnosed on 09/18/19. Still having fatigue and weakness. Advised to push fluids and try to increase diet slowly as tolerated. Note will be provided to keep her out of work until 10/24/19.  2. Postviral fatigue syndrome See above medical treatment plan.  I discussed the assessment and treatment plan with the  patient. The patient was provided an opportunity to ask questions and all were answered. The patient agreed with the plan and demonstrated an understanding of the instructions.   The patient was advised to call back or seek an in-person evaluation if the symptoms worsen or if the condition fails to improve as anticipated.  I provided 9 minutes of non-face-to-face time during this encounter.      Mar Daring, PA-C  [facility]

## 2019-10-11 ENCOUNTER — Ambulatory Visit (INDEPENDENT_AMBULATORY_CARE_PROVIDER_SITE_OTHER): Payer: 59 | Admitting: Physician Assistant

## 2019-10-11 ENCOUNTER — Encounter: Payer: Self-pay | Admitting: Physician Assistant

## 2019-10-11 VITALS — BP 108/64 | HR 83 | Temp 97.8°F | Ht 65.0 in | Wt 267.0 lb

## 2019-10-11 DIAGNOSIS — Z8619 Personal history of other infectious and parasitic diseases: Secondary | ICD-10-CM | POA: Diagnosis not present

## 2019-10-11 DIAGNOSIS — G933 Postviral fatigue syndrome: Secondary | ICD-10-CM | POA: Diagnosis not present

## 2019-10-11 DIAGNOSIS — G9331 Postviral fatigue syndrome: Secondary | ICD-10-CM

## 2019-10-11 DIAGNOSIS — Z8616 Personal history of COVID-19: Secondary | ICD-10-CM

## 2019-10-12 ENCOUNTER — Encounter: Payer: Self-pay | Admitting: Physician Assistant

## 2019-11-01 DIAGNOSIS — Z23 Encounter for immunization: Secondary | ICD-10-CM | POA: Diagnosis not present

## 2019-12-07 ENCOUNTER — Other Ambulatory Visit: Payer: Self-pay | Admitting: Physician Assistant

## 2019-12-07 DIAGNOSIS — I10 Essential (primary) hypertension: Secondary | ICD-10-CM

## 2019-12-07 MED ORDER — AMLODIPINE BESYLATE 5 MG PO TABS: 5.0000 mg | ORAL_TABLET | Freq: Every day | ORAL | 1 refills | Status: DC

## 2019-12-07 NOTE — Telephone Encounter (Signed)
[facility] faxed refill request for the following medications:  amLODipine (NORVASC) 5 MG tablet  Please advise. Thanks, TGH  

## 2019-12-26 DIAGNOSIS — U071 COVID-19: Secondary | ICD-10-CM | POA: Diagnosis not present

## 2019-12-26 DIAGNOSIS — Z20828 Contact with and (suspected) exposure to other viral communicable diseases: Secondary | ICD-10-CM | POA: Diagnosis not present

## 2020-01-23 ENCOUNTER — Other Ambulatory Visit: Payer: Self-pay | Admitting: Physician Assistant

## 2020-01-23 DIAGNOSIS — Z20828 Contact with and (suspected) exposure to other viral communicable diseases: Secondary | ICD-10-CM | POA: Diagnosis not present

## 2020-01-23 DIAGNOSIS — I10 Essential (primary) hypertension: Secondary | ICD-10-CM

## 2020-01-23 DIAGNOSIS — U071 COVID-19: Secondary | ICD-10-CM | POA: Diagnosis not present

## 2020-01-23 MED ORDER — HYDROCHLOROTHIAZIDE 50 MG PO TABS: 50.0000 mg | ORAL_TABLET | Freq: Every day | ORAL | 0 refills | Status: DC

## 2020-01-23 NOTE — Telephone Encounter (Signed)
[facility] faxed refill request for the following medications:  hydrochlorothiazide (HYDRODIURIL) 50 MG tablet  90 day supply Last Rx: 08/25/2019 LOV: 10/11/2019 Please advise. Thanks TNP

## 2020-01-30 DIAGNOSIS — Z20828 Contact with and (suspected) exposure to other viral communicable diseases: Secondary | ICD-10-CM | POA: Diagnosis not present

## 2020-01-30 DIAGNOSIS — U071 COVID-19: Secondary | ICD-10-CM | POA: Diagnosis not present

## 2020-01-31 DIAGNOSIS — U071 COVID-19: Secondary | ICD-10-CM | POA: Diagnosis not present

## 2020-01-31 DIAGNOSIS — Z20828 Contact with and (suspected) exposure to other viral communicable diseases: Secondary | ICD-10-CM | POA: Diagnosis not present

## 2020-02-06 DIAGNOSIS — Z20828 Contact with and (suspected) exposure to other viral communicable diseases: Secondary | ICD-10-CM | POA: Diagnosis not present

## 2020-02-06 DIAGNOSIS — U071 COVID-19: Secondary | ICD-10-CM | POA: Diagnosis not present

## 2020-02-20 DIAGNOSIS — U071 COVID-19: Secondary | ICD-10-CM | POA: Diagnosis not present

## 2020-02-20 DIAGNOSIS — Z20828 Contact with and (suspected) exposure to other viral communicable diseases: Secondary | ICD-10-CM | POA: Diagnosis not present

## 2020-02-27 DIAGNOSIS — U071 COVID-19: Secondary | ICD-10-CM | POA: Diagnosis not present

## 2020-02-27 DIAGNOSIS — Z20828 Contact with and (suspected) exposure to other viral communicable diseases: Secondary | ICD-10-CM | POA: Diagnosis not present

## 2020-02-28 DIAGNOSIS — U071 COVID-19: Secondary | ICD-10-CM | POA: Diagnosis not present

## 2020-02-28 DIAGNOSIS — Z20828 Contact with and (suspected) exposure to other viral communicable diseases: Secondary | ICD-10-CM | POA: Diagnosis not present

## 2020-03-07 DIAGNOSIS — Z20828 Contact with and (suspected) exposure to other viral communicable diseases: Secondary | ICD-10-CM | POA: Diagnosis not present

## 2020-03-07 DIAGNOSIS — U071 COVID-19: Secondary | ICD-10-CM | POA: Diagnosis not present

## 2020-03-15 ENCOUNTER — Other Ambulatory Visit: Payer: Self-pay | Admitting: Physician Assistant

## 2020-03-15 DIAGNOSIS — I10 Essential (primary) hypertension: Secondary | ICD-10-CM

## 2020-03-15 NOTE — Telephone Encounter (Signed)
Pt. Has made an appointment.

## 2020-03-19 DIAGNOSIS — U071 COVID-19: Secondary | ICD-10-CM | POA: Diagnosis not present

## 2020-03-19 DIAGNOSIS — Z20828 Contact with and (suspected) exposure to other viral communicable diseases: Secondary | ICD-10-CM | POA: Diagnosis not present

## 2020-03-21 ENCOUNTER — Other Ambulatory Visit: Payer: Self-pay | Admitting: Physician Assistant

## 2020-03-21 DIAGNOSIS — Z1231 Encounter for screening mammogram for malignant neoplasm of breast: Secondary | ICD-10-CM

## 2020-03-26 DIAGNOSIS — U071 COVID-19: Secondary | ICD-10-CM | POA: Diagnosis not present

## 2020-03-26 DIAGNOSIS — Z20828 Contact with and (suspected) exposure to other viral communicable diseases: Secondary | ICD-10-CM | POA: Diagnosis not present

## 2020-03-28 ENCOUNTER — Ambulatory Visit
Admission: RE | Admit: 2020-03-28 | Discharge: 2020-03-28 | Disposition: A | Discharge status: Home / Self Care | Payer: 59 | Source: Ambulatory Visit | Attending: Physician Assistant | Admitting: Physician Assistant

## 2020-03-28 DIAGNOSIS — Z1231 Encounter for screening mammogram for malignant neoplasm of breast: Secondary | ICD-10-CM | POA: Diagnosis not present

## 2020-03-29 ENCOUNTER — Telehealth: Payer: Self-pay

## 2020-03-29 NOTE — Telephone Encounter (Signed)
LMTCB-if patient calls back ok for [facility] nurse to give results °

## 2020-03-29 NOTE — Telephone Encounter (Signed)
-----   Message from Mar Daring, PA-C sent at 03/29/2020 11:33 AM EDT ----- Normal mammogram. Repeat screening in one year.

## 2020-03-30 NOTE — Telephone Encounter (Signed)
Call to patient - notified PCP received results and she is good for repeat in a year

## 2020-05-17 ENCOUNTER — Other Ambulatory Visit: Payer: Self-pay

## 2020-05-17 ENCOUNTER — Ambulatory Visit (INDEPENDENT_AMBULATORY_CARE_PROVIDER_SITE_OTHER): Payer: 59 | Admitting: Physician Assistant

## 2020-05-17 ENCOUNTER — Encounter: Payer: Self-pay | Admitting: Physician Assistant

## 2020-05-17 VITALS — BP 131/81 | HR 66 | Temp 98.5°F | Resp 16 | Ht 65.0 in | Wt 267.8 lb

## 2020-05-17 DIAGNOSIS — E1165 Type 2 diabetes mellitus with hyperglycemia: Secondary | ICD-10-CM

## 2020-05-17 DIAGNOSIS — Z6841 Body Mass Index (BMI) 40.0 and over, adult: Secondary | ICD-10-CM

## 2020-05-17 DIAGNOSIS — I10 Essential (primary) hypertension: Secondary | ICD-10-CM | POA: Diagnosis not present

## 2020-05-17 DIAGNOSIS — E7800 Pure hypercholesterolemia, unspecified: Secondary | ICD-10-CM | POA: Diagnosis not present

## 2020-05-17 LAB — POCT GLYCOSYLATED HEMOGLOBIN (HGB A1C)
Est. average glucose Bld gHb Est-mCnc: 151
Hemoglobin A1C: 6.9 % — AB (ref 4.0–5.6)

## 2020-05-17 MED ORDER — HYDROCHLOROTHIAZIDE 50 MG PO TABS: 50.0000 mg | ORAL_TABLET | Freq: Every day | ORAL | 1 refills | Status: DC

## 2020-05-17 MED ORDER — LINAGLIPTIN 5 MG PO TABS: 5.0000 mg | ORAL_TABLET | Freq: Every day | ORAL | 1 refills | Status: DC

## 2020-05-17 MED ORDER — METFORMIN HCL 500 MG PO TABS: 1000.0000 mg | ORAL_TABLET | Freq: Two times a day (BID) | ORAL | 1 refills | Status: DC

## 2020-05-17 MED ORDER — AMLODIPINE BESYLATE 5 MG PO TABS: 5.0000 mg | ORAL_TABLET | Freq: Every day | ORAL | 1 refills | Status: DC

## 2020-05-17 MED ORDER — LOSARTAN POTASSIUM 100 MG PO TABS: 100.0000 mg | ORAL_TABLET | Freq: Every day | ORAL | 1 refills | Status: DC

## 2020-05-17 MED ORDER — PRAVASTATIN SODIUM 40 MG PO TABS: 40.0000 mg | ORAL_TABLET | Freq: Every day | ORAL | 1 refills | Status: DC

## 2020-05-17 MED ORDER — GLIPIZIDE ER 10 MG PO TB24: ORAL_TABLET | ORAL | 1 refills | Status: DC

## 2020-05-17 NOTE — Patient Instructions (Signed)
[facility] Dr. Alice Reichert:  Phone [phone]

## 2020-05-17 NOTE — Progress Notes (Signed)
Established patient visit   Patient: Jodi Becker   DOB: 24-May-1956   64 y.o. Female  MRN: [id number] Visit Date: 05/17/2020  Today's healthcare provider: Mar Daring, PA-C   Chief Complaint  Patient presents with  . Follow-up   Subjective    HPI  Diabetes Mellitus Type II, follow-up  Lab Results  Component Value Date   HGBA1C 6.9 (A) 05/17/2020   HGBA1C 9.8 (H) 08/25/2019   HGBA1C 8.2 03/13/2017   Last seen for diabetes 6-9 months ago.  Management since then includes Continue current medical treatment plan. She reports excellent compliance with treatment. She is not having side effects. none  Home blood sugar records: not being checked  Episodes of hypoglycemia? Not being checked.   Current insulin regiment: none Most Recent Eye Exam: 09/2019  --------------------------------------------------------------------------------------------------- Hypertension, follow-up  BP Readings from Last 3 Encounters:  05/17/20 131/81  10/11/19 108/64  08/25/19 (!) 144/82   Wt Readings from Last 3 Encounters:  05/17/20 267 lb 12.8 oz (121.5 kg)  10/11/19 267 lb (121.1 kg)  08/25/19 267 lb (121.1 kg)     She was last seen for hypertension 6-9 months ago.  BP at that visit was 144/82 Management since that visit includes Continue current medical treatment plan She reports excellent compliance with treatment. She is not having side effects.  She is not exercising. She is not adherent to low salt diet.   Outside blood pressures are 140-130/80.  She does not smoke.  --------------------------------------------------------------------------------------------------- Lipid/Cholesterol, follow-up  Last Lipid Panel: Lab Results  Component Value Date   CHOL 134 08/25/2019   LDLCALC 57 08/25/2019   HDL 56 08/25/2019   TRIG 117 08/25/2019    She was last seen for this 6-9 months ago.  Management since that visit includesContinue Pravastatin 40mg    .  She reports excellent compliance with treatment. She is not having side effects.   Symptoms: No appetite changes No foot ulcerations  No chest pain No chest pressure/discomfort  No dyspnea No orthopnea  No fatigue Yes lower extremity edema  No palpitations No paroxysmal nocturnal dyspnea  No nausea No numbness or tingling of extremity  No polydipsia No polyuria  No speech difficulty No syncope    Last metabolic panel Lab Results  Component Value Date   GLUCOSE 232 (H) 08/25/2019   NA 137 08/25/2019   K 4.3 08/25/2019   BUN 15 08/25/2019   CREATININE 0.73 08/25/2019   GFRNONAA 88 08/25/2019   GFRAA 101 08/25/2019   CALCIUM 11.2 (H) 08/25/2019   AST 30 08/25/2019   ALT 57 (H) 08/25/2019   The 10-year ASCVD risk score Mikey Bussing DC Jr., et al., 2013) is: 13.7%  ---------------------------------------------------------------------------------------------------     Medications: Outpatient Medications Prior to Visit  Medication Sig  . amLODipine (NORVASC) 5 MG tablet Take 1 tablet (5 mg total) by mouth daily.  Marland Kitchen aspirin 81 MG chewable tablet Chew by mouth.  . Cholecalciferol (VITAMIN D3) 1000 UNITS CAPS Take by mouth.  Marland Kitchen glipiZIDE (GLUCOTROL XL) 10 MG 24 hr tablet Take 1 tablet by mouth once daily with breakfast  . hydrochlorothiazide (HYDRODIURIL) 50 MG tablet Take 1 tablet (50 mg total) by mouth daily.  Marland Kitchen linagliptin (TRADJENTA) 5 MG TABS tablet Take 1 tablet (5 mg total) by mouth daily.  Marland Kitchen losartan (COZAAR) 100 MG tablet Take 1 tablet by mouth once daily  . metFORMIN (GLUCOPHAGE) 500 MG tablet Take 2 tablets (1,000 mg total) by mouth 2 (two) times daily.  Marland Kitchen  pravastatin (PRAVACHOL) 40 MG tablet Take 1 tablet (40 mg total) by mouth daily.  . fluticasone (FLONASE) 50 MCG/ACT nasal spray Place 2 sprays into both nostrils daily.   No facility-administered medications prior to visit.    Review of Systems  Constitutional: Negative for appetite change, chills, fatigue and  fever.  Respiratory: Negative for chest tightness and shortness of breath.   Cardiovascular: Positive for leg swelling ("at the end of the day"). Negative for chest pain and palpitations.  Gastrointestinal: Negative for abdominal pain, nausea and vomiting.  Neurological: Negative for dizziness and weakness.      Objective    BP 131/81 (BP Location: Left Arm, Patient Position: Sitting, Cuff Size: Large)   Pulse 66   Temp 98.5 F (36.9 C) (Oral)   Resp 16   Ht 5\' 5"  (1.651 m)   Wt 267 lb 12.8 oz (121.5 kg)   BMI 44.56 kg/m    Physical Exam Vitals reviewed.  Constitutional:      General: She is not in acute distress.    Appearance: Normal appearance. She is well-developed. She is obese. She is not ill-appearing or diaphoretic.  Cardiovascular:     Rate and Rhythm: Normal rate and regular rhythm.     Heart sounds: Normal heart sounds. No murmur heard.  No friction rub. No gallop.   Pulmonary:     Effort: Pulmonary effort is normal. No respiratory distress.     Breath sounds: Normal breath sounds. No wheezing or rales.  Musculoskeletal:     Cervical back: Normal range of motion and neck supple.  Neurological:     Mental Status: She is alert.      Results for orders placed or performed in visit on 05/17/20  POCT glycosylated hemoglobin (Hb A1C)  Result Value Ref Range   Hemoglobin A1C 6.9 (A) 4.0 - 5.6 %   Est. average glucose Bld gHb Est-mCnc 151     Assessment & Plan     1. Type 2 diabetes mellitus with hyperglycemia, without long-term current use of insulin (HCC) Much improved from 9.8 to now 6.9. Diagnosis pulled for medication refill. Continue current medical treatment plan. F/U in 3 months for CPE.  - POCT glycosylated hemoglobin (Hb A1C) - glipiZIDE (GLUCOTROL XL) 10 MG 24 hr tablet; Take 1 tablet by mouth once daily with breakfast  Dispense: 90 tablet; Refill: 1 - linagliptin (TRADJENTA) 5 MG TABS tablet; Take 1 tablet (5 mg total) by mouth daily.  Dispense: 90  tablet; Refill: 1 - metFORMIN (GLUCOPHAGE) 500 MG tablet; Take 2 tablets (1,000 mg total) by mouth 2 (two) times daily.  Dispense: 360 tablet; Refill: 1  2. Benign hypertension Stable. Diagnosis pulled for medication refill. Continue current medical treatment plan.  - amLODipine (NORVASC) 5 MG tablet; Take 1 tablet (5 mg total) by mouth daily.  Dispense: 90 tablet; Refill: 1 - hydrochlorothiazide (HYDRODIURIL) 50 MG tablet; Take 1 tablet (50 mg total) by mouth daily.  Dispense: 90 tablet; Refill: 1 - losartan (COZAAR) 100 MG tablet; Take 1 tablet (100 mg total) by mouth daily.  Dispense: 90 tablet; Refill: 1  3. Pure hypercholesterolemia Stable. Diagnosis pulled for medication refill. Continue current medical treatment plan. - pravastatin (PRAVACHOL) 40 MG tablet; Take 1 tablet (40 mg total) by mouth daily.  Dispense: 90 tablet; Refill: 1  4. Class 3 severe obesity due to excess calories with serious comorbidity and body mass index (BMI) of 40.0 to 44.9 in adult [facility]) Counseled patient on healthy lifestyle modifications including  dieting and exercise.    No follow-ups on file.      Reynolds Bowl, PA-C, have reviewed all documentation for this visit. The documentation on 05/17/20 for the exam, diagnosis, procedures, and orders are all accurate and complete.   Rubye Beach  [facility] [phone] (phone) [phone] (fax)  [place]

## 2020-08-03 ENCOUNTER — Telehealth: Payer: Self-pay | Admitting: *Deleted

## 2020-08-03 DIAGNOSIS — Z85038 Personal history of other malignant neoplasm of large intestine: Secondary | ICD-10-CM

## 2020-08-03 NOTE — Telephone Encounter (Signed)
Referral placed.

## 2020-08-03 NOTE — Telephone Encounter (Signed)
Copied from [place] [id number]. Topic: Referral - Status >> Aug 03, 2020 11:55 AM Lennox Solders wrote: Reason for CRM: Pt is calling and her referral to see dr Alice Reichert, teo GI specialist has expired and they need another referral. Pt needs colonoscopy

## 2020-09-04 ENCOUNTER — Telehealth: Payer: Self-pay | Admitting: Physician Assistant

## 2020-09-04 ENCOUNTER — Other Ambulatory Visit: Payer: Self-pay

## 2020-09-04 ENCOUNTER — Ambulatory Visit (INDEPENDENT_AMBULATORY_CARE_PROVIDER_SITE_OTHER): Payer: 59 | Admitting: Family Medicine

## 2020-09-04 ENCOUNTER — Telehealth: Payer: Self-pay

## 2020-09-04 ENCOUNTER — Encounter: Payer: Self-pay | Admitting: Family Medicine

## 2020-09-04 DIAGNOSIS — J301 Allergic rhinitis due to pollen: Secondary | ICD-10-CM

## 2020-09-04 MED ORDER — FLUTICASONE PROPIONATE 50 MCG/ACT NA SUSP: 2.0000 | Freq: Every day | NASAL | 6 refills | Status: DC

## 2020-09-04 NOTE — Telephone Encounter (Signed)
Copied from [place] [id number]. Topic: General - Other >> Sep 04, 2020  9:57 AM Celene Kras wrote: Reason for CRM: Pt called stating that she will missed her VV this morning. Please advise.

## 2020-09-04 NOTE — Telephone Encounter (Signed)
Patient is calling she had appt today over the phone with Simona Huh for Allergic rhinitis due to pollen, unspecified seasonality. Patient was told to take OTC medications. Patient did pick up the spray OTC and she is fine with that. Patient is calling back to request an antibiotic. Preferred [place]
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
## 2020-09-05 MED ORDER — LORATADINE 10 MG PO TABS: 10.0000 mg | ORAL_TABLET | Freq: Every day | ORAL | 11 refills | Status: DC

## 2020-09-05 NOTE — Telephone Encounter (Signed)
Pt given message per notes of Joette Catching, PA on 09/05/20. Pt verbalized understanding. Patient will try loratadine 10mg  and call back if symptoms do not improve.

## 2020-09-05 NOTE — Telephone Encounter (Signed)
Allergies are not bacterial and do not require an antibiotic. She should start the nasal spray as directed by Simona Huh and should also add Loratadine 10mg  as well. I will send that in also. If symptoms do not improve within 7-10 days call back.

## 2020-09-05 NOTE — Telephone Encounter (Signed)
LMTCB-if patient calls back ok for [facility] nurse to read providers message.

## 2020-09-13 ENCOUNTER — Ambulatory Visit: Payer: Self-pay | Admitting: Physician Assistant
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
## 2020-09-17 ENCOUNTER — Other Ambulatory Visit: Payer: Self-pay

## 2020-09-17 ENCOUNTER — Ambulatory Visit (INDEPENDENT_AMBULATORY_CARE_PROVIDER_SITE_OTHER): Payer: 59 | Admitting: Physician Assistant

## 2020-09-17 ENCOUNTER — Telehealth: Payer: Self-pay

## 2020-09-17 ENCOUNTER — Encounter: Payer: Self-pay | Admitting: Physician Assistant

## 2020-09-17 VITALS — BP 131/71 | HR 74 | Temp 98.6°F | Resp 16 | Ht 65.0 in | Wt 270.0 lb

## 2020-09-17 DIAGNOSIS — E1169 Type 2 diabetes mellitus with other specified complication: Secondary | ICD-10-CM | POA: Diagnosis not present

## 2020-09-17 DIAGNOSIS — E7800 Pure hypercholesterolemia, unspecified: Secondary | ICD-10-CM

## 2020-09-17 DIAGNOSIS — I10 Essential (primary) hypertension: Secondary | ICD-10-CM

## 2020-09-17 DIAGNOSIS — E785 Hyperlipidemia, unspecified: Secondary | ICD-10-CM | POA: Diagnosis not present

## 2020-09-17 DIAGNOSIS — Z6841 Body Mass Index (BMI) 40.0 and over, adult: Secondary | ICD-10-CM | POA: Diagnosis not present

## 2020-09-17 DIAGNOSIS — Z0000 Encounter for general adult medical examination without abnormal findings: Secondary | ICD-10-CM

## 2020-09-17 NOTE — Patient Instructions (Signed)

## 2020-09-17 NOTE — Telephone Encounter (Signed)
Copied from [place] [id number]. Topic: General - Other >> Sep 17, 2020  8:40 AM Leward Quan A wrote: Reason for CRM: Olin Hauser from [facility] called in to get information need CPT code for procedure ordered for patient Please advise Ph# [phone]

## 2020-09-17 NOTE — Telephone Encounter (Signed)
I have no clue. She will have to contact GI, whoever she is seeing. It is for a colonoscopy.

## 2020-09-18 LAB — COMPREHENSIVE METABOLIC PANEL
ALT: 40 IU/L — ABNORMAL HIGH (ref 0–32)
AST: 24 IU/L (ref 0–40)
Albumin/Globulin Ratio: 1.5 (ref 1.2–2.2)
Albumin: 4.3 g/dL (ref 3.8–4.8)
Alkaline Phosphatase: 98 IU/L (ref 44–121)
BUN/Creatinine Ratio: 17 (ref 12–28)
BUN: 14 mg/dL (ref 8–27)
Bilirubin Total: 0.4 mg/dL (ref 0.0–1.2)
CO2: 23 mmol/L (ref 20–29)
Calcium: 11.2 mg/dL — ABNORMAL HIGH (ref 8.7–10.3)
Chloride: 101 mmol/L (ref 96–106)
Creatinine, Ser: 0.81 mg/dL (ref 0.57–1.00)
GFR calc Af Amer: 89 mL/min/{1.73_m2} (ref >59)
GFR calc non Af Amer: 77 mL/min/{1.73_m2} (ref >59)
Globulin, Total: 2.8 g/dL (ref 1.5–4.5)
Glucose: 192 mg/dL — ABNORMAL HIGH (ref 65–99)
Potassium: 4.1 mmol/L (ref 3.5–5.2)
Sodium: 141 mmol/L (ref 134–144)
Total Protein: 7.1 g/dL (ref 6.0–8.5)

## 2020-09-18 LAB — CBC WITH DIFFERENTIAL/PLATELET
Basophils Absolute: 0.1 10*3/uL (ref 0.0–0.2)
Basos: 1 %
EOS (ABSOLUTE): 0.2 10*3/uL (ref 0.0–0.4)
Eos: 4 %
Hematocrit: 40.7 % (ref 34.0–46.6)
Hemoglobin: 12.9 g/dL (ref 11.1–15.9)
Immature Grans (Abs): 0 10*3/uL (ref 0.0–0.1)
Immature Granulocytes: 1 %
Lymphocytes Absolute: 1.9 10*3/uL (ref 0.7–3.1)
Lymphs: 32 %
MCH: 26.5 pg — ABNORMAL LOW (ref 26.6–33.0)
MCHC: 31.7 g/dL (ref 31.5–35.7)
MCV: 84 fL (ref 79–97)
Monocytes Absolute: 0.5 10*3/uL (ref 0.1–0.9)
Monocytes: 9 %
Neutrophils Absolute: 3.2 10*3/uL (ref 1.4–7.0)
Neutrophils: 53 %
Platelets: 227 10*3/uL (ref 150–450)
RBC: 4.87 x10E6/uL (ref 3.77–5.28)
RDW: 13.8 % (ref 11.7–15.4)
WBC: 6 10*3/uL (ref 3.4–10.8)

## 2020-09-18 LAB — HEMOGLOBIN A1C
Est. average glucose Bld gHb Est-mCnc: 194 mg/dL
Hgb A1c MFr Bld: 8.4 % — ABNORMAL HIGH (ref 4.8–5.6)

## 2020-09-18 LAB — LIPID PANEL
Chol/HDL Ratio: 2.1 ratio (ref 0.0–4.4)
Cholesterol, Total: 138 mg/dL (ref 100–199)
HDL: 66 mg/dL (ref >39)
LDL Chol Calc (NIH): 53 mg/dL (ref 0–99)
Triglycerides: 107 mg/dL (ref 0–149)
VLDL Cholesterol Cal: 19 mg/dL (ref 5–40)

## 2020-09-18 LAB — TSH: TSH: 1.06 u[IU]/mL (ref 0.450–4.500)

## 2020-09-18 NOTE — Telephone Encounter (Signed)
Patient advised.

## 2020-09-24 DIAGNOSIS — U071 COVID-19: Secondary | ICD-10-CM | POA: Diagnosis not present

## 2020-09-24 DIAGNOSIS — Z20828 Contact with and (suspected) exposure to other viral communicable diseases: Secondary | ICD-10-CM | POA: Diagnosis not present

## 2020-11-19 ENCOUNTER — Other Ambulatory Visit: Payer: Self-pay | Admitting: Physician Assistant

## 2020-11-19 DIAGNOSIS — E1165 Type 2 diabetes mellitus with hyperglycemia: Secondary | ICD-10-CM

## 2020-11-19 NOTE — Telephone Encounter (Signed)
Medication Refill - Medication: Tradjenta   Has the patient contacted their pharmacy? Yes.  Pt states she contacted pharmacy, but that they were needing pt to contact PCP. Please advise.  (Agent: If no, request that the patient contact the pharmacy for the refill.) (Agent: If yes, when and what did the pharmacy advise?)  Preferred Pharmacy (with phone number or street name):  [facility] [address] [place]  [place] [address]  Phone: [phone] Fax: [phone]  Hours: Not open 24 hours     Agent: Please be advised that RX refills may take up to 3 business days. We ask that you follow-up with your pharmacy.

## 2020-11-25 ENCOUNTER — Other Ambulatory Visit: Payer: Self-pay | Admitting: Physician Assistant

## 2020-11-25 DIAGNOSIS — E7800 Pure hypercholesterolemia, unspecified: Secondary | ICD-10-CM

## 2020-12-11 ENCOUNTER — Other Ambulatory Visit: Payer: Self-pay | Admitting: Physician Assistant

## 2020-12-11 DIAGNOSIS — I10 Essential (primary) hypertension: Secondary | ICD-10-CM

## 2020-12-11 DIAGNOSIS — E1165 Type 2 diabetes mellitus with hyperglycemia: Secondary | ICD-10-CM

## 2020-12-17 ENCOUNTER — Ambulatory Visit: Payer: Self-pay | Admitting: Physician Assistant

## 2020-12-26 ENCOUNTER — Other Ambulatory Visit: Payer: Self-pay | Admitting: Physician Assistant

## 2020-12-26 DIAGNOSIS — E1165 Type 2 diabetes mellitus with hyperglycemia: Secondary | ICD-10-CM

## 2021-01-02 NOTE — Progress Notes (Signed)
Established patient visit   Patient: Jodi Becker   DOB: 1955-11-30   65 y.o. Female  MRN: [id number] Visit Date: 01/03/2021  Today's healthcare provider: Mar Daring, PA-C   Chief Complaint  Patient presents with  . Hyperlipidemia  . Hypertension   Subjective    HPI  Diabetes Mellitus Type II, Follow-up  Lab Results  Component Value Date   HGBA1C 9.0 (A) 01/03/2021   HGBA1C 8.4 (H) 09/17/2020   HGBA1C 6.9 (A) 05/17/2020   Wt Readings from Last 3 Encounters:  01/03/21 273 lb (123.8 kg)  09/17/20 270 lb (122.5 kg)  05/17/20 267 lb 12.8 oz (121.5 kg)   Last seen for diabetes 3 months ago.  Management since then includes none; continue current medications. She reports excellent compliance with treatment. She is not having side effects.  Symptoms: Yes fatigue No foot ulcerations  No appetite changes No nausea  No paresthesia of the feet  No polydipsia  No polyuria  No visual disturbances   No vomiting     Home blood sugar records: n/a  Episodes of hypoglycemia? No    Current insulin regiment: none Most Recent Eye Exam: not UTD Current exercise: walking - at work Current diet habits: in general, an "unhealthy" diet  Pertinent Labs: Lab Results  Component Value Date   CHOL 138 09/17/2020   HDL 66 09/17/2020   LDLCALC 53 09/17/2020   TRIG 107 09/17/2020   CHOLHDL 2.1 09/17/2020   Lab Results  Component Value Date   NA 141 09/17/2020   K 4.1 09/17/2020   CREATININE 0.81 09/17/2020   GFRNONAA 77 09/17/2020   GFRAA 89 09/17/2020   GLUCOSE 192 (H) 09/17/2020     --------------------------------------------------------------------------------------------------- Lipid/Cholesterol, Follow-up  Last lipid panel Other pertinent labs  Lab Results  Component Value Date   CHOL 138 09/17/2020   HDL 66 09/17/2020   LDLCALC 53 09/17/2020   TRIG 107 09/17/2020   CHOLHDL 2.1 09/17/2020   Lab Results  Component Value Date   ALT 40 (H)  09/17/2020   AST 24 09/17/2020   PLT 227 09/17/2020   TSH 1.060 09/17/2020     She was last seen for this 3 months ago.  Management since that visit includes none; continue current medications.  She reports excellent compliance with treatment. She is not having side effects.   Symptoms: No chest pain No chest pressure/discomfort  No dyspnea Yes lower extremity edema  No numbness or tingling of extremity No orthopnea  Yes palpitations No paroxysmal nocturnal dyspnea  No speech difficulty No syncope   Current diet: in general, an "unhealthy" diet Current exercise: walking  The 10-year ASCVD risk score Mikey Bussing DC Jr., et al., 2013) is: 16.3%  --------------------------------------------------------------------------------------------------- Hypertension, follow-up  BP Readings from Last 3 Encounters:  01/03/21 139/77  09/17/20 131/71  05/17/20 131/81   Wt Readings from Last 3 Encounters:  01/03/21 273 lb (123.8 kg)  09/17/20 270 lb (122.5 kg)  05/17/20 267 lb 12.8 oz (121.5 kg)     She was last seen for hypertension 3 months ago.  BP at that visit was 131/71. Management since that visit includes none; continue current medications.  She reports excellent compliance with treatment. She is not having side effects.  She is following a Regular diet. She is exercising. She does not smoke.  Use of agents associated with hypertension: NSAIDS.   Outside blood pressures are n/a. Symptoms: No chest pain No chest pressure  No palpitations No syncope  No dyspnea No orthopnea  No paroxysmal nocturnal dyspnea Yes lower extremity edema   Pertinent labs: Lab Results  Component Value Date   CHOL 138 09/17/2020   HDL 66 09/17/2020   LDLCALC 53 09/17/2020   TRIG 107 09/17/2020   CHOLHDL 2.1 09/17/2020   Lab Results  Component Value Date   NA 141 09/17/2020   K 4.1 09/17/2020   CREATININE 0.81 09/17/2020   GFRNONAA 77 09/17/2020   GFRAA 89 09/17/2020   GLUCOSE 192 (H)  09/17/2020     The 10-year ASCVD risk score Mikey Bussing DC Jr., et al., 2013) is: 16.3%   ---------------------------------------------------------------------------------------------------   Patient Active Problem List   Diagnosis Date Noted  . Class 3 severe obesity due to excess calories with serious comorbidity and body mass index (BMI) of 40.0 to 44.9 in adult ([place]) 05/17/2020  . Diabetes mellitus type 2, controlled, without complications ([place]) 30/04/6225  . Allergic rhinitis 07/09/2015  . Eustachian tube dysfunction 07/09/2015  . Abnormal ECG 04/25/2015  . History of colon cancer 04/25/2015  . Diastolic dysfunction 33/35/4562  . Generalized headache 04/25/2015  . Cannot sleep 04/25/2015  . 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 Hyperlipemia 04/20/2015  . Anterior tibial tendonitis 05/05/2014  . Benign hypertension 05/24/2009   Past Medical History:  Diagnosis Date  . Cancer ([place])    colon,uterine, Lymphoma  . Personal history of chemotherapy        Medications: Outpatient Medications Prior to Visit  Medication Sig  . amLODipine (NORVASC) 5 MG tablet Take 1 tablet (5 mg total) by mouth daily.  Marland Kitchen aspirin 81 MG chewable tablet Chew by mouth.  . Cholecalciferol (VITAMIN D3) 1000 UNITS CAPS Take by mouth.  . fluticasone (FLONASE) 50 MCG/ACT nasal spray Place 2 sprays into both nostrils daily.  Marland Kitchen glipiZIDE (GLUCOTROL XL) 10 MG 24 hr tablet Take 1 tablet by mouth once daily with breakfast  . hydrochlorothiazide (HYDRODIURIL) 50 MG tablet Take 1 tablet (50 mg total) by mouth daily.  Marland Kitchen loratadine (CLARITIN) 10 MG tablet Take 1 tablet (10 mg total) by mouth daily.  Marland Kitchen losartan (COZAAR) 100 MG tablet Take 1 tablet by mouth once daily  . metFORMIN (GLUCOPHAGE) 500 MG tablet Take 2 tablets by mouth twice daily  . pravastatin (PRAVACHOL) 40 MG tablet Take 1 tablet by mouth once daily  . [DISCONTINUED] TRADJENTA 5 MG TABS tablet TAKE 1 TABLET BY MOUTH DAILY   No facility-administered  medications prior to visit.    Review of Systems  Constitutional: Negative.   HENT: Negative.   Respiratory: Negative.   Cardiovascular: Negative.   Endocrine: Negative.   Neurological: Negative.     Last CBC Lab Results  Component Value Date   WBC 6.0 09/17/2020   HGB 12.9 09/17/2020   HCT 40.7 09/17/2020   MCV 84 09/17/2020   MCH 26.5 (L) 09/17/2020   RDW 13.8 09/17/2020   PLT 227 56/38/9373   Last metabolic panel Lab Results  Component Value Date   GLUCOSE 192 (H) 09/17/2020   NA 141 09/17/2020   K 4.1 09/17/2020   CL 101 09/17/2020   CO2 23 09/17/2020   BUN 14 09/17/2020   CREATININE 0.81 09/17/2020   GFRNONAA 77 09/17/2020   GFRAA 89 09/17/2020   CALCIUM 11.2 (H) 09/17/2020   PROT 7.1 09/17/2020   ALBUMIN 4.3 09/17/2020   LABGLOB 2.8 09/17/2020   AGRATIO 1.5 09/17/2020   BILITOT 0.4 09/17/2020   ALKPHOS 98 09/17/2020   AST 24 09/17/2020   ALT  40 (H) 09/17/2020       Objective    BP 139/77 (BP Location: Right Arm, Patient Position: Sitting, Cuff Size: Large)   Pulse 77   Ht 5\' 5"  (1.651 m)   Wt 273 lb (123.8 kg)   SpO2 100%   BMI 45.43 kg/m  BP Readings from Last 3 Encounters:  01/03/21 139/77  09/17/20 131/71  05/17/20 131/81   Wt Readings from Last 3 Encounters:  01/03/21 273 lb (123.8 kg)  09/17/20 270 lb (122.5 kg)  05/17/20 267 lb 12.8 oz (121.5 kg)       Physical Exam Vitals reviewed.  Constitutional:      General: She is not in acute distress.    Appearance: Normal appearance. She is well-developed. She is obese. She is not ill-appearing or diaphoretic.  HENT:     Head: Normocephalic and atraumatic.  Cardiovascular:     Rate and Rhythm: Normal rate and regular rhythm.     Heart sounds: Normal heart sounds. No murmur heard. No friction rub. No gallop.   Pulmonary:     Effort: Pulmonary effort is normal. No respiratory distress.     Breath sounds: Normal breath sounds. No wheezing or rales.  Musculoskeletal:     Cervical back:  Normal range of motion and neck supple.  Neurological:     Mental Status: She is alert.     Results for orders placed or performed in visit on 01/03/21  POCT HgB A1C  Result Value Ref Range   Hemoglobin A1C 9.0 (A) 4.0 - 5.6 %   HbA1c POC (<> result, manual entry)     HbA1c, POC (prediabetic range)     HbA1c, POC (controlled diabetic range)      Assessment & Plan     1. Hyperlipidemia associated with type 2 diabetes mellitus (HCC) A1c increased to 9.0 from 8.4. Continue glipizide XL 10mg , metformin 500mg  (takes 1000mg  BID). Add Trulicity as below. On statin. On ARB. F/U in 3 months.  - POCT HgB A1C  2. Controlled type 2 diabetes mellitus with hyperglycemia, without long-term current use of insulin ([place]) See above medical treatment plan. - Dulaglutide (TRULICITY) 3.49 ZP/9.1TA SOPN; Inject 0.75 mg into the skin once a week.  Dispense: 6 mL; Refill: 4   No follow-ups on file.      Reynolds Bowl, PA-C, have reviewed all documentation for this visit. The documentation on 01/18/21 for the exam, diagnosis, procedures, and orders are all accurate and complete.   Rubye Beach  [facility] [phone] (phone) [phone] (fax)  [place]

## 2021-01-03 ENCOUNTER — Ambulatory Visit: Payer: 59 | Admitting: Physician Assistant

## 2021-01-03 ENCOUNTER — Other Ambulatory Visit: Payer: Self-pay

## 2021-01-03 VITALS — BP 139/77 | HR 77 | Ht 65.0 in | Wt 273.0 lb

## 2021-01-03 DIAGNOSIS — E785 Hyperlipidemia, unspecified: Secondary | ICD-10-CM

## 2021-01-03 DIAGNOSIS — E1165 Type 2 diabetes mellitus with hyperglycemia: Secondary | ICD-10-CM | POA: Diagnosis not present

## 2021-01-03 DIAGNOSIS — E1169 Type 2 diabetes mellitus with other specified complication: Secondary | ICD-10-CM | POA: Diagnosis not present

## 2021-01-03 LAB — POCT GLYCOSYLATED HEMOGLOBIN (HGB A1C): Hemoglobin A1C: 9 % — AB (ref 4.0–5.6)

## 2021-01-03 MED ORDER — TRULICITY 0.75 MG/0.5ML ~~LOC~~ SOAJ
0.7500 mg | SUBCUTANEOUS | 4 refills | Status: DC
Start: 2021-01-03 — End: 2021-04-12

## 2021-01-03 NOTE — Patient Instructions (Signed)
Dulaglutide Injection What is this medicine? DULAGLUTIDE (DOO la GLOO tide) controls blood sugar in people with type 2 diabetes. It is used with lifestyle changes like diet and exercise. It may lower the risk of problems that need treatment in the hospital. These problems include heart attack or stroke. This medicine may be used for other purposes; ask your health care provider or pharmacist if you have questions. COMMON BRAND NAME(S): Trulicity What should I tell my health care provider before I take this medicine? They need to know if you have any of these conditions:  endocrine tumors (MEN 2) or if someone in your family had these tumors  eye disease, vision problems  history of pancreatitis  kidney disease  liver disease  stomach or intestine problems  thyroid cancer or if someone in your family had thyroid cancer  an unusual or allergic reaction to dulaglutide, other medicines, foods, dyes, or preservatives  pregnant or trying to get pregnant  breast-feeding How should I use this medicine? This medicine is injected under the skin. You will be taught how to prepare and give it. Take it as directed on the prescription label on the same day of each week. Do NOT prime the pen. Keep taking it unless your health care provider tells you to stop. If you use this medicine with insulin, you should inject this medicine and the insulin separately. Do not mix them together. Do not give the injections right next to each other. Change (rotate) injection sites with each injection. This drug comes with INSTRUCTIONS FOR USE. Ask your pharmacist for directions on how to use this medicine. Read the information carefully. Talk to your pharmacist or health care provider if you have questions. It is important that you put your used needles and syringes in a special sharps container. Do not put them in a trash can. If you do not have a sharps container, call your pharmacist or health care provider to get  one. A special MedGuide will be given to you by the pharmacist with each prescription and refill. Be sure to read this information carefully each time. Talk to your health care provider about the use of this medicine in children. Special care may be needed. Overdosage: If you think you have taken too much of this medicine contact a poison control center or emergency room at once. NOTE: This medicine is only for you. Do not share this medicine with others. What if I miss a dose? If you miss a dose, take it as soon as you can unless it is more than 3 days late. If it is more than 3 days late, skip the missed dose. Take the next dose at the normal time. What may interact with this medicine?  other medicines for diabetes Many medications may cause changes in blood sugar, these include:  alcohol containing beverages  antiviral medicines for HIV or AIDS  aspirin and aspirin-like drugs  certain medicines for blood pressure, heart disease, irregular heart beat  chromium  diuretics  female hormones, such as estrogens or progestins, birth control pills  fenofibrate  gemfibrozil  isoniazid  lanreotide  female hormones or anabolic steroids  MAOIs like Carbex, Eldepryl, Marplan, Nardil, and Parnate  medicines for allergies, asthma, cold, or cough  medicines for depression, anxiety, or psychotic disturbances  medicines for weight loss  niacin  nicotine  NSAIDs, medicines for pain and inflammation, like ibuprofen or naproxen  octreotide  pasireotide  pentamidine  phenytoin  probenecid  quinolone antibiotics such as ciprofloxacin,  levofloxacin, ofloxacin  some herbal dietary supplements  steroid medicines such as prednisone or cortisone  sulfamethoxazole; trimethoprim  thyroid hormones Some medications can hide the warning symptoms of low blood sugar (hypoglycemia). You may need to monitor your blood sugar more closely if you are taking one of these medications.  These include:  beta-blockers, often used for high blood pressure or heart problems (examples include atenolol, metoprolol, propranolol)  clonidine  guanethidine  reserpine This list may not describe all possible interactions. Give your health care provider a list of all the medicines, herbs, non-prescription drugs, or dietary supplements you use. Also tell them if you smoke, drink alcohol, or use illegal drugs. Some items may interact with your medicine. What should I watch for while using this medicine? Visit your health care provider for regular checks on your progress. Check with your health care provider if you have severe diarrhea, nausea, and vomiting, or if you sweat a lot. The loss of too much body fluid may make it dangerous for you to take this medicine. A test called the HbA1C (A1C) will be monitored. This is a simple blood test. It measures your blood sugar control over the last 2 to 3 months. You will receive this test every 3 to 6 months. Learn how to check your blood sugar. Learn the symptoms of low and high blood sugar and how to manage them. Always carry a quick-source of sugar with you in case you have symptoms of low blood sugar. Examples include hard sugar candy or glucose tablets. Make sure others know that you can choke if you eat or drink when you develop serious symptoms of low blood sugar, such as seizures or unconsciousness. Get medical help at once. Tell your health care provider if you have high blood sugar. You might need to change the dose of your medicine. If you are sick or exercising more than usual, you may need to change the dose of your medicine. Do not skip meals. Ask your health care provider if you should avoid alcohol. Many nonprescription cough and cold products contain sugar or alcohol. These can affect blood sugar. Pens should never be shared. Even if the needle is changed, sharing may result in passing of viruses like hepatitis or HIV. Wear a medical ID  bracelet or chain. Carry a card that describes your condition. List the medicines and doses you take on the card. What side effects may I notice from receiving this medicine? Side effects that you should report to your doctor or health care professional as soon as possible:  allergic reactions (skin rash, itching or hives; swelling of the face, lips, or tongue)  changes in vision  diarrhea that continues or is severe  infection (fever, chills, cough, sore throat, pain or trouble passing urine)  kidney injury (trouble passing urine or change in the amount of urine)  low blood sugar (feeling anxious; confusion; dizziness; increased hunger; unusually weak or tired; increased sweating; shakiness; cold, clammy skin; irritable; headache; blurred vision; fast heartbeat; loss of consciousness)  lump or swelling on the neck  trouble breathing  trouble swallowing  unusual stomach upset or pain  vomiting Side effects that usually do not require medical attention (report to your doctor or health care professional if they continue or are bothersome):  lack or loss of appetite  nausea  pain, redness, or irritation at site where injected This list may not describe all possible side effects. Call your doctor for medical advice about side effects. You may report  side effects to FDA at 1-800-FDA-1088. Where should I keep my medicine? Keep out of the reach of children and pets. Refrigeration (preferred): Store unopened pens in a refrigerator between 2 and 8 degrees C (36 and 46 degrees F). Keep it in the original carton until you are ready to take it. Do not freeze or use if the medicine has been frozen. Protect from light. Get rid of any unused medicine after the expiration date on the label. Room Temperature: The pen may be stored at room temperature below 30 degrees C (86 degrees F) for up to a total of 14 days if needed. Protect from light. Avoid exposure to extreme heat. If it is stored at room  temperature, throw away any unused medicine after 14 days or after it expires, whichever is first. To get rid of medicines that are no longer needed or have expired:  Take the medicine to a medicine take-back program. Check with your pharmacy or law enforcement to find a location.  If you cannot return the medicine, ask your pharmacist or health care provider how to get rid of this medicine safely. NOTE: This sheet is a summary. It may not cover all possible information. If you have questions about this medicine, talk to your doctor, pharmacist, or health care provider.  2021 [employer]vier/Gold Standard (2020-07-30 07:35:51)

## 2021-01-18 ENCOUNTER — Encounter: Payer: Self-pay | Admitting: Physician Assistant

## 2021-01-21 ENCOUNTER — Telehealth: Payer: Self-pay

## 2021-01-21 DIAGNOSIS — I10 Essential (primary) hypertension: Secondary | ICD-10-CM

## 2021-01-21 MED ORDER — HYDROCHLOROTHIAZIDE 50 MG PO TABS: 50.0000 mg | ORAL_TABLET | Freq: Every day | ORAL | 1 refills | Status: DC

## 2021-01-21 MED ORDER — LORATADINE 10 MG PO TABS: 10.0000 mg | ORAL_TABLET | Freq: Every day | ORAL | 1 refills | Status: DC

## 2021-01-21 NOTE — Telephone Encounter (Signed)
[facility] faxed refill request for the following medications:  hydrochlorothiazide (HYDRODIURIL) 50 MG tablet   loratadine (CLARITIN) 10 MG tablet    Please advise.

## 2021-03-06 ENCOUNTER — Other Ambulatory Visit: Payer: Self-pay | Admitting: Physician Assistant

## 2021-03-06 DIAGNOSIS — I10 Essential (primary) hypertension: Secondary | ICD-10-CM

## 2021-03-06 DIAGNOSIS — E1165 Type 2 diabetes mellitus with hyperglycemia: Secondary | ICD-10-CM

## 2021-03-20 ENCOUNTER — Ambulatory Visit: Payer: Self-pay | Admitting: Physician Assistant

## 2021-04-11 ENCOUNTER — Ambulatory Visit: Payer: 59 | Admitting: Family Medicine

## 2021-04-11 ENCOUNTER — Telehealth: Payer: Self-pay

## 2021-04-11 ENCOUNTER — Encounter: Payer: Self-pay | Admitting: Family Medicine

## 2021-04-11 ENCOUNTER — Other Ambulatory Visit: Payer: Self-pay

## 2021-04-11 VITALS — BP 145/88 | HR 74 | Temp 98.8°F | Resp 16 | Ht 65.0 in | Wt 271.0 lb

## 2021-04-11 DIAGNOSIS — E1169 Type 2 diabetes mellitus with other specified complication: Secondary | ICD-10-CM

## 2021-04-11 DIAGNOSIS — I10 Essential (primary) hypertension: Secondary | ICD-10-CM | POA: Diagnosis not present

## 2021-04-11 DIAGNOSIS — E1159 Type 2 diabetes mellitus with other circulatory complications: Secondary | ICD-10-CM

## 2021-04-11 DIAGNOSIS — Z6841 Body Mass Index (BMI) 40.0 and over, adult: Secondary | ICD-10-CM

## 2021-04-11 DIAGNOSIS — I152 Hypertension secondary to endocrine disorders: Secondary | ICD-10-CM

## 2021-04-11 DIAGNOSIS — E785 Hyperlipidemia, unspecified: Secondary | ICD-10-CM

## 2021-04-11 MED ORDER — AMLODIPINE BESYLATE 10 MG PO TABS: 10.0000 mg | ORAL_TABLET | Freq: Every day | ORAL | 1 refills | Status: DC

## 2021-04-11 NOTE — Telephone Encounter (Signed)
Copied from [place] [id number]. Topic: General - Other >> Apr 11, 2021  9:35 AM Tessa Lerner A wrote: Reason for CRM: Patient has returned missed call from practice  Agent saw no open notes or encounters at the time of call  Please contact again when possible

## 2021-04-11 NOTE — Patient Instructions (Signed)
Increase amlodipine from 5 to 10 mg daily.   DASH Eating Plan DASH stands for Dietary Approaches to Stop Hypertension. The DASH eating plan is a healthy eating plan that has been shown to: Reduce high blood pressure (hypertension). Reduce your risk for type 2 diabetes, heart disease, and stroke. Help with weight loss. What are tips for following this plan? Reading food labels Check food labels for the amount of salt (sodium) per serving. Choose foods with less than 5 percent of the Daily Value of sodium. Generally, foods with less than 300 milligrams (mg) of sodium per serving fit into this eating plan. To find whole grains, look for the word "whole" as the first word in the ingredient list. Shopping Buy products labeled as "low-sodium" or "no salt added." Buy fresh foods. Avoid canned foods and pre-made or frozen meals. Cooking Avoid adding salt when cooking. Use salt-free seasonings or herbs instead of table salt or sea salt. Check with your health care provider or pharmacist before using salt substitutes. Do not fry foods. Cook foods using healthy methods such as baking, boiling, grilling, roasting, and broiling instead. Cook with heart-healthy oils, such as olive, canola, avocado, soybean, or sunflower oil. Meal planning  Eat a balanced diet that includes: 4 or more servings of fruits and 4 or more servings of vegetables each day. Try to fill one-half of your plate with fruits and vegetables. 6-8 servings of whole grains each day. Less than 6 oz (170 g) of lean meat, poultry, or fish each day. A 3-oz (85-g) serving of meat is about the same size as a deck of cards. One egg equals 1 oz (28 g). 2-3 servings of low-fat dairy each day. One serving is 1 cup (237 mL). 1 serving of nuts, seeds, or beans 5 times each week. 2-3 servings of heart-healthy fats. Healthy fats called omega-3 fatty acids are found in foods such as walnuts, flaxseeds, fortified milks, and eggs. These fats are also  found in cold-water fish, such as sardines, salmon, and mackerel. Limit how much you eat of: Canned or prepackaged foods. Food that is high in trans fat, such as some fried foods. Food that is high in saturated fat, such as fatty meat. Desserts and other sweets, sugary drinks, and other foods with added sugar. Full-fat dairy products. Do not salt foods before eating. Do not eat more than 4 egg yolks a week. Try to eat at least 2 vegetarian meals a week. Eat more home-cooked food and less restaurant, buffet, and fast food.  Lifestyle When eating at a restaurant, ask that your food be prepared with less salt or no salt, if possible. If you drink alcohol: Limit how much you use to: 0-1 drink a day for women who are not pregnant. 0-2 drinks a day for men. Be aware of how much alcohol is in your drink. In the [place] one drink equals one 12 oz bottle of beer (355 mL), one 5 oz glass of wine (148 mL), or one 1 oz glass of hard liquor (44 mL). General information Avoid eating more than 2,300 mg of salt a day. If you have hypertension, you may need to reduce your sodium intake to 1,500 mg a day. Work with your health care provider to maintain a healthy body weight or to lose weight. Ask what an ideal weight is for you. Get at least 30 minutes of exercise that causes your heart to beat faster (aerobic exercise) most days of the week. Activities may include walking, swimming,  or biking. Work with your health care provider or dietitian to adjust your eating plan to your individual calorie needs. What foods should I eat? Fruits All fresh, dried, or frozen fruit. Canned fruit in natural juice (without addedsugar). Vegetables Fresh or frozen vegetables (raw, steamed, roasted, or grilled). Low-sodium or reduced-sodium tomato and vegetable juice. Low-sodium or reduced-sodium tomatosauce and tomato paste. Low-sodium or reduced-sodium canned vegetables. Grains Whole-grain or whole-wheat bread. Whole-grain  or whole-wheat pasta. Brown rice. [place]. Bulgur. Whole-grain and low-sodium cereals. Pita bread.Low-fat, low-sodium crackers. Whole-wheat flour tortillas. Meats and other proteins Skinless chicken or [place]. Ground chicken or [place]. Pork with fat trimmed off. Fish and seafood. Egg whites. Dried beans, peas, or lentils. Unsalted nuts, nut butters, and seeds. Unsalted canned beans. Lean cuts of beef with fat trimmed off. Low-sodium, lean precooked or cured meat, such as sausages or meatloaves. Dairy Low-fat (1%) or fat-free (skim) milk. Reduced-fat, low-fat, or fat-free cheeses. Nonfat, low-sodium ricotta or cottage cheese. Low-fat or nonfatyogurt. Low-fat, low-sodium cheese. Fats and oils Soft margarine without trans fats. Vegetable oil. Reduced-fat, low-fat, or light mayonnaise and salad dressings (reduced-sodium). Canola, safflower, olive, avocado, soybean, andsunflower oils. Avocado. Seasonings and condiments Herbs. Spices. Seasoning mixes without salt. Other foods Unsalted popcorn and pretzels. Fat-free sweets. The items listed above may not be a complete list of foods and beverages you can eat. Contact a dietitian for more information. What foods should I avoid? Fruits Canned fruit in a light or heavy syrup. Fried fruit. Fruit in cream or buttersauce. Vegetables Creamed or fried vegetables. Vegetables in a cheese sauce. Regular canned vegetables (not low-sodium or reduced-sodium). Regular canned tomato sauce and paste (not low-sodium or reduced-sodium). Regular tomato and vegetable juice(not low-sodium or reduced-sodium). Angie Fava. Olives. Grains Baked goods made with fat, such as croissants, muffins, or some breads. Drypasta or rice meal packs. Meats and other proteins Fatty cuts of meat. Ribs. Fried meat. Berniece Salines. Bologna, salami, and other precooked or cured meats, such as sausages or meat loaves. Fat from the back of a pig (fatback). Bratwurst. Salted nuts and seeds. Canned beans  with added salt. Canned orsmoked fish. Whole eggs or egg yolks. Chicken or [place] with skin. Dairy Whole or 2% milk, cream, and half-and-half. Whole or full-fat cream cheese. Whole-fat or sweetened yogurt. Full-fat cheese. Nondairy creamers. Whippedtoppings. Processed cheese and cheese spreads. Fats and oils Butter. Stick margarine. Lard. Shortening. Ghee. Bacon fat. Tropical oils, suchas coconut, palm kernel, or palm oil. Seasonings and condiments Onion salt, garlic salt, seasoned salt, table salt, and sea salt. Worcestershire sauce. Tartar sauce. Barbecue sauce. Teriyaki sauce. Soy sauce, including reduced-sodium. Steak sauce. Canned and packaged gravies. Fish sauce. Oyster sauce. Cocktail sauce. Store-bought horseradish. Ketchup. Mustard. Meat flavorings and tenderizers. Bouillon cubes. Hot sauces. Pre-made or packaged marinades. Pre-made or packaged taco seasonings. Relishes. Regular saladdressings. Other foods Salted popcorn and pretzels. The items listed above may not be a complete list of foods and beverages you should avoid. Contact a dietitian for more information. Where to find more information National Heart, Lung, and Blood Institute: https://wilson-eaton.com/ American Heart Association: www.heart.org Academy of Nutrition and Dietetics: www.eatright.[place]: www.kidney.org Summary The DASH eating plan is a healthy eating plan that has been shown to reduce high blood pressure (hypertension). It may also reduce your risk for type 2 diabetes, heart disease, and stroke. When on the DASH eating plan, aim to eat more fresh fruits and vegetables, whole grains, lean proteins, low-fat dairy, and heart-healthy fats. With the DASH eating plan,  you should limit salt (sodium) intake to 2,300 mg a day. If you have hypertension, you may need to reduce your sodium intake to 1,500 mg a day. Work with your health care provider or dietitian to adjust your eating plan to your individual  calorie needs. This information is not intended to replace advice given to you by your health care provider. Make sure you discuss any questions you have with your healthcare provider. Document Revised: 09/02/2019 Document Reviewed: 09/02/2019 Elsevier Patient Education  2022 [employer].

## 2021-04-11 NOTE — Assessment & Plan Note (Signed)
Discussed importance of healthy weight management Discussed diet and exercise  

## 2021-04-11 NOTE — Progress Notes (Signed)
Established patient visit   Patient: Jodi Becker   DOB: 29-Sep-1956   65 y.o. Female  MRN: [id number] Visit Date: 04/11/2021  Today's healthcare provider: Lavon Paganini, MD   Chief Complaint  Patient presents with   Diabetes   Hypertension   Hyperlipidemia   Subjective    Diabetes Pertinent negatives for hypoglycemia include no dizziness or headaches. Pertinent negatives for diabetes include no chest pain, no fatigue and no weakness.  Hypertension Pertinent negatives include no chest pain, headaches, neck pain, palpitations or shortness of breath.  Hyperlipidemia Pertinent negatives include no chest pain or shortness of breath.    Diabetes Mellitus Type II, follow-up  Lab Results  Component Value Date   HGBA1C 9.0 (A) 01/03/2021   HGBA1C 8.4 (H) 09/17/2020   HGBA1C 6.9 (A) 05/17/2020   Last seen for diabetes 3 months ago.  Management since then includes; Continued glipizide XL 10mg , metformin 500 mg. Added 5.46 mg Trulicity.  She reports good compliance with treatment.  She is not having side effects.   Home blood sugar records:  not being checked  Episodes of hypoglycemia? No    Current insulin regiment: none Most Recent Eye Exam: due  --------------------------------------------------------------------------------------------------- Hypertension, follow-up  BP Readings from Last 3 Encounters:  04/11/21 (!) 145/88  01/03/21 139/77  09/17/20 131/71   Wt Readings from Last 3 Encounters:  04/11/21 271 lb (122.9 kg)  01/03/21 273 lb (123.8 kg)  09/17/20 270 lb (122.5 kg)     She was last seen for hypertension 3 months ago.  BP at that visit was 139/77. Management since her last visit includes; 50 mg HCTZ, 100 mg losartan and 5 mg amlodipine. She reports good compliance with treatment. She is not having side effects.  She is not exercising. She is adherent to low salt diet.   Outside blood pressures are not being checked.  She does not  smoke.  Use of agents associated with hypertension: none.   --------------------------------------------------------------------------------------------------- Lipid/Cholesterol, follow-up  Last Lipid Panel: Lab Results  Component Value Date   CHOL 138 09/17/2020   LDLCALC 53 09/17/2020   HDL 66 09/17/2020   TRIG 107 09/17/2020   She was last seen for this 6 months ago.  Management since that visit includes; on pravastatin.  She reports good compliance with treatment. She is not having side effects.   Vaccine She received 3 COVID vaccines and is eligible for the 4th booster.   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 However she reports the swelling subsides at night.   Last metabolic panel Lab Results  Component Value Date   GLUCOSE 192 (H) 09/17/2020   NA 141 09/17/2020   K 4.1 09/17/2020   BUN 14 09/17/2020   CREATININE 0.81 09/17/2020   GFRNONAA 77 09/17/2020   GFRAA 89 09/17/2020   CALCIUM 11.2 (H) 09/17/2020   AST 24 09/17/2020   ALT 40 (H) 09/17/2020   The 10-year ASCVD risk score Mikey Bussing DC Jr., et al., 2013) is: 18%     Medications: Outpatient Medications Prior to Visit  Medication Sig   aspirin 81 MG chewable tablet Chew by mouth.   Cholecalciferol (VITAMIN D3) 1000 UNITS CAPS Take by mouth.   Dulaglutide (TRULICITY) 2.70 JJ/0.0XF SOPN Inject 0.75 mg into the skin once a week.   fluticasone (FLONASE) 50 MCG/ACT nasal spray Place 2 sprays into both nostrils daily.   glipiZIDE (GLUCOTROL XL) 10  MG 24 hr tablet Take 1 tablet by mouth once daily with breakfast   hydrochlorothiazide (HYDRODIURIL) 50 MG tablet Take 1 tablet (50 mg total) by mouth daily.   loratadine (CLARITIN) 10 MG tablet Take 1 tablet (10 mg total) by mouth daily.   losartan (COZAAR) 100 MG tablet Take 1 tablet by mouth once daily   metFORMIN (GLUCOPHAGE) 500 MG tablet Take 2 tablets by mouth twice daily   pravastatin (PRAVACHOL) 40  MG tablet Take 1 tablet by mouth once daily   [DISCONTINUED] amLODipine (NORVASC) 5 MG tablet Take 1 tablet (5 mg total) by mouth daily.   No facility-administered medications prior to visit.   Review of Systems  Constitutional:  Negative for appetite change, chills, fatigue and fever.  HENT:  Negative for ear pain, sinus pressure, sinus pain and sore throat.   Eyes:  Negative for pain and visual disturbance.  Respiratory:  Negative for cough, chest tightness and shortness of breath.   Cardiovascular:  Negative for chest pain, palpitations and leg swelling.  Gastrointestinal:  Negative for abdominal pain, constipation, diarrhea, nausea and vomiting.  Genitourinary:  Negative for flank pain, frequency, pelvic pain and urgency.  Musculoskeletal:  Negative for back pain and neck pain.  Neurological:  Negative for dizziness, weakness, light-headedness, numbness and headaches.      Objective    BP (!) 145/88   Pulse 74   Temp 98.8 F (37.1 C)   Resp 16   Ht 5\' 5"  (1.651 m)   Wt 271 lb (122.9 kg)   BMI 45.10 kg/m     Physical Exam Vitals reviewed.  Constitutional:      General: She is not in acute distress.    Appearance: Normal appearance. She is well-developed. She is not diaphoretic.  HENT:     Head: Normocephalic and atraumatic.  Eyes:     General: No scleral icterus.    Conjunctiva/sclera: Conjunctivae normal.  Neck:     Thyroid: No thyromegaly.  Cardiovascular:     Rate and Rhythm: Normal rate and regular rhythm.     Pulses: Normal pulses.     Heart sounds: Normal heart sounds. No murmur heard. Pulmonary:     Effort: Pulmonary effort is normal. No respiratory distress.     Breath sounds: Normal breath sounds. No wheezing, rhonchi or rales.  Musculoskeletal:     Cervical back: Neck supple.     Right lower leg: No edema.     Left lower leg: No edema.  Lymphadenopathy:     Cervical: No cervical adenopathy.  Skin:    General: Skin is warm and dry.     Findings: No  rash.  Neurological:     Mental Status: She is alert and oriented to person, place, and time. Mental status is at baseline.  Psychiatric:        Mood and Affect: Mood normal.        Behavior: Behavior normal.    No results found for any visits on 04/11/21.  Assessment & Plan     Problem List Items Addressed This Visit       Cardiovascular and Mediastinum   Hypertension associated with diabetes ([place])    Uncontrolled  Continue current meds, but increase amlodipine to 10mg  daily Recheck metabolic panel F/u in 3 months       Relevant Medications   amLODipine (NORVASC) 10 MG tablet   Other Relevant Orders   Lipid panel     Endocrine   Hyperlipidemia associated with type 2  diabetes mellitus ([place])    Previously well controlled Continue statin Repeat FLP and CMP Goal LDL < 70       Relevant Orders   Comprehensive metabolic panel   Lipid panel   T2DM (type 2 diabetes mellitus) ([place]) - Primary    Previously uncontrolled with hyperglycemia Assoc with HTN and HLD Repeat A1c Continue current meds but consider increase in Trulicity if V7C still elevated F/u in 3 m       Relevant Orders   Hemoglobin A1c     Other   Morbid obesity ([place])    Discussed importance of healthy weight management Discussed diet and exercise        Other Visit Diagnoses     Benign hypertension       Relevant Medications   amLODipine (NORVASC) 10 MG tablet   BMI 45.0-49.9, adult ([place])           Return in about 3 months (around 07/12/2021) for chronic disease f/u, call back in 1 month to schedule with new PCP.      I,Jodi Becker,acting as a Education administrator for Jodi Paganini, MD.,have documented all relevant documentation on the behalf of Jodi Paganini, MD,as directed by  Jodi Paganini, MD while in the presence of Jodi Paganini, MD.  I, Jodi Paganini, MD, have reviewed all documentation for this visit. The documentation on 04/11/21 for the exam, diagnosis, procedures, and  orders are all accurate and complete.   Jodi Becker, Jodi Bucy, MD, MPH [facility]

## 2021-04-11 NOTE — Assessment & Plan Note (Signed)
Previously uncontrolled with hyperglycemia Assoc with HTN and HLD Repeat A1c Continue current meds but consider increase in Trulicity if Y0F still elevated F/u in 3 m

## 2021-04-11 NOTE — Assessment & Plan Note (Signed)
Uncontrolled  Continue current meds, but increase amlodipine to 10mg  daily Recheck metabolic panel F/u in 3 months

## 2021-04-11 NOTE — Telephone Encounter (Signed)
Called to advise her that she left her covid card. CRM was created. Patient stopped by to pick this up.

## 2021-04-11 NOTE — Assessment & Plan Note (Signed)
Previously well controlled Continue statin Repeat FLP and CMP Goal LDL < 70 

## 2021-04-12 ENCOUNTER — Other Ambulatory Visit: Payer: Self-pay

## 2021-04-12 ENCOUNTER — Telehealth: Payer: Self-pay

## 2021-04-12 DIAGNOSIS — E1165 Type 2 diabetes mellitus with hyperglycemia: Secondary | ICD-10-CM

## 2021-04-12 LAB — COMPREHENSIVE METABOLIC PANEL
ALT: 45 IU/L — ABNORMAL HIGH (ref 0–32)
AST: 29 IU/L (ref 0–40)
Albumin/Globulin Ratio: 2 (ref 1.2–2.2)
Albumin: 4.6 g/dL (ref 3.8–4.8)
Alkaline Phosphatase: 101 IU/L (ref 44–121)
BUN/Creatinine Ratio: 21 (ref 12–28)
BUN: 16 mg/dL (ref 8–27)
Bilirubin Total: 0.4 mg/dL (ref 0.0–1.2)
CO2: 24 mmol/L (ref 20–29)
Calcium: 11.6 mg/dL — ABNORMAL HIGH (ref 8.7–10.3)
Chloride: 99 mmol/L (ref 96–106)
Creatinine, Ser: 0.77 mg/dL (ref 0.57–1.00)
Globulin, Total: 2.3 g/dL (ref 1.5–4.5)
Glucose: 145 mg/dL — ABNORMAL HIGH (ref 65–99)
Potassium: 4.5 mmol/L (ref 3.5–5.2)
Sodium: 138 mmol/L (ref 134–144)
Total Protein: 6.9 g/dL (ref 6.0–8.5)
eGFR: 86 mL/min/{1.73_m2} (ref >59)

## 2021-04-12 LAB — LIPID PANEL
Chol/HDL Ratio: 2.1 ratio (ref 0.0–4.4)
Cholesterol, Total: 127 mg/dL (ref 100–199)
HDL: 60 mg/dL (ref >39)
LDL Chol Calc (NIH): 47 mg/dL (ref 0–99)
Triglycerides: 108 mg/dL (ref 0–149)
VLDL Cholesterol Cal: 20 mg/dL (ref 5–40)

## 2021-04-12 LAB — HEMOGLOBIN A1C
Est. average glucose Bld gHb Est-mCnc: 194 mg/dL
Hgb A1c MFr Bld: 8.4 % — ABNORMAL HIGH (ref 4.8–5.6)

## 2021-04-12 MED ORDER — TRULICITY 1.5 MG/0.5ML ~~LOC~~ SOAJ: 1.5000 mg | SUBCUTANEOUS | 6 refills | Status: DC

## 2021-04-12 NOTE — Telephone Encounter (Signed)
Pt. Given lab results and instructions.Verbalizes understanding. 

## 2021-05-16 ENCOUNTER — Telehealth: Payer: Self-pay

## 2021-05-16 NOTE — Telephone Encounter (Signed)
Copied from [place] [phone]. Topic: General - Inquiry >> May 16, 2021  9:12 AM Greggory Keen D wrote: Suanne Marker with [facility] called asking for pt's last ov and labs to be faxed to [place]

## 2021-05-21 NOTE — Telephone Encounter (Signed)
[name] with [facility] calling again to have these OV notes , med list and labs faxed to her. Pt is in their CSNP for diabetes.  They cannot access her information until she has visit.  She is new to [facility] as of 05/13/2021.

## 2021-05-24 ENCOUNTER — Telehealth: Payer: Self-pay

## 2021-05-24 NOTE — Telephone Encounter (Signed)
Copied from [place] [id number]. Topic: General - Call Back - No Documentation >> May 24, 2021  1:43 PM Erick Blinks wrote: Reason for CRM: Pt returned call to the office, is requesting return call

## 2021-05-27 NOTE — Telephone Encounter (Addendum)
Pt calling stating she missed a call from the office.  No documentation why. Pt will be home all day. [phone]

## 2021-05-27 NOTE — Telephone Encounter (Signed)
Attempted to contact patient call was disconnected. KW

## 2021-06-01 ENCOUNTER — Other Ambulatory Visit: Payer: Self-pay | Admitting: Family Medicine

## 2021-06-01 ENCOUNTER — Other Ambulatory Visit: Payer: Self-pay | Admitting: Physician Assistant

## 2021-06-01 DIAGNOSIS — E7800 Pure hypercholesterolemia, unspecified: Secondary | ICD-10-CM

## 2021-06-01 DIAGNOSIS — I10 Essential (primary) hypertension: Secondary | ICD-10-CM

## 2021-06-04 ENCOUNTER — Telehealth: Payer: Self-pay | Admitting: Family Medicine

## 2021-06-04 ENCOUNTER — Telehealth: Payer: Self-pay

## 2021-06-04 ENCOUNTER — Other Ambulatory Visit: Payer: Self-pay

## 2021-06-04 DIAGNOSIS — Z1231 Encounter for screening mammogram for malignant neoplasm of breast: Secondary | ICD-10-CM

## 2021-06-04 DIAGNOSIS — E7800 Pure hypercholesterolemia, unspecified: Secondary | ICD-10-CM

## 2021-06-04 MED ORDER — PRAVASTATIN SODIUM 40 MG PO TABS: 40.0000 mg | ORAL_TABLET | Freq: Every day | ORAL | 0 refills | Status: DC

## 2021-06-04 NOTE — Telephone Encounter (Signed)
Copied from [place] [phone]. Topic: Referral - Request for Referral >> Jun 04, 2021  2:07 PM Scherrie Gerlach wrote: Has patient seen PCP for this complaint? No.  Referral for which specialty: Mammogram Preferred provider/office: [facility] Pt needs order for a mammogram at [facility]

## 2021-06-04 NOTE — Telephone Encounter (Addendum)
Rebecca pharmacist at [facility] [address] phone number [phone] is unable to requesting pravastatin 40 mg one tablet daily electronically due to jennifer burnett name on rx. Pt sees elisa

## 2021-06-04 NOTE — Telephone Encounter (Signed)
[facility] faxed refill request for the following medications:   pravastatin (PRAVACHOL) 40 MG tablet   Please advise.

## 2021-06-06 NOTE — Telephone Encounter (Signed)
Pt called and wants 90 day supply specifically for this medication (Pravastatin). She wants to know why if she is not allowed to have this, requesting a call back. She says she also needs a 90 day supply for Losartan.

## 2021-06-07 ENCOUNTER — Other Ambulatory Visit: Payer: Self-pay

## 2021-06-07 ENCOUNTER — Other Ambulatory Visit: Payer: Self-pay | Admitting: Family Medicine

## 2021-06-07 DIAGNOSIS — E7800 Pure hypercholesterolemia, unspecified: Secondary | ICD-10-CM

## 2021-06-07 DIAGNOSIS — I10 Essential (primary) hypertension: Secondary | ICD-10-CM

## 2021-06-07 DIAGNOSIS — E1165 Type 2 diabetes mellitus with hyperglycemia: Secondary | ICD-10-CM

## 2021-06-07 MED ORDER — LOSARTAN POTASSIUM 100 MG PO TABS: 100.0000 mg | ORAL_TABLET | Freq: Every day | ORAL | 0 refills | Status: DC

## 2021-06-07 MED ORDER — PRAVASTATIN SODIUM 40 MG PO TABS: 40.0000 mg | ORAL_TABLET | Freq: Every day | ORAL | 0 refills | Status: DC

## 2021-06-14 MED ORDER — AMLODIPINE BESYLATE 10 MG PO TABS: 10.0000 mg | ORAL_TABLET | Freq: Every day | ORAL | 3 refills | Status: DC

## 2021-06-14 MED ORDER — METFORMIN HCL 500 MG PO TABS: 1000.0000 mg | ORAL_TABLET | Freq: Two times a day (BID) | ORAL | 3 refills | Status: DC

## 2021-06-14 MED ORDER — LOSARTAN POTASSIUM 100 MG PO TABS: 100.0000 mg | ORAL_TABLET | Freq: Every day | ORAL | 3 refills | Status: DC

## 2021-06-14 MED ORDER — PRAVASTATIN SODIUM 40 MG PO TABS: 40.0000 mg | ORAL_TABLET | Freq: Every day | ORAL | 3 refills | Status: DC

## 2021-06-14 MED ORDER — HYDROCHLOROTHIAZIDE 50 MG PO TABS: 50.0000 mg | ORAL_TABLET | Freq: Every day | ORAL | 3 refills | Status: DC

## 2021-06-17 ENCOUNTER — Other Ambulatory Visit: Payer: Self-pay | Admitting: Family Medicine

## 2021-06-17 DIAGNOSIS — E1165 Type 2 diabetes mellitus with hyperglycemia: Secondary | ICD-10-CM

## 2021-06-27 ENCOUNTER — Other Ambulatory Visit: Payer: Self-pay

## 2021-06-27 ENCOUNTER — Ambulatory Visit
Admission: RE | Admit: 2021-06-27 | Discharge: 2021-06-27 | Disposition: A | Discharge status: Home / Self Care | Payer: HMO | Source: Ambulatory Visit | Attending: Family Medicine | Admitting: Family Medicine

## 2021-06-27 DIAGNOSIS — Z1231 Encounter for screening mammogram for malignant neoplasm of breast: Secondary | ICD-10-CM | POA: Insufficient documentation

## 2021-07-02 NOTE — Progress Notes (Signed)
Please call patient to advise normal screening from mammogram.  Thanks EP

## 2021-07-22 ENCOUNTER — Ambulatory Visit (INDEPENDENT_AMBULATORY_CARE_PROVIDER_SITE_OTHER): Payer: HMO | Admitting: Family Medicine

## 2021-07-22 ENCOUNTER — Telehealth: Payer: Self-pay

## 2021-07-22 ENCOUNTER — Encounter: Payer: Self-pay | Admitting: Family Medicine

## 2021-07-22 ENCOUNTER — Other Ambulatory Visit: Payer: Self-pay

## 2021-07-22 VITALS — BP 114/76 | HR 77 | Temp 98.5°F | Resp 16 | Wt 266.2 lb

## 2021-07-22 DIAGNOSIS — E1159 Type 2 diabetes mellitus with other circulatory complications: Secondary | ICD-10-CM | POA: Diagnosis not present

## 2021-07-22 DIAGNOSIS — I152 Hypertension secondary to endocrine disorders: Secondary | ICD-10-CM

## 2021-07-22 DIAGNOSIS — B369 Superficial mycosis, unspecified: Secondary | ICD-10-CM | POA: Diagnosis not present

## 2021-07-22 DIAGNOSIS — Z1211 Encounter for screening for malignant neoplasm of colon: Secondary | ICD-10-CM

## 2021-07-22 DIAGNOSIS — E1169 Type 2 diabetes mellitus with other specified complication: Secondary | ICD-10-CM | POA: Diagnosis not present

## 2021-07-22 DIAGNOSIS — E785 Hyperlipidemia, unspecified: Secondary | ICD-10-CM | POA: Diagnosis not present

## 2021-07-22 DIAGNOSIS — E1165 Type 2 diabetes mellitus with hyperglycemia: Secondary | ICD-10-CM | POA: Diagnosis not present

## 2021-07-22 LAB — POCT GLYCOSYLATED HEMOGLOBIN (HGB A1C): Hemoglobin A1C: 7 % — AB (ref 4.0–5.6)

## 2021-07-22 MED ORDER — ZINC OXIDE 20 % EX OINT: TOPICAL_OINTMENT | Freq: Two times a day (BID) | CUTANEOUS | 0 refills | Status: DC | PRN

## 2021-07-22 MED ORDER — BLOOD GLUCOSE METER KIT: PACK | 0 refills | Status: DC

## 2021-07-22 NOTE — Telephone Encounter (Signed)
Copied from [place] [phone]. Topic: General - Other >> Jul 22, 2021  9:50 AM Leward Quan A wrote: Reason for CRM: Christine called in to inform Ms Jodi Becker that the Rx sent over for blood glucose meter kit and supplies need to be more specific state how many times patient will test, Dx and any other relevant information. Please call pharmacy at Ph#  [phone]

## 2021-07-22 NOTE — Assessment & Plan Note (Addendum)
Chronic, stable BP at goal of <130/<80 Congratulated on efforts

## 2021-07-22 NOTE — Assessment & Plan Note (Signed)
Congratulated on weight loss Continue to recommend diet low in saturated fat and regular exercise - 30 min at least 5 times per week

## 2021-07-22 NOTE — Assessment & Plan Note (Signed)
a1c at goal Pt request for glucometer

## 2021-07-22 NOTE — Assessment & Plan Note (Signed)
Slight acute rash on R pannus- red, ashen

## 2021-07-22 NOTE — Progress Notes (Signed)
Established patient visit   Patient: Jodi Becker   DOB: June 02, 1956   65 y.o. Female  MRN: [id number] Visit Date: 07/22/2021  Today's healthcare provider: Gwyneth Sprout, FNP   Chief Complaint  Patient presents with   Hypertension   Diabetes   Hyperlipidemia    Subjective    HPI  Diabetes Mellitus Type II, follow-up  Lab Results  Component Value Date   HGBA1C 7.0 (A) 07/22/2021   HGBA1C 8.4 (H) 04/11/2021   HGBA1C 9.0 (A) 01/03/2021   Last seen for diabetes 4 months ago.  Management since then includes continuing the same treatment. She reports excellent compliance with treatment. She is not having side effects.   Home blood sugar records:  not being checked  Episodes of hypoglycemia? No    Current insulin regiment: none Most Recent Eye Exam: UTD  --------------------------------------------------------------------------------------------------- Hypertension, follow-up  BP Readings from Last 3 Encounters:  07/22/21 114/76  04/11/21 (!) 145/88  01/03/21 139/77   Wt Readings from Last 3 Encounters:  07/22/21 266 lb 3.2 oz (120.7 kg)  04/11/21 271 lb (122.9 kg)  01/03/21 273 lb (123.8 kg)     She was last seen for hypertension 4 months ago.  BP at that visit was 145/68. Management since that visit includes increase Amlodipine 10mg  daily. She reports excellent compliance with treatment. She is not having side effects.  She is not exercising. She is not adherent to low salt diet.   Outside blood pressures are not being checked.  She does smoke.  Use of agents associated with hypertension: NSAIDS.   --------------------------------------------------------------------------------------------------- Lipid/Cholesterol, follow-up  Last Lipid Panel: Lab Results  Component Value Date   CHOL 127 04/11/2021   LDLCALC 47 04/11/2021   HDL 60 04/11/2021   TRIG 108 04/11/2021    She was last seen for this 4 months ago.  Management since that  visit includes none.  She reports excellent compliance with treatment. She is not having side effects.   Symptoms: No appetite changes No foot ulcerations  No chest pain No chest pressure/discomfort  No dyspnea No orthopnea  Yes fatigue Yes lower extremity edema  No palpitations No paroxysmal nocturnal dyspnea  No nausea No numbness or tingling of extremity  No polydipsia No polyuria  No speech difficulty No syncope   She is following a Regular diet. Current exercise: none  Last metabolic panel Lab Results  Component Value Date   GLUCOSE 145 (H) 04/11/2021   NA 138 04/11/2021   K 4.5 04/11/2021   BUN 16 04/11/2021   CREATININE 0.77 04/11/2021   EGFR 86 04/11/2021   GFRNONAA 77 09/17/2020   CALCIUM 11.6 (H) 04/11/2021   AST 29 04/11/2021   ALT 45 (H) 04/11/2021   The ASCVD Risk score (Arnett DK, et al., 2019) failed to calculate for the following reasons:   The valid total cholesterol range is 130 to 320 mg/dL  ---------------------------------------------------------------------------------------------------      Medications: Outpatient Medications Prior to Visit  Medication Sig   amLODipine (NORVASC) 10 MG tablet Take 1 tablet (10 mg total) by mouth daily.   aspirin 81 MG chewable tablet Chew by mouth.   Cholecalciferol (VITAMIN D3) 1000 UNITS CAPS Take by mouth.   Dulaglutide (TRULICITY) 1.5 OP/9.2TW SOPN Inject 1.5 mg into the skin once a week.   fluticasone (FLONASE) 50 MCG/ACT nasal spray Place 2 sprays into both nostrils daily.   glipiZIDE (GLUCOTROL XL) 10 MG 24 hr tablet Take 1 tablet by mouth once  daily with breakfast   hydrochlorothiazide (HYDRODIURIL) 50 MG tablet Take 1 tablet (50 mg total) by mouth daily.   loratadine (CLARITIN) 10 MG tablet Take 1 tablet (10 mg total) by mouth daily.   losartan (COZAAR) 100 MG tablet Take 1 tablet (100 mg total) by mouth daily.   metFORMIN (GLUCOPHAGE) 500 MG tablet Take 2 tablets (1,000 mg total) by mouth 2 (two)  times daily.   pravastatin (PRAVACHOL) 40 MG tablet Take 1 tablet (40 mg total) by mouth daily.   No facility-administered medications prior to visit.    Review of Systems     Objective    BP 114/76   Pulse 77   Temp 98.5 F (36.9 C) (Oral)   Resp 16   Wt 266 lb 3.2 oz (120.7 kg)   BMI 44.30 kg/m    Physical Exam Vitals and nursing note reviewed.  Constitutional:      General: She is not in acute distress.    Appearance: Normal appearance. She is obese. She is not ill-appearing, toxic-appearing or diaphoretic.  HENT:     Head: Normocephalic and atraumatic.  Cardiovascular:     Rate and Rhythm: Normal rate and regular rhythm.     Pulses: Normal pulses.     Heart sounds: Normal heart sounds. No murmur heard.   No friction rub. No gallop.  Pulmonary:     Effort: Pulmonary effort is normal. No respiratory distress.     Breath sounds: Normal breath sounds. No stridor. No wheezing, rhonchi or rales.  Chest:     Chest wall: No tenderness.  Abdominal:     General: Bowel sounds are normal.     Palpations: Abdomen is soft.  Musculoskeletal:        General: No swelling, tenderness, deformity or signs of injury. Normal range of motion.     Right lower leg: No edema.     Left lower leg: No edema.     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     Capillary Refill: Capillary refill takes less than 2 seconds.     Coloration: Skin is not jaundiced or pale.     Findings: Rash present. No bruising, erythema or lesion.          Comments: Rash under R skin abdominal skin fold  Neurological:     General: No focal deficit present.     Mental Status: She is alert and oriented to person, place, and time. Mental status is at baseline.     Cranial Nerves: No cranial nerve deficit.     Sensory: No sensory deficit.     Motor: No weakness.      Coordination: Coordination normal.  Psychiatric:        Mood and Affect: Mood normal.        Behavior: Behavior normal.        Thought Content: Thought content normal.        Judgment: Judgment normal.      Results for orders placed or performed in visit on 07/22/21  POCT glycosylated hemoglobin (Hb A1C)  Result Value Ref Range   Hemoglobin A1C 7.0 (A) 4.0 - 5.6 %   HbA1c POC (<> result, manual entry)     HbA1c, POC (prediabetic range)     HbA1c, POC (controlled diabetic range)      Assessment & Plan  Problem List Items Addressed This Visit       Endocrine   T2DM (type 2 diabetes mellitus) ([place]) - Primary   Relevant Medications   blood glucose meter kit and supplies   Other Relevant Orders   POCT glycosylated hemoglobin (Hb A1C) (Completed)     Musculoskeletal and Integument   Fungal rash of torso   Relevant Medications   hydrocortisone 2.5%-nystatin-zinc oxide 20% 1:1:1 ointment mixture     Other   Colon cancer screening   Relevant Orders   Ambulatory referral to Gastroenterology     Return in about 3 months (around 10/22/2021) for T2DM management.      Vonna Kotyk, FNP, have reviewed all documentation for this visit. The documentation on 07/22/21 for the exam, diagnosis, procedures, and orders are all accurate and complete.    Gwyneth Sprout, [place] [phone] (phone) [phone] (fax)  [place]

## 2021-07-22 NOTE — Assessment & Plan Note (Signed)
Pt request to establish colonoscopy

## 2021-07-23 NOTE — Telephone Encounter (Signed)
Prescription was revised 07/22/21

## 2021-07-23 NOTE — Telephone Encounter (Signed)
Please revise. KW

## 2021-07-24 ENCOUNTER — Telehealth: Payer: Self-pay

## 2021-07-24 NOTE — Telephone Encounter (Signed)
Pt. Returning call to schedule colonoscopy

## 2021-07-24 NOTE — Telephone Encounter (Signed)
CALLED PATIENT NO ANSWER

## 2021-07-25 NOTE — Telephone Encounter (Signed)
Jodi Becker is calling from [facility] calling for clairifcaiton and wanting to know the quanity of the strips and the lancets. Please advise CB- [phone]

## 2021-07-30 ENCOUNTER — Telehealth: Payer: Self-pay

## 2021-07-30 NOTE — Telephone Encounter (Signed)
Patient is ready to get Colonoscopy scheduled. Returning your phone call

## 2021-07-31 ENCOUNTER — Other Ambulatory Visit: Payer: Self-pay

## 2021-07-31 DIAGNOSIS — Z86010 Personal history of colonic polyps: Secondary | ICD-10-CM

## 2021-07-31 MED ORDER — PEG 3350-KCL-NA BICARB-NACL 420 G PO SOLR: 4000.0000 mL | Freq: Once | ORAL | 0 refills | Status: AC

## 2021-07-31 NOTE — Progress Notes (Signed)
Gastroenterology Pre-Procedure Review  Request Date: 08/06/21 Requesting Physician: Dr. Vicente Males  PATIENT REVIEW QUESTIONS: The patient responded to the following health history questions as indicated:    1. Are you having any GI issues? no 2. Do you have a personal history of Polyps? yes (07/04/2010) 3. Do you have a family history of Colon Cancer or Polyps? no 4. Diabetes Mellitus? yes (Type II) 5. Joint replacements in the past 12 months?no 6. Major health problems in the past 3 months?no 7. Any artificial heart valves, MVP, or defibrillator?no    MEDICATIONS & ALLERGIES:    Patient reports the following regarding taking any anticoagulation/antiplatelet therapy:   Plavix, Coumadin, Eliquis, Xarelto, Lovenox, Pradaxa, Brilinta, or Effient? no Aspirin? yes (81 mg)  Patient confirms/reports the following medications:  Current Outpatient Medications  Medication Sig Dispense Refill   amLODipine (NORVASC) 10 MG tablet Take 1 tablet (10 mg total) by mouth daily. 90 tablet 3   aspirin 81 MG chewable tablet Chew by mouth.     blood glucose meter kit and supplies Dispense based on patient and insurance preference. Use up to four times daily as directed. (FOR ICD-10 E10.9, E11.9). 1 each 0   Cholecalciferol (VITAMIN D3) 1000 UNITS CAPS Take by mouth.     Dulaglutide (TRULICITY) 1.5 SL/3.7DS SOPN Inject 1.5 mg into the skin once a week. 0.5 mL 6   fluticasone (FLONASE) 50 MCG/ACT nasal spray Place 2 sprays into both nostrils daily. 16 g 6   glipiZIDE (GLUCOTROL XL) 10 MG 24 hr tablet Take 1 tablet by mouth once daily with breakfast 90 tablet 0   hydrochlorothiazide (HYDRODIURIL) 50 MG tablet Take 1 tablet (50 mg total) by mouth daily. 90 tablet 3   hydrocortisone 2.5%-nystatin-zinc oxide 20% 1:1:1 ointment mixture Apply topically 2 (two) times daily as needed. Apply to groin folds 240 g 0   loratadine (CLARITIN) 10 MG tablet Take 1 tablet (10 mg total) by mouth daily. 90 tablet 1   losartan  (COZAAR) 100 MG tablet Take 1 tablet (100 mg total) by mouth daily. 90 tablet 3   metFORMIN (GLUCOPHAGE) 500 MG tablet Take 2 tablets (1,000 mg total) by mouth 2 (two) times daily. 360 tablet 3   pravastatin (PRAVACHOL) 40 MG tablet Take 1 tablet (40 mg total) by mouth daily. 90 tablet 3   No current facility-administered medications for this visit.    Patient confirms/reports the following allergies:  Allergies  Allergen Reactions   Sulfa Antibiotics     No orders of the defined types were placed in this encounter.   AUTHORIZATION INFORMATION Primary Insurance: 1D#: Group #:  Secondary Insurance: 1D#: Group #:  SCHEDULE INFORMATION: Date: 08/06/21 Time: Location: [facility]

## 2021-07-31 NOTE — Telephone Encounter (Signed)
Procedure scheduled for 08/06/21.

## 2021-08-06 ENCOUNTER — Encounter
Admission: RE | Disposition: A | Discharge status: Home / Self Care | Payer: Self-pay | Source: Home / Self Care | Attending: Gastroenterology

## 2021-08-06 ENCOUNTER — Encounter: Payer: Self-pay | Admitting: Gastroenterology

## 2021-08-06 ENCOUNTER — Ambulatory Visit
Admission: RE | Admit: 2021-08-06 | Discharge: 2021-08-06 | Disposition: A | Discharge status: Home / Self Care | Payer: HMO | Attending: Gastroenterology | Admitting: Gastroenterology

## 2021-08-06 ENCOUNTER — Other Ambulatory Visit: Payer: Self-pay

## 2021-08-06 ENCOUNTER — Ambulatory Visit: Payer: HMO | Admitting: Anesthesiology

## 2021-08-06 DIAGNOSIS — D128 Benign neoplasm of rectum: Secondary | ICD-10-CM | POA: Diagnosis not present

## 2021-08-06 DIAGNOSIS — Z1211 Encounter for screening for malignant neoplasm of colon: Secondary | ICD-10-CM | POA: Diagnosis not present

## 2021-08-06 DIAGNOSIS — E119 Type 2 diabetes mellitus without complications: Secondary | ICD-10-CM | POA: Diagnosis not present

## 2021-08-06 DIAGNOSIS — Z7984 Long term (current) use of oral hypoglycemic drugs: Secondary | ICD-10-CM | POA: Insufficient documentation

## 2021-08-06 DIAGNOSIS — Z85038 Personal history of other malignant neoplasm of large intestine: Secondary | ICD-10-CM | POA: Diagnosis not present

## 2021-08-06 DIAGNOSIS — Z7985 Long-term (current) use of injectable non-insulin antidiabetic drugs: Secondary | ICD-10-CM | POA: Diagnosis not present

## 2021-08-06 DIAGNOSIS — Z79899 Other long term (current) drug therapy: Secondary | ICD-10-CM | POA: Insufficient documentation

## 2021-08-06 DIAGNOSIS — Z882 Allergy status to sulfonamides status: Secondary | ICD-10-CM | POA: Insufficient documentation

## 2021-08-06 DIAGNOSIS — Z860100 Personal history of colon polyps, unspecified: Secondary | ICD-10-CM

## 2021-08-06 DIAGNOSIS — Z980 Intestinal bypass and anastomosis status: Secondary | ICD-10-CM | POA: Diagnosis not present

## 2021-08-06 DIAGNOSIS — D126 Benign neoplasm of colon, unspecified: Secondary | ICD-10-CM

## 2021-08-06 DIAGNOSIS — Z7982 Long term (current) use of aspirin: Secondary | ICD-10-CM | POA: Insufficient documentation

## 2021-08-06 DIAGNOSIS — D125 Benign neoplasm of sigmoid colon: Secondary | ICD-10-CM | POA: Diagnosis not present

## 2021-08-06 DIAGNOSIS — K635 Polyp of colon: Secondary | ICD-10-CM | POA: Diagnosis not present

## 2021-08-06 DIAGNOSIS — Z6841 Body Mass Index (BMI) 40.0 and over, adult: Secondary | ICD-10-CM | POA: Diagnosis not present

## 2021-08-06 HISTORY — DX: Atherosclerotic heart disease of native coronary artery without angina pectoris: I25.10

## 2021-08-06 HISTORY — DX: Type 2 diabetes mellitus without complications: E11.9

## 2021-08-06 HISTORY — PX: COLONOSCOPY WITH PROPOFOL: SHX5780

## 2021-08-06 HISTORY — DX: Essential (primary) hypertension: I10

## 2021-08-06 LAB — GLUCOSE, CAPILLARY: Glucose-Capillary: 137 mg/dL — ABNORMAL HIGH (ref 70–99)

## 2021-08-06 SURGERY — COLONOSCOPY WITH PROPOFOL
Anesthesia: General

## 2021-08-06 MED ORDER — LIDOCAINE HCL (CARDIAC) PF 100 MG/5ML IV SOSY
PREFILLED_SYRINGE | INTRAVENOUS | Status: DC | PRN
  Administered 2021-08-06: 60 mg via INTRAVENOUS

## 2021-08-06 MED ORDER — DEXMEDETOMIDINE (PRECEDEX) IN NS 20 MCG/5ML (4 MCG/ML) IV SYRINGE
PREFILLED_SYRINGE | INTRAVENOUS | Status: DC | PRN
  Administered 2021-08-06: 4 ug via INTRAVENOUS

## 2021-08-06 MED ORDER — SODIUM CHLORIDE 0.9 % IV SOLN: INTRAVENOUS | Status: DC

## 2021-08-06 MED ORDER — PROPOFOL 10 MG/ML IV BOLUS
INTRAVENOUS | Status: DC | PRN
  Administered 2021-08-06: 70 mg via INTRAVENOUS

## 2021-08-06 MED ORDER — PROPOFOL 500 MG/50ML IV EMUL
INTRAVENOUS | Status: DC | PRN
  Administered 2021-08-06: 140 ug/kg/min via INTRAVENOUS
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
## 2021-08-06 NOTE — Anesthesia Preprocedure Evaluation (Addendum)
Anesthesia Evaluation  Patient identified by MRN, date of birth, ID band Patient awake    Reviewed: Allergy & Precautions, NPO status , Patient's Chart, lab work & pertinent test results  History of Anesthesia Complications Negative for: history of anesthetic complications  Airway Mallampati: IV   Neck ROM: Full    Dental no notable dental hx.    Pulmonary neg pulmonary ROS,    Pulmonary exam normal breath sounds clear to auscultation       Cardiovascular hypertension, Normal cardiovascular exam Rhythm:Regular Rate:Normal     Neuro/Psych negative neurological ROS     GI/Hepatic GERD  ,  Endo/Other  diabetes, Type 2Class 3 obesity  Renal/GU negative Renal ROS     Musculoskeletal   Abdominal   Peds  Hematology Colon cancer, uterine cancer, lymphoma   Anesthesia Other Findings   Reproductive/Obstetrics                            Anesthesia Physical Anesthesia Plan  ASA: 3  Anesthesia Plan: General   Post-op Pain Management:    Induction: Intravenous  PONV Risk Score and Plan: 3 and Propofol infusion, TIVA and Treatment may vary due to age or medical condition  Airway Management Planned: Natural Airway  Additional Equipment:   Intra-op Plan:   Post-operative Plan:   Informed Consent: I have reviewed the patients History and Physical, chart, labs and discussed the procedure including the risks, benefits and alternatives for the proposed anesthesia with the patient or authorized representative who has indicated his/her understanding and acceptance.       Plan Discussed with: CRNA  Anesthesia Plan Comments:        Anesthesia Quick Evaluation

## 2021-08-06 NOTE — Anesthesia Postprocedure Evaluation (Signed)
Anesthesia Post Note  Patient: Jodi Becker  Procedure(s) Performed: COLONOSCOPY WITH PROPOFOL  Patient location during evaluation: PACU Anesthesia Type: General Level of consciousness: awake and alert, oriented and patient cooperative Pain management: pain level controlled Vital Signs Assessment: post-procedure vital signs reviewed and stable Respiratory status: spontaneous breathing, nonlabored ventilation and respiratory function stable Cardiovascular status: blood pressure returned to baseline and stable Postop Assessment: adequate PO intake Anesthetic complications: no   No notable events documented.   Last Vitals:  Vitals:   08/06/21 1222 08/06/21 1232  BP: 104/75   Pulse: 68   Resp: 16 17  Temp:    SpO2: 97%     Last Pain:  Vitals:   08/06/21 1232  TempSrc:   PainSc: 0-No pain                 Darrin Nipper

## 2021-08-06 NOTE — Transfer of Care (Signed)
Immediate Anesthesia Transfer of Care Note  Patient: Jodi Becker  Procedure(s) Performed: COLONOSCOPY WITH PROPOFOL  Patient Location: PACU  Anesthesia Type:General  Level of Consciousness: awake, alert  and oriented  Airway & Oxygen Therapy: Patient Spontanous Breathing  Post-op Assessment: VSS  Post vital signs: Reviewed and stable  Last Vitals:  Vitals Value Taken Time  BP    Temp    Pulse    Resp    SpO2      Last Pain:  Vitals:   08/06/21 1025  TempSrc: Temporal  PainSc: 0-No pain         Complications: No notable events documented.

## 2021-08-06 NOTE — Op Note (Signed)
[facility] Gastroenterology Patient Name: [place] Procedure Date: 08/06/2021 11:38 AM MRN: [id number] Account #: [id number] Date of Birth: March 22, 1956 Admit Type: Outpatient Age: 65 Room: [facility] ENDO ROOM 2 Gender: Female Note Status: Finalized Instrument Name: Park Meo [id number] Procedure:             Colonoscopy Indications:           Surveillance: Personal history of colonic polyps                         (unknown histology) on last colonoscopy more than 5                         years ago, Last colonoscopy: September 2011 Providers:             Jonathon Bellows MD, MD Medicines:             Monitored Anesthesia Care Complications:         No immediate complications. Procedure:             Pre-Anesthesia Assessment:                        - Prior to the procedure, a History and Physical was                         performed, and patient medications, allergies and                         sensitivities were reviewed. The patient's tolerance                         of previous anesthesia was reviewed.                        - The risks and benefits of the procedure and the                         sedation options and risks were discussed with the                         patient. All questions were answered and informed                         consent was obtained.                        - ASA Grade Assessment: III - A patient with severe                         systemic disease.                        After obtaining informed consent, the colonoscope was                         passed under direct vision. Throughout the procedure,                         the patient's blood pressure, pulse, and oxygen  saturations were monitored continuously. The                         Colonoscope was introduced through the anus and                         advanced to the the ileocolonic anastomosis. The                         colonoscopy was performed  with ease. The patient                         tolerated the procedure well. The quality of the bowel                         preparation was adequate. Findings:      The perianal and digital rectal examinations were normal.      There was evidence of a prior end-to-side ileo-colonic anastomosis in       the transverse colon. This was patent and was characterized by healthy       appearing mucosa.      A 30 mm polyp was found in the rectum. The polyp was pedunculated. The       polyp was removed with a hot snare. Resection and retrieval were       complete.      No additional abnormalities were found on retroflexion.      A 5 mm polyp was found in the sigmoid colon. The polyp was sessile. The       polyp was removed with a cold snare. Resection and retrieval were       complete. Impression:            - Patent end-to-side ileo-colonic anastomosis,                         characterized by healthy appearing mucosa.                        - One 30 mm polyp in the rectum, removed with a hot                         snare. Resected and retrieved. Recommendation:        - Discharge patient to home (with escort).                        - Resume previous diet.                        - Continue present medications.                        - Await pathology results.                        - Repeat colonoscopy for surveillance based on                         pathology results. Procedure Code(s):     --- Professional ---  45385, Colonoscopy, flexible; with removal of                         tumor(s), polyp(s), or other lesion(s) by snare                         technique Diagnosis Code(s):     --- Professional ---                        Z86.010, Personal history of colonic polyps                        Z98.0, Intestinal bypass and anastomosis status                        K62.1, Rectal polyp CPT copyright 2019 American Medical Association. All rights reserved. The codes  documented in this report are preliminary and upon coder review may  be revised to meet current compliance requirements. Jonathon Bellows, MD Jonathon Bellows MD, MD 08/06/2021 12:13:03 PM This report has been signed electronically. Number of Addenda: 0 Note Initiated On: 08/06/2021 11:38 AM Scope Withdrawal Time: 0 hours 13 minutes 59 seconds  Total Procedure Duration: 0 hours 19 minutes 41 seconds  Estimated Blood Loss:  Estimated blood loss: none.      [facility]

## 2021-08-07 ENCOUNTER — Encounter: Payer: Self-pay | Admitting: Gastroenterology

## 2021-08-07 LAB — SURGICAL PATHOLOGY

## 2021-08-13 ENCOUNTER — Encounter: Payer: Self-pay | Admitting: Gastroenterology

## 2021-08-20 ENCOUNTER — Telehealth: Payer: Self-pay

## 2021-08-20 NOTE — Telephone Encounter (Signed)
Copied from [place] [id number]. 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 Please call patient at Ph# [phone]

## 2021-08-20 NOTE — Telephone Encounter (Signed)
Patient was last seen in office on 07/22/21 do you want her to return for note she is requesting or can you write note? KW

## 2021-08-21 ENCOUNTER — Ambulatory Visit (INDEPENDENT_AMBULATORY_CARE_PROVIDER_SITE_OTHER): Payer: HMO | Admitting: Family Medicine

## 2021-08-21 ENCOUNTER — Other Ambulatory Visit: Payer: Self-pay

## 2021-08-21 ENCOUNTER — Encounter: Payer: Self-pay | Admitting: Family Medicine

## 2021-08-21 VITALS — BP 135/67 | HR 79 | Temp 98.5°F | Wt 267.0 lb

## 2021-08-21 DIAGNOSIS — M79605 Pain in left leg: Secondary | ICD-10-CM | POA: Diagnosis not present

## 2021-08-21 DIAGNOSIS — M79604 Pain in right leg: Secondary | ICD-10-CM

## 2021-08-21 NOTE — Progress Notes (Signed)
Established patient visit   Patient: Jodi Becker   DOB: Mar 23, 1956   65 y.o. Female  MRN: [id number] Visit Date: 08/21/2021  Today's healthcare provider: Lelon Huh, MD   Chief Complaint  Patient presents with   Leg Pain   Subjective    Leg Pain  There was no injury mechanism. Pain location: Bilateral leg pain, left worse than right. The pain has been Worsening since onset. Associated symptoms include muscle weakness. Pertinent negatives include no inability to bear weight, loss of motion, loss of sensation, numbness or tingling. The symptoms are aggravated by weight bearing. She has tried NSAIDs for the symptoms.    She has a long history of leg pains similar to current sx related to OA, but is usually able to manage by limiting time on feet. 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 She usually works 8 hours days which are tolerable, but had to work 17 hours last weekend and has had pains ever since. Pt need work note saying she cannot work double shifts.    Medications: Outpatient Medications Prior to Visit  Medication Sig   amLODipine (NORVASC) 10 MG tablet Take 1 tablet (10 mg total) by mouth daily.   aspirin 81 MG chewable tablet Chew by mouth.   blood glucose meter kit and supplies Dispense based on patient and insurance preference. Use up to four times daily as directed. (FOR ICD-10 E10.9, E11.9).   Cholecalciferol (VITAMIN D3) 1000 UNITS CAPS Take by mouth.   Dulaglutide (TRULICITY) 1.5 QX/4.5WT SOPN Inject 1.5 mg into the skin once a week.   fluticasone (FLONASE) 50 MCG/ACT nasal spray Place 2 sprays into both nostrils daily.   glipiZIDE (GLUCOTROL XL) 10 MG 24 hr tablet Take 1 tablet by mouth once daily with breakfast   hydrochlorothiazide (HYDRODIURIL) 50 MG tablet Take 1 tablet (50 mg total) by mouth daily.   hydrocortisone 2.5%-nystatin-zinc oxide 20% 1:1:1 ointment mixture Apply topically 2 (two) times daily as needed.  Apply to groin folds   loratadine (CLARITIN) 10 MG tablet Take 1 tablet (10 mg total) by mouth daily.   losartan (COZAAR) 100 MG tablet Take 1 tablet (100 mg total) by mouth daily.   metFORMIN (GLUCOPHAGE) 500 MG tablet Take 2 tablets (1,000 mg total) by mouth 2 (two) times daily.   pravastatin (PRAVACHOL) 40 MG tablet Take 1 tablet (40 mg total) by mouth daily.   No facility-administered medications prior to visit.    Review of Systems  Constitutional: Negative.   Cardiovascular:  Positive for leg swelling. Negative for chest pain and palpitations.  Musculoskeletal:  Positive for gait problem and myalgias. Negative for arthralgias, back pain, joint swelling, neck pain and neck stiffness.  Neurological:  Negative for tingling and numbness.      Objective    BP 135/67 (BP Location: Right Arm, Patient Position: Sitting, Cuff Size: Large)   Pulse 79   Temp 98.5 F (36.9 C) (Oral)   Wt 267 lb (121.1 kg)   SpO2 100%   BMI 44.43 kg/m    Physical Exam   General appearance: Severely obese female, cooperative and in no acute distress Head: Normocephalic, without obvious abnormality, atraumatic Respiratory: Respirations even and unlabored, normal respiratory rate Extremities: All extremities are intact.  Skin: Skin color, texture, turgor normal. No rashes seen  Psych: Appropriate mood and affect. Neurologic: Mental status: Alert, oriented to person, place, and time, thought content appropriate.  Assessment & Plan     1. 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 Usually tolerable, but flared after having to work double shift, mostly on her feet at [employer] last weekend. Now improving although note quite to baseline. May RTW on 08/23/2021 with note that she can not work more than a single 8 hours shift per day.         The entirety of the information documented in the History of Present Illness, Review of Systems and Physical Exam were personally obtained by  me. Portions of this information were initially documented by the CMA and reviewed by me for thoroughness and accuracy.     Lelon Huh, MD  [facility] [phone] (phone) [phone] (fax)  [place]

## 2021-08-21 NOTE — Patient Instructions (Signed)
.   Please review the attached list of medications and notify my office if there are any errors.   . Please bring all of your medications to every appointment so we can make sure that our medication list is the same as yours.   

## 2021-08-21 NOTE — Telephone Encounter (Signed)
Patient was seen in office today by Dr. Caryn Section and note was written and signed. KW

## 2021-09-04 ENCOUNTER — Other Ambulatory Visit: Payer: Self-pay | Admitting: Family Medicine

## 2021-09-04 DIAGNOSIS — E1165 Type 2 diabetes mellitus with hyperglycemia: Secondary | ICD-10-CM

## 2021-09-16 ENCOUNTER — Other Ambulatory Visit: Payer: Self-pay | Admitting: Family Medicine

## 2021-09-16 DIAGNOSIS — E1165 Type 2 diabetes mellitus with hyperglycemia: Secondary | ICD-10-CM

## 2021-09-16 MED ORDER — GLIPIZIDE ER 10 MG PO TB24: 10.0000 mg | ORAL_TABLET | Freq: Every day | ORAL | 0 refills | Status: DC

## 2021-09-16 NOTE — Telephone Encounter (Signed)
Medication Refill - Medication: glipiZIDE (GLUCOTROL XL) 10 MG 24 hr tablet  Has the patient contacted their pharmacy? Yes.   (Agent: If no, request that the patient contact the pharmacy for the refill. If patient does not wish to contact the pharmacy document the reason why and proceed with request.) (Agent: If yes, when and what did the pharmacy advise?)  Preferred Pharmacy (with phone number or street name):  [facility] [place] - [address] Phone:  [phone]  Fax:  [phone]     Has the patient been seen for an appointment in the last year OR does the patient have an upcoming appointment? Yes.    Agent: Please be advised that RX refills may take up to 3 business days. We ask that you follow-up with your pharmacy.

## 2021-09-16 NOTE — Telephone Encounter (Signed)
Requested Prescriptions  Pending Prescriptions Disp Refills  . glipiZIDE (GLUCOTROL XL) 10 MG 24 hr tablet 90 tablet 0    Sig: Take 1 tablet (10 mg total) by mouth daily with breakfast.     Endocrinology:  Diabetes - Sulfonylureas Passed - 09/16/2021 10:21 AM      Passed - HBA1C is between 0 and 7.9 and within 180 days    Hemoglobin A1C  Date Value Ref Range Status  07/22/2021 7.0 (A) 4.0 - 5.6 % Final   Hgb A1c MFr Bld  Date Value Ref Range Status  04/11/2021 8.4 (H) 4.8 - 5.6 % Final    Comment:             Prediabetes: 5.7 - 6.4          Diabetes: >6.4          Glycemic control for adults with diabetes: <7.0          Passed - Valid encounter within last 6 months    Recent Outpatient Visits          3 weeks ago Leg pain, bilateral   [facility] Birdie Sons, MD   1 month ago Type 2 diabetes mellitus with hyperglycemia, without long-term current use of insulin [facility])   [facility] Tally Joe T, FNP   5 months ago Type 2 diabetes mellitus with other specified complication, without long-term current use of insulin [facility])   [facility] [place], Dionne Bucy, MD   8 months ago Hyperlipidemia associated with type 2 diabetes mellitus [facility])   [facility] [place], Clearnce Sorrel, [place]   12 months ago Annual physical exam   [facility] [place], Clearnce Sorrel, [place]      Future Appointments            In 1 month Rollene Rotunda, Jaci Standard, [place], [place]

## 2021-10-21 ENCOUNTER — Telehealth: Payer: Self-pay

## 2021-10-21 DIAGNOSIS — E1165 Type 2 diabetes mellitus with hyperglycemia: Secondary | ICD-10-CM

## 2021-10-21 MED ORDER — TRULICITY 1.5 MG/0.5ML ~~LOC~~ SOAJ: 1.5000 mg | SUBCUTANEOUS | 6 refills | Status: DC

## 2021-10-21 NOTE — Telephone Encounter (Signed)
[facility] faxed refill request for the following medications:  Dulaglutide (TRULICITY) 1.5 ET/6.2OE SOPN    Please advise.

## 2021-10-22 ENCOUNTER — Encounter: Payer: Self-pay | Admitting: Family Medicine

## 2021-10-22 ENCOUNTER — Ambulatory Visit (INDEPENDENT_AMBULATORY_CARE_PROVIDER_SITE_OTHER): Payer: HMO | Admitting: Family Medicine

## 2021-10-22 ENCOUNTER — Other Ambulatory Visit: Payer: Self-pay

## 2021-10-22 VITALS — BP 138/76 | HR 76 | Resp 16 | Wt 267.2 lb

## 2021-10-22 DIAGNOSIS — I152 Hypertension secondary to endocrine disorders: Secondary | ICD-10-CM

## 2021-10-22 DIAGNOSIS — B369 Superficial mycosis, unspecified: Secondary | ICD-10-CM | POA: Diagnosis not present

## 2021-10-22 DIAGNOSIS — E785 Hyperlipidemia, unspecified: Secondary | ICD-10-CM | POA: Diagnosis not present

## 2021-10-22 DIAGNOSIS — E11628 Type 2 diabetes mellitus with other skin complications: Secondary | ICD-10-CM | POA: Diagnosis not present

## 2021-10-22 DIAGNOSIS — L84 Corns and callosities: Secondary | ICD-10-CM | POA: Diagnosis not present

## 2021-10-22 DIAGNOSIS — B351 Tinea unguium: Secondary | ICD-10-CM

## 2021-10-22 DIAGNOSIS — E1159 Type 2 diabetes mellitus with other circulatory complications: Secondary | ICD-10-CM | POA: Diagnosis not present

## 2021-10-22 DIAGNOSIS — E1169 Type 2 diabetes mellitus with other specified complication: Secondary | ICD-10-CM | POA: Diagnosis not present

## 2021-10-22 DIAGNOSIS — E1165 Type 2 diabetes mellitus with hyperglycemia: Secondary | ICD-10-CM | POA: Diagnosis not present

## 2021-10-22 DIAGNOSIS — Z23 Encounter for immunization: Secondary | ICD-10-CM | POA: Diagnosis not present

## 2021-10-22 LAB — POCT GLYCOSYLATED HEMOGLOBIN (HGB A1C): Hemoglobin A1C: 7.4 % — AB (ref 4.0–5.6)

## 2021-10-22 MED ORDER — ZOSTER VAC RECOMB ADJUVANTED 50 MCG/0.5ML IM SUSR: 0.5000 mL | Freq: Once | INTRAMUSCULAR | 0 refills | Status: AC

## 2021-10-22 MED ORDER — TRIAMCINOLONE ACETONIDE 0.5 % EX OINT
1.0000 | TOPICAL_OINTMENT | Freq: Two times a day (BID) | CUTANEOUS | 0 refills | Status: DC
Start: 2021-10-22 — End: 2022-04-23

## 2021-10-22 NOTE — Assessment & Plan Note (Signed)
HTN HLD DM BMI at 44 Discussed importance of healthy weight management Discussed diet and exercise

## 2021-10-22 NOTE — Assessment & Plan Note (Signed)
Chronic, borderline- at 130s/80s Continue to focus on diet/exercise

## 2021-10-22 NOTE — Assessment & Plan Note (Signed)
Repeat lipid panel Remains on statin Goal of LDL <70

## 2021-10-22 NOTE — Assessment & Plan Note (Signed)
Continued use of Trulicity and oral medications Reported delay with use of Trulicity given weekly use makes it hard to remember Does not report changes in diet, exercise, medication compliance as a whole Weight and BP are stable Does not wish to make changes today Plan to increase water intake and focus on timing of use of injections Print out provided to assist with educational gaps

## 2021-10-22 NOTE — Assessment & Plan Note (Signed)
Check chem and ref to pods

## 2021-10-22 NOTE — Assessment & Plan Note (Signed)
Paper Rx provided as well as counseling to discuss cost with current insurance plan

## 2021-10-22 NOTE — Assessment & Plan Note (Signed)
Improved; pt concerned for color change in skin Trial of increased strength of steroid cream to assist with healing- 7 day max discussed, use of pea size amount or smaller given skin/skin location

## 2021-10-22 NOTE — Assessment & Plan Note (Signed)
Referral to podiatry given active fungus and thickening of nails as well as positive calluses

## 2021-10-22 NOTE — Progress Notes (Signed)
Established patient visit   Patient: Jodi Becker   DOB: 1956-08-09   66 y.o. Female  MRN: [id number] Visit Date: 10/22/2021  Today's healthcare provider: Gwyneth Sprout, FNP   Chief Complaint  Patient presents with   Diabetes   Subjective    HPI  Diabetes Mellitus Type II, Follow-up  Lab Results  Component Value Date   HGBA1C 7.4 (A) 10/22/2021   HGBA1C 7.0 (A) 07/22/2021   HGBA1C 8.4 (H) 04/11/2021   Wt Readings from Last 3 Encounters:  10/22/21 267 lb 3.2 oz (121.2 kg)  08/21/21 267 lb (121.1 kg)  08/06/21 266 lb (120.7 kg)   Last seen for diabetes 3 months ago.  Management since then includes none. She reports excellent compliance with treatment. She is not having side effects.  Symptoms: No fatigue No foot ulcerations  No appetite changes No nausea  No paresthesia of the feet  No polydipsia  No polyuria No visual disturbances   No vomiting     Home blood sugar records:  not checked  Episodes of hypoglycemia? 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, second shift  Current diet habits: well balanced  Pertinent Labs: Lab Results  Component Value Date   CHOL 127 04/11/2021   HDL 60 04/11/2021   LDLCALC 47 04/11/2021   TRIG 108 04/11/2021   CHOLHDL 2.1 04/11/2021   Lab Results  Component Value Date   NA 138 04/11/2021   K 4.5 04/11/2021   CREATININE 0.77 04/11/2021   EGFR 86 04/11/2021   MICROALBUR 50 03/13/2017     ---------------------------------------------------------------------------------------------------   Medications: Outpatient Medications Prior to Visit  Medication Sig   amLODipine (NORVASC) 10 MG tablet Take 1  tablet (10 mg total) by mouth daily.   aspirin 81 MG chewable tablet Chew by mouth.   blood glucose meter kit and supplies Dispense based on patient and insurance preference. Use up to four times daily as directed. (FOR ICD-10 E10.9, E11.9).   Cholecalciferol (VITAMIN D3) 1000 UNITS CAPS Take by mouth.   Dulaglutide (TRULICITY) 1.5 UD/3.1YH SOPN Inject 1.5 mg into the skin once a week.   fluticasone (FLONASE) 50 MCG/ACT nasal spray Place 2 sprays into both nostrils daily.   glipiZIDE (GLUCOTROL XL) 10 MG 24 hr tablet Take 1 tablet (10 mg total) by mouth daily with breakfast.   hydrochlorothiazide (HYDRODIURIL) 50 MG tablet Take 1 tablet (50 mg total) by mouth daily.   hydrocortisone 2.5%-nystatin-zinc oxide 20% 1:1:1 ointment mixture Apply topically 2 (two) times daily as needed. Apply to groin folds   Lancets (ONETOUCH DELICA PLUS [id number]) MISC Apply topically.   loratadine (CLARITIN) 10 MG tablet Take 1 tablet (10 mg total) by mouth daily.   losartan (COZAAR) 100 MG tablet Take 1 tablet (100 mg total) by mouth daily.   metFORMIN (GLUCOPHAGE) 500 MG tablet Take 2 tablets (1,000 mg total) by mouth 2 (two) times daily.   ONETOUCH ULTRA test strip USE TO CHECK BLOOD SUGAR UP TO 4 TIMES DAILY   pravastatin (PRAVACHOL) 40 MG tablet Take 1 tablet (40 mg total) by mouth daily.   No facility-administered medications prior to visit.    Review of Systems  Objective    BP 138/76    Pulse 76    Resp 16    Wt 267 lb 3.2 oz (121.2 kg)    SpO2 100%    BMI 44.46 kg/m    Physical Exam Vitals and nursing note reviewed.  Constitutional:      General: She is not in acute distress.    Appearance: Normal appearance. She is obese. She is not ill-appearing, toxic-appearing or diaphoretic.  HENT:     Head: Normocephalic and atraumatic.  Cardiovascular:     Rate and Rhythm: Normal rate and regular rhythm.     Pulses: Normal pulses.     Heart sounds: Normal heart sounds. No murmur heard.   No  friction rub. No gallop.  Pulmonary:     Effort: Pulmonary effort is normal. No respiratory distress.     Breath sounds: Normal breath sounds. No stridor. No wheezing, rhonchi or rales.  Chest:     Chest wall: No tenderness.  Abdominal:     General: Bowel sounds are normal.     Palpations: Abdomen is soft.  Musculoskeletal:        General: No swelling, tenderness, deformity or signs of injury. Normal range of motion.     Right lower leg: No edema.     Left lower leg: No edema.  Skin:    General: Skin is warm and dry.     Capillary Refill: Capillary refill takes less than 2 seconds.     Coloration: Skin is not jaundiced or pale.     Findings: No bruising, erythema, lesion or rash.  Neurological:     General: No focal deficit present.     Mental Status: She is alert and oriented to person, place, and time. Mental status is at baseline.     Cranial Nerves: No cranial nerve deficit.     Sensory: No sensory deficit.     Motor: No weakness.     Coordination: Coordination normal.  Psychiatric:        Mood and Affect: Mood normal.        Behavior: Behavior normal.        Thought Content: Thought content normal.        Judgment: Judgment normal.     Results for orders placed or performed in visit on 10/22/21  POCT glycosylated hemoglobin (Hb A1C)  Result Value Ref Range   Hemoglobin A1C 7.4 (A) 4.0 - 5.6 %   HbA1c POC (<> result, manual entry)     HbA1c, POC (prediabetic range)     HbA1c, POC (controlled diabetic range)      Assessment & Plan     Problem List Items Addressed This Visit       Cardiovascular and Mediastinum   Hypertension associated with diabetes ([place])    Chronic, borderline- at 130s/80s Continue to focus on diet/exercise        Endocrine   Hyperlipidemia associated with type 2 diabetes mellitus ([place])    Repeat lipid panel Remains on statin Goal of LDL <70       Relevant Orders   Lipid panel   Controlled type 2 diabetes mellitus with hyperglycemia,  without long-term current use of insulin ([place]) - Primary    Continued use of Trulicity and oral medications Reported delay with use of Trulicity given weekly use makes it hard to remember Does not report changes in diet, exercise, medication compliance as a whole Weight and BP are stable Does not wish to make changes today Plan  to increase water intake and focus on timing of use of injections Print out provided to assist with educational gaps      Relevant Orders   POCT glycosylated hemoglobin (Hb A1C) (Completed)   Comprehensive metabolic panel   Urine Microalbumin w/creat. 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)    Referral to podiatry given active fungus and thickening of nails as well as positive calluses       Relevant Orders   Ambulatory referral to Podiatry     Musculoskeletal and Integument   Fungal rash of torso    Improved; pt concerned for color change in skin Trial of increased strength of steroid cream to assist with healing- 7 day max discussed, use of pea size amount or smaller given skin/skin location        Relevant Medications   Zoster Vaccine Adjuvanted [facility]) injection   triamcinolone ointment (KENALOG) 0.5 %   Toenail fungus    Check chem and ref to pods      Relevant Medications   Zoster Vaccine Adjuvanted [facility]) injection   Other Relevant Orders   Ambulatory referral to Podiatry     Other   Morbid obesity ([place])    HTN HLD DM BMI at 26 Discussed importance of healthy weight management Discussed diet and exercise       Need for shingles vaccine    Paper Rx provided as well as counseling to discuss cost with current insurance plan      Relevant Medications   Zoster Vaccine Adjuvanted [facility]) injection     Return in about 3 months (around 01/20/2022) for chonic disease management.      Vonna Kotyk, FNP, have reviewed all documentation for this visit. The documentation on 10/22/21 for the exam,  diagnosis, procedures, and orders are all accurate and complete.    Gwyneth Sprout, [address] [phone] (phone) [phone] (fax)  [place]

## 2021-10-23 LAB — COMPREHENSIVE METABOLIC PANEL
ALT: 58 IU/L — ABNORMAL HIGH (ref 0–32)
AST: 37 IU/L (ref 0–40)
Albumin/Globulin Ratio: 2 (ref 1.2–2.2)
Albumin: 4.8 g/dL (ref 3.8–4.8)
Alkaline Phosphatase: 101 IU/L (ref 44–121)
BUN/Creatinine Ratio: 22 (ref 12–28)
BUN: 17 mg/dL (ref 8–27)
Bilirubin Total: 0.4 mg/dL (ref 0.0–1.2)
CO2: 23 mmol/L (ref 20–29)
Calcium: 11.8 mg/dL — ABNORMAL HIGH (ref 8.7–10.3)
Chloride: 98 mmol/L (ref 96–106)
Creatinine, Ser: 0.78 mg/dL (ref 0.57–1.00)
Globulin, Total: 2.4 g/dL (ref 1.5–4.5)
Glucose: 137 mg/dL — ABNORMAL HIGH (ref 70–99)
Potassium: 4.2 mmol/L (ref 3.5–5.2)
Sodium: 137 mmol/L (ref 134–144)
Total Protein: 7.2 g/dL (ref 6.0–8.5)
eGFR: 84 mL/min/{1.73_m2} (ref >59)

## 2021-10-23 LAB — LIPID PANEL
Chol/HDL Ratio: 2 ratio (ref 0.0–4.4)
Cholesterol, Total: 145 mg/dL (ref 100–199)
HDL: 72 mg/dL (ref >39)
LDL Chol Calc (NIH): 48 mg/dL (ref 0–99)
Triglycerides: 150 mg/dL — ABNORMAL HIGH (ref 0–149)
VLDL Cholesterol Cal: 25 mg/dL (ref 5–40)

## 2021-10-23 LAB — MICROALBUMIN / CREATININE URINE RATIO
Creatinine, Urine: 68.2 mg/dL
Microalb/Creat Ratio: <4 mg/g creat (ref 0–29)
Microalbumin, Urine: <3 ug/mL

## 2021-10-30 DIAGNOSIS — L600 Ingrowing nail: Secondary | ICD-10-CM | POA: Diagnosis not present

## 2021-10-30 DIAGNOSIS — L84 Corns and callosities: Secondary | ICD-10-CM | POA: Diagnosis not present

## 2021-10-30 DIAGNOSIS — M79675 Pain in left toe(s): Secondary | ICD-10-CM | POA: Diagnosis not present

## 2021-10-30 DIAGNOSIS — B351 Tinea unguium: Secondary | ICD-10-CM | POA: Diagnosis not present

## 2021-10-30 DIAGNOSIS — E11628 Type 2 diabetes mellitus with other skin complications: Secondary | ICD-10-CM | POA: Diagnosis not present

## 2021-10-30 DIAGNOSIS — E119 Type 2 diabetes mellitus without complications: Secondary | ICD-10-CM | POA: Diagnosis not present

## 2021-10-30 DIAGNOSIS — M79674 Pain in right toe(s): Secondary | ICD-10-CM | POA: Diagnosis not present

## 2021-10-30 DIAGNOSIS — R234 Changes in skin texture: Secondary | ICD-10-CM | POA: Diagnosis not present

## 2021-12-08 ENCOUNTER — Other Ambulatory Visit: Payer: Self-pay | Admitting: Family Medicine

## 2021-12-08 DIAGNOSIS — E1165 Type 2 diabetes mellitus with hyperglycemia: Secondary | ICD-10-CM

## 2021-12-10 NOTE — Telephone Encounter (Signed)
Note for an OV before refills is an old note.  Pt seen a month ago and has an upcoming appt so 90 day refill given.  Requested Prescriptions  Pending Prescriptions Disp Refills   glipiZIDE (GLUCOTROL XL) 10 MG 24 hr tablet [Pharmacy Med Name: glipiZIDE ER 10 MG Oral Tablet Extended Release 24 Hour] 90 tablet 0    Sig: TAKE 1 TABLET BY MOUTH ONCE DAILY WITH BREAKFAST . APPOINTMENT REQUIRED FOR FUTURE REFILLS     Endocrinology:  Diabetes - Sulfonylureas Passed - 12/08/2021  9:57 AM      Passed - HBA1C is between 0 and 7.9 and within 180 days    Hemoglobin A1C  Date Value Ref Range Status  10/22/2021 7.4 (A) 4.0 - 5.6 % Final   Hgb A1c MFr Bld  Date Value Ref Range Status  04/11/2021 8.4 (H) 4.8 - 5.6 % Final    Comment:             Prediabetes: 5.7 - 6.4          Diabetes: >6.4          Glycemic control for adults with diabetes: <7.0          Passed - Cr in normal range and within 360 days    Creatinine, Ser  Date Value Ref Range Status  10/22/2021 0.78 0.57 - 1.00 mg/dL Final         Passed - Valid encounter within last 6 months    Recent Outpatient Visits          1 month ago Controlled type 2 diabetes mellitus with hyperglycemia, without long-term current use of insulin [facility])   [facility] Tally Joe T, FNP   3 months ago Leg pain, bilateral   [facility] Birdie Sons, MD   4 months ago Type 2 diabetes mellitus with hyperglycemia, without long-term current use of insulin [facility])   [facility] Tally Joe T, FNP   8 months ago Type 2 diabetes mellitus with other specified complication, without long-term current use of insulin [facility])   [facility] [place], Dionne Bucy, MD   11 months ago Hyperlipidemia associated with type 2 diabetes mellitus [facility])   [facility] [place], Clearnce Sorrel, [place]      Future Appointments            In 1 month Rollene Rotunda, Jaci Standard, [place], [place]

## 2022-01-21 ENCOUNTER — Ambulatory Visit (INDEPENDENT_AMBULATORY_CARE_PROVIDER_SITE_OTHER): Payer: HMO | Admitting: Family Medicine

## 2022-01-21 ENCOUNTER — Encounter: Payer: Self-pay | Admitting: Family Medicine

## 2022-01-21 ENCOUNTER — Telehealth: Payer: Self-pay | Admitting: *Deleted

## 2022-01-21 VITALS — BP 140/73 | HR 72 | Temp 97.8°F | Resp 15 | Wt 269.4 lb

## 2022-01-21 DIAGNOSIS — I152 Hypertension secondary to endocrine disorders: Secondary | ICD-10-CM

## 2022-01-21 DIAGNOSIS — E1159 Type 2 diabetes mellitus with other circulatory complications: Secondary | ICD-10-CM

## 2022-01-21 DIAGNOSIS — E1165 Type 2 diabetes mellitus with hyperglycemia: Secondary | ICD-10-CM | POA: Diagnosis not present

## 2022-01-21 LAB — POCT GLYCOSYLATED HEMOGLOBIN (HGB A1C): Hemoglobin A1C: 7.2 % — AB (ref 4.0–5.6)

## 2022-01-21 MED ORDER — GLIPIZIDE ER 10 MG PO TB24: 10.0000 mg | ORAL_TABLET | Freq: Every day | ORAL | 1 refills | Status: DC

## 2022-01-21 NOTE — Progress Notes (Signed)
?  ?[employer] as a Education administrator for Gwyneth Sprout, FNP.,have documented all relevant documentation on the behalf of Gwyneth Sprout, FNP,as directed by  Gwyneth Sprout, FNP while in the presence of Gwyneth Sprout, FNP.  ? ?Established patient visit ? ? ?Patient: Jodi Becker   DOB: 09-Sep-1956   66 y.o. Female  MRN: [id number] ?Visit Date: 01/21/2022 ? ?Today's healthcare provider: Gwyneth Sprout, FNP  ? ?Re Introduced to nurse practitioner role and practice setting.  All questions answered.  Discussed provider/patient relationship and expectations. ? ? ?Chief Complaint  ?Patient presents with  ? Diabetes  ? Hypertension  ? Hyperlipidemia  ? ?Subjective  ?  ?HPI  ?Diabetes Mellitus Type II, Follow-up ? ?Lab Results  ?Component Value Date  ? HGBA1C 7.2 (A) 01/21/2022  ? HGBA1C 7.4 (A) 10/22/2021  ? HGBA1C 7.0 (A) 07/22/2021  ? ?Wt Readings from Last 3 Encounters:  ?01/21/22 269 lb 6.4 oz (122.2 kg)  ?10/22/21 267 lb 3.2 oz (121.2 kg)  ?08/21/21 267 lb (121.1 kg)  ? ?Last seen for diabetes 3 months ago.  ?Management since then includes none. ?She reports excellent compliance with treatment. ?She is not having side effects.  ? ?Symptoms: ?No fatigue No foot ulcerations  ?No appetite changes No nausea  ?Yes paresthesia of the feet  No polydipsia  ?No polyuria No visual disturbances   ?No vomiting   ? ? ?Home blood sugar records:  not checked ? ?Episodes of hypoglycemia? No  ?  ?Current insulin regiment: N/A ?Most Recent Eye Exam: Due ?Current exercise: walking ?Current diet habits: on average, 2 meals per day ? ?Pertinent Labs: ?Lab Results  ?Component Value Date  ? CHOL 145 10/22/2021  ? HDL 72 10/22/2021  ? [place] 48 10/22/2021  ? TRIG 150 (H) 10/22/2021  ? CHOLHDL 2.0 10/22/2021  ? Lab Results  ?Component Value Date  ? NA 137 10/22/2021  ? K 4.2 10/22/2021  ? CREATININE 0.78 10/22/2021  ? EGFR 84 10/22/2021  ? MICROALBUR 50 03/13/2017  ? LABMICR <3.0 10/22/2021  ?   ? ?---------------------------------------------------------------------------------------------------  ?Hypertension, follow-up ? ?BP Readings from Last 3 Encounters:  ?01/21/22 140/73  ?10/22/21 138/76  ?08/21/21 135/67  ? Wt Readings from Last 3 Encounters:  ?01/21/22 269 lb 6.4 oz (122.2 kg)  ?10/22/21 267 lb 3.2 oz (121.2 kg)  ?08/21/21 267 lb (121.1 kg)  ?  ? ?She was last seen for hypertension 3 months ago.  ?BP at that visit was 138/76. Management since that visit includes none. ? ?She reports excellent compliance with treatment. ?She is not having side effects.  ?She is following a Regular diet. ?She is exercising. ?She does not smoke. ? ?Use of agents associated with hypertension: none.  ? ?Outside blood pressures are not checked. ?Symptoms: ?No chest pain No chest pressure  ?No palpitations No syncope  ?No dyspnea No orthopnea  ?No paroxysmal nocturnal dyspnea Yes lower extremity edema  ? ?Pertinent labs ?Lab Results  ?Component Value Date  ? CHOL 145 10/22/2021  ? HDL 72 10/22/2021  ? [place] 48 10/22/2021  ? TRIG 150 (H) 10/22/2021  ? CHOLHDL 2.0 10/22/2021  ? Lab Results  ?Component Value Date  ? NA 137 10/22/2021  ? K 4.2 10/22/2021  ? CREATININE 0.78 10/22/2021  ? EGFR 84 10/22/2021  ? GLUCOSE 137 (H) 10/22/2021  ? TSH 1.060 09/17/2020  ?  ? ?The 10-year ASCVD risk score (Arnett DK, et al., 2019) is: 18% ? ?-------------------------------------------------------------------------------------------------- ?Lipid/Cholesterol, Follow-up ? ?  Last lipid panel Other pertinent labs  ?Lab Results  ?Component Value Date  ? CHOL 145 10/22/2021  ? HDL 72 10/22/2021  ? [place] 48 10/22/2021  ? TRIG 150 (H) 10/22/2021  ? CHOLHDL 2.0 10/22/2021  ? Lab Results  ?Component Value Date  ? ALT 58 (H) 10/22/2021  ? AST 37 10/22/2021  ? PLT 227 09/17/2020  ? TSH 1.060 09/17/2020  ?  ? ?She was last seen for this 3 months ago.  ?Management since that visit includes none. ? ?She reports excellent compliance with  treatment. ?She is not having side effects.  ? ?Symptoms: ?No chest pain No chest pressure/discomfort  ?No dyspnea No lower extremity edema  ?Yes numbness or tingling of extremity No orthopnea  ?No palpitations No paroxysmal nocturnal dyspnea  ?No speech difficulty No syncope  ? ?Current diet: well balanced ?Current exercise: walking ? ?The 10-year ASCVD risk score (Arnett DK, et al., 2019) is: 18% ? ?--------------------------------------------------------------------------------------------------- -  ?Medications: ?Outpatient Medications Prior to Visit  ?Medication Sig  ? amLODipine (NORVASC) 10 MG tablet Take 1 tablet (10 mg total) by mouth daily.  ? aspirin 81 MG chewable tablet Chew by mouth.  ? blood glucose meter kit and supplies Dispense based on patient and insurance preference. Use up to four times daily as directed. (FOR ICD-10 E10.9, E11.9).  ? Cholecalciferol (VITAMIN D3) 1000 UNITS CAPS Take by mouth.  ? Dulaglutide (TRULICITY) 1.5 EN/2.7PO SOPN Inject 1.5 mg into the skin once a week.  ? fluticasone (FLONASE) 50 MCG/ACT nasal spray Place 2 sprays into both nostrils daily.  ? hydrochlorothiazide (HYDRODIURIL) 50 MG tablet Take 1 tablet (50 mg total) by mouth daily.  ? hydrocortisone 2.5%-nystatin-zinc oxide 20% 1:1:1 ointment mixture Apply topically 2 (two) times daily as needed. Apply to groin folds  ? Lancets (ONETOUCH DELICA PLUS [id number]) MISC Apply topically.  ? loratadine (CLARITIN) 10 MG tablet Take 1 tablet (10 mg total) by mouth daily.  ? losartan (COZAAR) 100 MG tablet Take 1 tablet (100 mg total) by mouth daily.  ? metFORMIN (GLUCOPHAGE) 500 MG tablet Take 2 tablets (1,000 mg total) by mouth 2 (two) times daily.  ? ONETOUCH ULTRA test strip USE TO CHECK BLOOD SUGAR UP TO 4 TIMES DAILY  ? pravastatin (PRAVACHOL) 40 MG tablet Take 1 tablet (40 mg total) by mouth daily.  ? triamcinolone ointment (KENALOG) 0.5 % Apply 1 application topically 2 (two) times daily.  ? [DISCONTINUED] glipiZIDE  (GLUCOTROL XL) 10 MG 24 hr tablet TAKE 1 TABLET BY MOUTH ONCE DAILY WITH BREAKFAST . APPOINTMENT REQUIRED FOR FUTURE REFILLS  ? ?No facility-administered medications prior to visit.  ? ? ?Review of Systems ? ? ?  Objective  ?  ?BP 140/73   Pulse 72   Temp 97.8 ?F (36.6 ?C) (Temporal)   Resp 15   Wt 269 lb 6.4 oz (122.2 kg)   SpO2 100%   BMI 44.83 kg/m?  ? ? ?Physical Exam ?Vitals and nursing note reviewed.  ?Constitutional:   ?   General: She is not in acute distress. ?   Appearance: Normal appearance. She is obese. She is not ill-appearing, toxic-appearing or diaphoretic.  ?HENT:  ?   Head: Normocephalic and atraumatic.  ?Cardiovascular:  ?   Rate and Rhythm: Normal rate and regular rhythm.  ?   Pulses: Normal pulses.  ?   Heart sounds: Normal heart sounds. No murmur heard. ?  No friction rub. No gallop.  ?Pulmonary:  ?   Effort: Pulmonary effort is  normal. No respiratory distress.  ?   Breath sounds: Normal breath sounds. No stridor. No wheezing, rhonchi or rales.  ?Chest:  ?   Chest wall: No tenderness.  ?Abdominal:  ?   General: Bowel sounds are normal.  ?   Palpations: Abdomen is soft.  ?Musculoskeletal:     ?   General: No swelling, tenderness, deformity or signs of injury. Normal range of motion.  ?   Right lower leg: 1+ Pitting Edema present.  ?   Left lower leg: 2+ Pitting Edema present.  ?Skin: ?   General: Skin is warm and dry.  ?   Capillary Refill: Capillary refill takes less than 2 seconds.  ?   Coloration: Skin is not jaundiced or pale.  ?   Findings: No bruising, erythema, lesion or rash.  ?Neurological:  ?   General: No focal deficit present.  ?   Mental Status: She is alert and oriented to person, place, and time. Mental status is at baseline.  ?   Cranial Nerves: No cranial nerve deficit.  ?   Sensory: No sensory deficit.  ?   Motor: No weakness.  ?   Coordination: Coordination normal.  ?Psychiatric:     ?   Mood and Affect: Mood normal.     ?   Behavior: Behavior normal.     ?   Thought  Content: Thought content normal.     ?   Judgment: Judgment normal.  ?  ? ?Results for orders placed or performed in visit on 01/21/22  ?POCT glycosylated hemoglobin (Hb A1C)  ?Result Value Ref Range  ? Hemoglobin A1C 7.2 (A) 4.0 - 5.6 %  ? HbA1c POC (<> result,

## 2022-01-21 NOTE — Assessment & Plan Note (Addendum)
Well controlled with last A1c; 7.2% ?Continue current medications- trulicity 1.5 mg/wk; glipizide 10 mg; metformin 1000 mg BID ?Refer to CCM d/t cost of trulicity ?Will submit request for vaccines from [facility]- pt reports having PNA and 1st shingles ?Due for eye exam; pt encouraged to call and scheduled ?Was seen at [facility] in 1/23 ?On ACEi/ARB- Losartan '100mg'$  ?On Statin- Pravastain 40 mg ?Discussed diet and exercise ?Attachment to AVS on nutrition and DM ?F/u in 3 months for CPE ?

## 2022-01-21 NOTE — Chronic Care Management (AMB) (Signed)
?  Care Management  ? ?Note ? ?01/21/2022 ?Name: BRITTISH BOLINGER MRN: [id number] DOB: 1955/12/17 ? ?[place] A Azar is a 66 y.o. year old female who is a primary care patient of Gwyneth Sprout, FNP. I reached out to [name] by phone today offer care coordination services.  ? ?Ms. Lillo was given information about care management services today including:  ?Care management services include personalized support from designated clinical staff supervised by her physician, including individualized plan of care and coordination with other care providers ?24/7 contact phone numbers for assistance for urgent and routine care needs. ?The patient may stop care management services at any time by phone call to the office staff. ? ?Patient agreed to services and verbal consent obtained.  ? ?Follow up plan: ?Telephone appointment with care management team member scheduled for: 01/27/2022 ? ?Norwood Quezada, CCMA ?Care Guide, Embedded Care Coordination ?[facility]  Care Management  ?Direct Dial: [phone] ? ? ?

## 2022-01-21 NOTE — Assessment & Plan Note (Signed)
Body mass index is 44.83 kg/m?. ?Associated with HTN, DM, HLD ?Discussed importance of healthy weight management ?Discussed diet and exercise ?Reports high sodium diet with only 2 meals ?Has focused on increase in water intake since our last OV ?

## 2022-01-21 NOTE — Assessment & Plan Note (Addendum)
Chronic, elevated today, 140/73; reinforce goal of <130/<80 ?Denies CP ?Denies SOB/ DOE ?Denies low blood pressure/hypotension ?Denies vision changes ?Endorses LE Edema; see exam ?Continue medication, Norvasc 10 mg, HCTZ 50 mg, Losartan 100 mg ?Attachment on AVS for DASH diet and cooking with low salt ?RTC 3 months for CPE ?Seek emergent care if you develop chest pain or chest pressure ? ?

## 2022-01-23 ENCOUNTER — Telehealth: Payer: Self-pay

## 2022-01-23 NOTE — Progress Notes (Signed)
? ? ?Chronic Care Management ?Pharmacy Assistant  ? ?Name: Jodi Becker  MRN: [id number] DOB: October 11, 1956 ? ?Initial Visit with [name], CPP on 01/27/2022 @ 1300 ?Initial Visit Assessment ?  ?Conditions to be addressed/monitored: ?HTN, HLD, DMII, Allergic Rhinitis, and Mitral Incompetence, Morbid Obesity, ? ?Primary concerns for visit include: ?Patient's primary concern is financial hardship with affording her Trulicity  ? ?Recent office visits:  ?01/21/2022 Tally Joe, FNP (PCP Office Visit) for Type II DM- No medication changes noted, Lab order placed, Patient to follow-up in 3 months ? ?10/22/2021 Tally Joe, FNP (PCP Office Visit) for Type II DM- Started: Triamcinolone Acetonide 2.5% 1 application topical twice daily, Lab order placed, Referral to Podiatry placed, Patient to follow-up in 3 months. ? ?08/21/2021 Lelon Huh, MD (PCP Office Visit) for Leg Pain bilateral- No medication changes noted, No orders placed, No follow-up noted ? ?Recent consult visits:  ?10/30/2021 Yvetta Coder, DPM (Podiatry) for Nail Problem- No medication changes noted, No orders placed, No follow-up noted ? ?Hospital visits:  ?Medication Reconciliation was completed by comparing discharge summary, patient?s EMR and Pharmacy list, and upon discussion with patient. ? ?Admitted to the hospital on 08/06/2021 due to Colonoscopy with Propofol. Discharge date was 08/06/2021. Discharged from [facility].   ? ?New?Medications Started at [facility] Discharge:?? ?-Started None ID ? ?Medication Changes at Hospital Discharge: ?-Changed None ID ? ?Medications Discontinued at Hospital Discharge: ?-Stopped None ID ? ?Medications that remain the same after Hospital Discharge:??  ?-All other medications will remain the same.   ? ?Medications: ?Outpatient Encounter Medications as of 01/23/2022  ?Medication Sig Note  ? amLODipine (NORVASC) 10 MG tablet Take 1 tablet (10 mg total) by mouth daily.   ? aspirin  81 MG chewable tablet Chew by mouth. 04/25/2015: Received from: [facility]  ? blood glucose meter kit and supplies Dispense based on patient and insurance preference. Use up to four times daily as directed. (FOR ICD-10 E10.9, E11.9).   ? Cholecalciferol (VITAMIN D3) 1000 UNITS CAPS Take by mouth. 04/25/2015: Received from: [facility]  ? Dulaglutide (TRULICITY) 1.5 [place]/7.0WC SOPN Inject 1.5 mg into the skin once a week.   ? fluticasone (FLONASE) 50 MCG/ACT nasal spray Place 2 sprays into both nostrils daily.   ? glipiZIDE (GLUCOTROL XL) 10 MG 24 hr tablet Take 1 tablet (10 mg total) by mouth daily with breakfast.   ? hydrochlorothiazide (HYDRODIURIL) 50 MG tablet Take 1 tablet (50 mg total) by mouth daily.   ? hydrocortisone 2.5%-nystatin-zinc oxide 20% 1:1:1 ointment mixture Apply topically 2 (two) times daily as needed. Apply to groin folds   ? Lancets (ONETOUCH DELICA PLUS [id number]) MISC Apply topically.   ? loratadine (CLARITIN) 10 MG tablet Take 1 tablet (10 mg total) by mouth daily.   ? losartan (COZAAR) 100 MG tablet Take 1 tablet (100 mg total) by mouth daily.   ? metFORMIN (GLUCOPHAGE) 500 MG tablet Take 2 tablets (1,000 mg total) by mouth 2 (two) times daily.   ? ONETOUCH ULTRA test strip USE TO CHECK BLOOD SUGAR UP TO 4 TIMES DAILY   ? pravastatin (PRAVACHOL) 40 MG tablet Take 1 tablet (40 mg total) by mouth daily.   ? triamcinolone ointment (KENALOG) 0.5 % Apply 1 application topically 2 (two) times daily.   ? ?No facility-administered encounter medications on file as of 01/23/2022.  ? ?Care Gaps: ?Tetanus/TDAP ?Zoster Vaccines ?Diabetic Eye Exam ?COVID-19 Booster 4 ?PNA Vaccine  ?Dexa Scan ?Diabetic Foot Exam- Patient  recently saw Podiatry ? ?Star Rating Drugs: ?Glipizide 10 mg last filled on 09/17/2021 for a 90-Day supply with [facility] ?Trulicity 1.5 mg last filled on 08/01/2021 for a 84-Day supply with [facility] ?Metformin 500 mg last filled on  09/23/2021 for a 90-Day supply with [facility] ?Pravastatin 40 mg last filled on 09/25/2021 for a 90-Day supply with [facility] ?Losartan 100 mg last filled on 07/28/2021 for a 90-Day supply with [facility] ? ?Questions for Clinical Pharmacist:  ? ?1.Are you able to connect with Patient Yes ?   ?2.Confirmed appointment date/time with patient/caregiver? Confirm appointment on 01/27/2022 at 1300 with Daron Offer CPP ?   ?3.Visit type telephone ?   ?4.Patient/Caregiver instructed to bring medications to appointment. Patient is aware to bring all medications and supplements to appointment ?  ?5.What, if any, problems do you have getting your medications from the pharmacy? Financial barriers with affording Trulicity ? ?6.What is your top health concern to discuss at your upcoming visit?  ? ?Patient doesn't really have any concerns regarding her health just affording her medication. Patient stated that she still works and prior to be switching to [employer] her copayment with the insurance provided by her employer was only 25.00. Patient reports now her Trulicity cost 098.11. ? ?8.Any changes in your medications or health? no ? ?9.Any side effects from any medications? no ? ?10. Do you have an symptoms or problems not managed by your medications? no ? ?12. Has your provider asked that you check blood pressure, blood sugar, or follow special diet at home?  ? ?Patient states she doesn't check her blood sugars the way she probably should. The patient reports working 2nd shift so she just doesn't check her numbers regularly. Patient stated she is not adhering to a diabetic diet, but she does try to watch how much sugar she intakes. ? ?13. Do you get any type of exercise on a regular basis? no ? ? ?Lynann Bologna, CPA/CMA ?Clinical Pharmacist Assistant ?Phone: [phone]  ?

## 2022-01-27 ENCOUNTER — Ambulatory Visit (INDEPENDENT_AMBULATORY_CARE_PROVIDER_SITE_OTHER): Payer: HMO

## 2022-01-27 ENCOUNTER — Other Ambulatory Visit: Payer: Self-pay | Admitting: Family Medicine

## 2022-01-27 DIAGNOSIS — E1169 Type 2 diabetes mellitus with other specified complication: Secondary | ICD-10-CM

## 2022-01-27 DIAGNOSIS — J301 Allergic rhinitis due to pollen: Secondary | ICD-10-CM

## 2022-01-27 DIAGNOSIS — E11628 Type 2 diabetes mellitus with other skin complications: Secondary | ICD-10-CM

## 2022-01-27 DIAGNOSIS — I152 Hypertension secondary to endocrine disorders: Secondary | ICD-10-CM

## 2022-01-27 NOTE — Progress Notes (Signed)
? ?Chronic Care Management ?Pharmacy Note ? ?01/27/2022 ?Name:  Jodi Becker MRN:  [id number] DOB:  May 25, 1956 ? ?Summary: ?Patient presents for initial CCM consult.  ? ?-Patient unable to afford Trulicity, has 2-3 month supply at home.  ?-Finish supply of Trulicity, plan to start Ozempic 0.5 mg weekly once supply finished. Will start PAP today.  ? ?-Reports significant swelling despite HCTZ use.  ? ?Recommendations/Changes made from today's visit: ?-Finish supply of Trulicity, plan to start Ozempic 0.5 mg weekly once supply finished. Will start PAP today.  ? ?Plan: ?CPP follow-up in one month ? ?Subjective: ?Jodi Becker is an 66 y.o. year old female who is a primary patient of Gwyneth Sprout, FNP.  The CCM team was consulted for assistance with disease management and care coordination needs.   ? ?Engaged with patient by telephone for initial visit in response to provider referral for pharmacy case management and/or care coordination services.  ? ?Consent to Services:  ?The patient was given the following information about Chronic Care Management services today, agreed to services, and gave verbal consent: 1. CCM service includes personalized support from designated clinical staff supervised by the primary care provider, including individualized plan of care and coordination with other care providers 2. 24/7 contact phone numbers for assistance for urgent and routine care needs. 3. Service will only be billed when office clinical staff spend 20 minutes or more in a month to coordinate care. 4. Only one practitioner may furnish and bill the service in a calendar month. 5.The patient may stop CCM services at any time (effective at the end of the month) by phone call to the office staff. 6. The patient will be responsible for cost sharing (co-pay) of up to 20% of the service fee (after annual deductible is met). Patient agreed to services and consent obtained. ? ?Patient Care Team: ?Gwyneth Sprout, FNP as  PCP - General (Family Medicine) ?Germaine Pomfret, [facility] (Pharmacist) ? ?Recent office visits: ?01/21/2022 Tally Joe, FNP (PCP Office Visit) for Type II DM- No medication changes noted, Lab order placed, Patient to follow-up in 3 months ?  ?10/22/2021 Tally Joe, FNP (PCP Office Visit) for Type II DM- Started: Triamcinolone Acetonide 0.3% 1 application topical twice daily, Lab order placed, Referral to Podiatry placed, Patient to follow-up in 3 months. ?  ?08/21/2021 Lelon Huh, MD (PCP Office Visit) for Leg Pain bilateral- No medication changes noted, No orders placed, No follow-up noted ? ?Recent consult visits: ?10/30/2021 Yvetta Coder, DPM (Podiatry) for Nail Problem- No medication changes noted, No orders placed, No follow-up noted ? ?Hospital visits: ?Admitted to the hospital on 08/06/2021 due to Colonoscopy with Propofol. Discharge date was 08/06/2021. Discharged from [facility].   ? ? ?Objective: ? ?Lab Results  ?Component Value Date  ? CREATININE 0.78 10/22/2021  ? BUN 17 10/22/2021  ? EGFR 84 10/22/2021  ? GFRNONAA 77 09/17/2020  ? GFRAA 89 09/17/2020  ? NA 137 10/22/2021  ? K 4.2 10/22/2021  ? CALCIUM 11.8 (H) 10/22/2021  ? CO2 23 10/22/2021  ? GLUCOSE 137 (H) 10/22/2021  ? ? ?Lab Results  ?Component Value Date/Time  ? HGBA1C 7.2 (A) 01/21/2022 08:44 AM  ? HGBA1C 7.4 (A) 10/22/2021 08:18 AM  ? HGBA1C 8.4 (H) 04/11/2021 08:44 AM  ? HGBA1C 8.4 (H) 09/17/2020 08:09 AM  ? MICROALBUR 50 03/13/2017 08:38 AM  ? MICROALBUR 50 01/14/2016 08:36 AM  ?  ?Last diabetic Eye exam:  ?Lab Results  ?Component  Value Date/Time  ? HMDIABEYEEXA No Retinopathy 09/22/2019 12:00 AM  ?  ?Last diabetic Foot exam: No results found for: HMDIABFOOTEX  ? ?Lab Results  ?Component Value Date  ? CHOL 145 10/22/2021  ? HDL 72 10/22/2021  ? [place] 48 10/22/2021  ? TRIG 150 (H) 10/22/2021  ? CHOLHDL 2.0 10/22/2021  ? ? ? ?  Latest Ref Rng & Units 10/22/2021  ?  9:02 AM 04/11/2021  ?  8:44 AM  09/17/2020  ?  8:09 AM  ?Hepatic Function  ?Total Protein 6.0 - 8.5 g/dL 7.2   6.9   7.1    ?Albumin 3.8 - 4.8 g/dL 4.8   4.6   4.3    ?AST 0 - 40 IU/L 37   29   24    ?ALT 0 - 32 IU/L 58   45   40    ?Alk Phosphatase 44 - 121 IU/L 101   101   98    ?Total Bilirubin 0.0 - 1.2 mg/dL 0.4   0.4   0.4    ? ? ?Lab Results  ?Component Value Date/Time  ? TSH 1.060 09/17/2020 08:09 AM  ? TSH 1.390 08/25/2019 12:04 PM  ? ? ? ?  Latest Ref Rng & Units 09/17/2020  ?  8:09 AM 08/25/2019  ? 12:04 PM 11/13/2016  ?  9:23 AM  ?CBC  ?WBC 3.4 - 10.8 x10E3/uL 6.0   4.7   5.8    ?Hemoglobin 11.1 - 15.9 g/dL 12.9   13.4   13.4    ?Hematocrit 34.0 - 46.6 % 40.7   42.1   40.7    ?Platelets 150 - 450 x10E3/uL 227   205   221    ? ? ?No results found for: VD25OH ? ?Clinical ASCVD: No  ?The 10-year ASCVD risk score (Arnett DK, et al., 2019) is: 18% ?  Values used to calculate the score: ?    Age: 89 years ?    Sex: Female ?    Is Non-Hispanic African American: Yes ?    Diabetic: Yes ?    Tobacco smoker: No ?    Systolic Blood Pressure: 836 mmHg ?    Is BP treated: Yes ?    HDL Cholesterol: 72 mg/dL ?    Total Cholesterol: 145 mg/dL   ? ? ?  08/21/2021  ? 11:24 AM 01/03/2021  ?  8:31 AM 09/17/2020  ?  6:57 AM  ?Depression screen PHQ 2/9  ?Decreased Interest 0 3 0  ?Down, Depressed, Hopeless 0 0 0  ?PHQ - 2 Score 0 3 0  ?Altered sleeping 0 2   ?Tired, decreased energy 0 1   ?Change in appetite 0 0   ?Feeling bad or failure about yourself  0 0   ?Trouble concentrating 0 0   ?Moving slowly or fidgety/restless 0 0   ?Suicidal thoughts 0 0   ?PHQ-9 Score 0 6   ?Difficult doing work/chores Not difficult at all Not difficult at all   ?  ?Social History  ? ?Tobacco Use  ?Smoking Status Never  ?Smokeless Tobacco Never  ? ?BP Readings from Last 3 Encounters:  ?01/21/22 140/73  ?10/22/21 138/76  ?08/21/21 135/67  ? ?Pulse Readings from Last 3 Encounters:  ?01/21/22 72  ?10/22/21 76  ?08/21/21 79  ? ?Wt Readings from Last 3 Encounters:  ?01/21/22 269 lb 6.4 oz  (122.2 kg)  ?10/22/21 267 lb 3.2 oz (121.2 kg)  ?08/21/21 267 lb (121.1 kg)  ? ?BMI  Readings from Last 3 Encounters:  ?01/21/22 44.83 kg/m?  ?10/22/21 44.46 kg/m?  ?08/21/21 44.43 kg/m?  ? ? ?Assessment/Interventions: Review of patient past medical history, allergies, medications, health status, including review of consultants reports, laboratory and other test data, was performed as part of comprehensive evaluation and provision of chronic care management services.  ? ?SDOH:  (Social Determinants of Health) assessments and interventions performed: Yes ?SDOH Interventions   ? ?Flowsheet Row Most Recent Value  ?SDOH Interventions   ?Financial Strain Interventions Other (Comment)  [PAP]  ?Transportation Interventions Intervention Not Indicated  ? ?  ? ?SDOH Screenings  ? ?Alcohol Screen: Low Risk   ? Last Alcohol Screening Score (AUDIT): 0  ?Depression (PHQ2-9): Low Risk   ? PHQ-2 Score: 0  ?Financial Resource Strain: High Risk  ? Difficulty of Paying Living Expenses: Very hard  ?Food Insecurity: Not on file  ?Housing: Not on file  ?Physical Activity: Not on file  ?Social Connections: Not on file  ?Stress: Not on file  ?Tobacco Use: Low Risk   ? Smoking Tobacco Use: Never  ? Smokeless Tobacco Use: Never  ? Passive Exposure: Not on file  ?Transportation Needs: No Transportation Needs  ? Lack of Transportation (Medical): No  ? Lack of Transportation (Non-Medical): No  ? ? ?CCM Care Plan ? ?Allergies  ?Allergen Reactions  ? Sulfa Antibiotics   ? ? ?Medications Reviewed Today   ? ? Reviewed by Germaine Pomfret, [place] (Pharmacist) on 01/27/22 at [place] List Status: <None>  ? ?Medication Order Taking? Sig Documenting Provider Last Dose Status Informant  ?amLODipine (NORVASC) 10 MG tablet [id number] Yes Take 1 tablet (10 mg total) by mouth daily. Gwyneth Sprout, FNP Taking Active   ?aspirin 81 MG chewable tablet [id number] Yes Chew by mouth. [provider] Taking Active   ?         ?Med Note Michaelle Birks, [place] A    Mon Jan 27, 2022  1:28 PM)    ?b complex vitamins capsule [id number] Yes Take 1 capsule by mouth daily. [provider] Taking Active   ?Biotin 5000 MCG TABS [id number] Yes Take 2 tablets by mouth.

## 2022-01-27 NOTE — Patient Instructions (Signed)
Visit Information ?It was great speaking with you today!  Please let me know if you have any questions about our visit. ? ? Goals Addressed   ? ?  ?  ?  ?  ? This Visit's Progress  ?  Monitor and Manage My Blood Sugar-Diabetes Type 2     ?  Timeframe:  Long-Range Goal ?Priority:  High ?Start Date: 01/27/22                            ?Expected End Date: 01/28/23                     ? ?Follow Up within 30 days ?  ?- check blood sugar daily before breakfast ?- check blood sugar if I feel it is too high or too low ?- enter blood sugar readings and medication or insulin into daily log ?- take the blood sugar log to all doctor visits  ?  ?Why is this important?   ?Checking your blood sugar at home helps to keep it from getting very high or very low.  ?Writing the results in a diary or log helps the doctor know how to care for you.  ?Your blood sugar log should have the time, date and the results.  ?Also, write down the amount of insulin or other medicine that you take.  ?Other information, like what you ate, exercise done and how you were feeling, will also be helpful.   ?  ?Notes:  ?  ?  Track and Manage My Blood Pressure-Hypertension     ?  Timeframe:  Long-Range Goal ?Priority:  High ?Start Date: 01/27/22                            ?Expected End Date: 01/28/23                     ? ?Follow Up within 30 days ?  ?- check blood pressure weekly ?- write blood pressure results in a log or diary  ?  ?Why is this important?   ?You won't feel high blood pressure, but it can still hurt your blood vessels.  ?High blood pressure can cause heart or kidney problems. It can also cause a stroke.  ?Making lifestyle changes like losing a little weight or eating less salt will help.  ?Checking your blood pressure at home and at different times of the day can help to control blood pressure.  ?If the doctor prescribes medicine remember to take it the way the doctor ordered.  ?Call the office if you cannot afford the medicine or if there are  questions about it.   ?  ?Notes:  ?  ? ?  ? ? ?Patient Care Plan: General Pharmacy (Adult)  ?  ? ?Problem Identified: Hypertension, Hyperlipidemia, Diabetes, and Allergic Rhinitis   ?Priority: High  ?  ? ?Long-Range Goal: Patient-Specific Goal   ?Start Date: 01/27/2022  ?Expected End Date: 01/28/2023  ?This Visit's Progress: On track  ?Priority: High  ?Note:   ?Current Barriers:  ?Unable to independently afford treatment regimen ? ?Pharmacist Clinical Goal(s):  ?Patient will verbalize ability to afford treatment regimen through collaboration with PharmD and provider.  ? ?Interventions: ?1:1 collaboration with Gwyneth Sprout, FNP regarding development and update of comprehensive plan of care as evidenced by provider attestation and co-signature ?Inter-disciplinary care team collaboration (see longitudinal plan of  care) ?Comprehensive medication review performed; medication list updated in electronic medical record ? ?Hypertension (BP goal <130/80) ?-Uncontrolled ?-Current treatment: ?Amlodipine 10 mg daily: Appropriate, Effective, Query Safe ?HCTZ 50 mg daily: Appropriate, Effective, Query safe ?Losartan 100 mg daily: Appropriate, Effective, Safe, Accessible  ?-Medications previously tried: NA ?-Reports significant swelling despite HCTZ use.  ?-Current home readings: Does not monitor routinely ?-Defer medication changes until next visit with more home monitoring to help guide plan.  ?-Recommended to continue current medication ? ?Hyperlipidemia: (LDL goal < 70) ?-Controlled ?-Current treatment: ?Pravastatin 40 mg daily: Appropriate, Effective, Safe, Accessible  ?-Medications previously tried: NA  ?-Recommended to continue current medication ? ?Diabetes (A1c goal <7%) ?-Uncontrolled ?-Current medications: ?Trulicity 1.5 mg weekly: Appropriate, Effective, Safe, Query accessible  ?Glipizide XL 10 mg daily: Appropriate, Effective, Safe, Accessible  ?Metformin 500 mg 2 tablets twice daily: Appropriate, Effective, Safe,  Accessible  ?-Medications previously tried: Actos, Januvia, Tradjenta   ?-Current home glucose readings ?fasting glucose: Does not monitor routinely  ?-Possible candidate for Freestyle Libre 2  ?-Denies hypoglycemic/hyperglycemic symptoms ?-Patient unable to afford Trulicity, has 2-3 month supply at home.  ?-Finish supply of Trulicity, plan to start Ozempic 0.5 mg weekly once supply finished. Will start PAP today.  ? ?Allergic Rhinitis (Goal: Minimize symptoms) ?-Controlled ?-Current treatment  ?Flonase 50 mcg/act: Appropriate, Effective, Safe, Accessible  ?Loratadine 10 mg daily: Appropriate, Effective, Safe, Accessible  ?-Medications previously tried: NA  ?-Still has some breakthrough symptoms, much better control.  ?-Recommended to continue current medication ? ?Patient Goals/Self-Care Activities ?Patient will:  ?- check glucose daily before breakfast, document, and provide at future appointments ?check blood pressure weekly, document, and provide at future appointments ? ?Follow Up Plan: Telephone follow up appointment with care management team member scheduled for:  02/24/2022 at 10:15 AM ?  ? ?Ms. Bacorn was given information about Chronic Care Management services today including:  ?CCM service includes personalized support from designated clinical staff supervised by her physician, including individualized plan of care and coordination with other care providers ?24/7 contact phone numbers for assistance for urgent and routine care needs. ?Standard insurance, coinsurance, copays and deductibles apply for chronic care management only during months in which we provide at least 20 minutes of these services. Most insurances cover these services at 100%, however patients may be responsible for any copay, coinsurance and/or deductible if applicable. This service may help you avoid the need for more expensive face-to-face services. ?Only one practitioner may furnish and bill the service in a calendar month. ?The  patient may stop CCM services at any time (effective at the end of the month) by phone call to the office staff. ? ?Patient agreed to services and verbal consent obtained.  ? ?Patient verbalizes understanding of instructions and care plan provided today and agrees to view in [place]. Active MyChart status confirmed with patient.   ? ?[name], PharmD, BCACP, CPP  ?Clinical Pharmacist Practitioner  ?[place] ?[phone]  ?

## 2022-01-27 NOTE — Telephone Encounter (Signed)
Requested Prescriptions  ?Pending Prescriptions Disp Refills  ?? loratadine (CLARITIN) 10 MG tablet [Pharmacy Med Name: ALLERGY RELIEF (LOR) '10MG'$  TAB] 90 tablet 0  ?  Sig: Take 1 tablet by mouth once daily  ?  ? Ear, Nose, and Throat:  Antihistamines 2 Passed - 01/27/2022  9:30 AM  ?  ?  Passed - Cr in normal range and within 360 days  ?  Creatinine, Ser  ?Date Value Ref Range Status  ?10/22/2021 0.78 0.57 - 1.00 mg/dL Final  ?   ?  ?  Passed - Valid encounter within last 12 months  ?  Recent Outpatient Visits   ?      ? 6 days ago Controlled type 2 diabetes mellitus with hyperglycemia, without long-term current use of insulin ([place])  ? [facility] Tally Joe T, FNP  ? 3 months ago Controlled type 2 diabetes mellitus with hyperglycemia, without long-term current use of insulin ([place])  ? [facility] Gwyneth Sprout, FNP  ? 5 months ago Leg pain, bilateral  ? [facility] Birdie Sons, MD  ? 6 months ago Type 2 diabetes mellitus with hyperglycemia, without long-term current use of insulin ([place])  ? [facility] Gwyneth Sprout, FNP  ? 9 months ago Type 2 diabetes mellitus with other specified complication, without long-term current use of insulin ([place])  ? [facility] Bacigalupo, Dionne Bucy, MD  ?  ?  ?Future Appointments   ?        ? In 2 months Gwyneth Sprout, [place], P[facility]  ?  ? ?  ?  ?  ? ?

## 2022-01-29 ENCOUNTER — Telehealth: Payer: Self-pay

## 2022-01-29 NOTE — Progress Notes (Signed)
? ? ?  Chronic Care Management ?Pharmacy Assistant  ? ?Name: RAINEY RODGER  MRN: [id number] DOB: 1956/07/03 ? ?Patient Assistance Application for Ozempic ? ?I received a task from [name], CPP requesting that I start the process to get the patient approved for Ozempic that will be replacing her Trulicity. ? ?Patient assistance application started for Ozempic 0.5 mg weekly with [employer]. ? ?Application e-mailed to CPP/PTM for mailing to patient's home address per patient's request. Once patient has completed her portion she is to return it to clinic so CPP can fax it to [employer] for processing. ? ?Medications: ?Outpatient Encounter Medications as of 01/29/2022  ?Medication Sig  ? amLODipine (NORVASC) 10 MG tablet Take 1 tablet (10 mg total) by mouth daily.  ? aspirin 81 MG chewable tablet Chew by mouth.  ? b complex vitamins capsule Take 1 capsule by mouth daily.  ? Biotin 5000 MCG TABS Take 2 tablets by mouth.  ? blood glucose meter kit and supplies Dispense based on patient and insurance preference. Use up to four times daily as directed. (FOR ICD-10 E10.9, E11.9).  ? Cholecalciferol (VITAMIN D3) 1000 UNITS CAPS Take 2,000 Units by mouth daily.  ? Dulaglutide (TRULICITY) 1.5 ZT/2.4PY SOPN Inject 1.5 mg into the skin once a week.  ? fluticasone (FLONASE) 50 MCG/ACT nasal spray Place 2 sprays into both nostrils daily. (Patient taking differently: Place 2 sprays into both nostrils daily as needed.)  ? glipiZIDE (GLUCOTROL XL) 10 MG 24 hr tablet Take 1 tablet (10 mg total) by mouth daily with breakfast.  ? hydrochlorothiazide (HYDRODIURIL) 50 MG tablet Take 1 tablet (50 mg total) by mouth daily.  ? hydrocortisone 2.5%-nystatin-zinc oxide 20% 1:1:1 ointment mixture Apply topically 2 (two) times daily as needed. Apply to groin folds  ? Lancets (ONETOUCH DELICA PLUS [id number]) MISC Apply topically.  ? loratadine (CLARITIN) 10 MG tablet Take 1 tablet by mouth once daily  ? losartan (COZAAR) 100 MG tablet Take  1 tablet (100 mg total) by mouth daily.  ? Magnesium 100 MG TABS Take 100 mg by mouth daily.  ? metFORMIN (GLUCOPHAGE) 500 MG tablet Take 2 tablets (1,000 mg total) by mouth 2 (two) times daily.  ? ONETOUCH ULTRA test strip USE TO CHECK BLOOD SUGAR UP TO 4 TIMES DAILY  ? pravastatin (PRAVACHOL) 40 MG tablet Take 1 tablet (40 mg total) by mouth daily.  ? triamcinolone ointment (KENALOG) 0.5 % Apply 1 application topically 2 (two) times daily.  ? vitamin C (ASCORBIC ACID) 500 MG tablet Take 500 mg by mouth daily.  ? ?No facility-administered encounter medications on file as of 01/29/2022.  ? ?Lynann Bologna, CPA/CMA ?Clinical Pharmacist Assistant ?Phone: [phone]  ? ?

## 2022-02-09 DIAGNOSIS — Z7984 Long term (current) use of oral hypoglycemic drugs: Secondary | ICD-10-CM | POA: Diagnosis not present

## 2022-02-09 DIAGNOSIS — E785 Hyperlipidemia, unspecified: Secondary | ICD-10-CM | POA: Diagnosis not present

## 2022-02-09 DIAGNOSIS — I10 Essential (primary) hypertension: Secondary | ICD-10-CM

## 2022-02-09 DIAGNOSIS — E1159 Type 2 diabetes mellitus with other circulatory complications: Secondary | ICD-10-CM | POA: Diagnosis not present

## 2022-02-21 ENCOUNTER — Telehealth: Payer: Self-pay

## 2022-02-21 NOTE — Progress Notes (Signed)
    Chronic Care Management Pharmacy Assistant   Name: BREXLEY CUTSHAW  MRN: [id number] DOB: 10/25/1955  Patient called to be reminded of her telephone appointment with [name], CPP on 02/24/2022 @ 1015.  No answer, left message of appointment date, time and type of appointment (either telephone or in person). Left message to have all medications, supplements, blood pressure and/or blood sugar logs available during appointment and to return call if need to reschedule.  Star Rating Drug: Glipizide 10 mg last filled on 09/17/2021 for a 90-Day supply with [place] Trulicity 1.5 mg last filled on 08/01/2021 for a 84-Day supply with [place] Metformin 500 mg last filled on 09/23/2021 for a 90-Day supply with [place] Pravastatin 40 mg last filled on 09/25/2021 for a 90-Day supply with [place] Losartan 100 mg last filled on 07/28/2021 for a 90-Day supply with [place]  Any gaps in medications fill history? Yes  Care Gaps: Tetanus/TDAP Zoster Vaccines Diabetic Eye Exam COVID-19 Booster 4 PNA Vaccine  Dexa Scan Diabetic Foot Exam- Patient recently saw Podiatry  Lynann Bologna, [place] Pharmacist Assistant Phone: [phone]

## 2022-02-24 ENCOUNTER — Ambulatory Visit (INDEPENDENT_AMBULATORY_CARE_PROVIDER_SITE_OTHER): Payer: HMO

## 2022-02-24 DIAGNOSIS — E1169 Type 2 diabetes mellitus with other specified complication: Secondary | ICD-10-CM

## 2022-02-24 DIAGNOSIS — E1159 Type 2 diabetes mellitus with other circulatory complications: Secondary | ICD-10-CM

## 2022-02-24 MED ORDER — CHLORTHALIDONE 25 MG PO TABS: 25.0000 mg | ORAL_TABLET | Freq: Every day | ORAL | 1 refills | Status: DC

## 2022-02-24 NOTE — Progress Notes (Signed)
? ?Chronic Care Management ?Pharmacy Note ? ?02/25/2022 ?Name:  Jodi Becker MRN:  [id number] DOB:  [birth year] ? ?Summary: ?Patient presents for CCM follow-up.  ? ?-Home blood glucose elevated. Primary goals are weight loss, limit overeating.  ? ?-Home blood pressures mostly at goal ,but patient reports significant swelling despite HCTZ use.  ? ?Recommendations/Changes made from today's visit: ?-Finish supply of Trulicity, plan to start Ozempic 0.5 mg weekly once supply finished. Ozempic Patient Assistance form faxed for review on 02/25/22 ?-Stop HCTZ (Query effective at reducing swelling) ?-START Chlorthalidone 25 mg daily ?-Counseled extensively on carbohydrate counting, plate method  ? ?Plan: ?CPP follow-up in one month ? ?Subjective: ?Jodi Becker is an 66 y.o. year old female who is a primary patient of Gwyneth Sprout, FNP.  The CCM team was consulted for assistance with disease management and care coordination needs.   ? ?Engaged with patient by telephone for initial visit in response to provider referral for pharmacy case management and/or care coordination services.  ? ?Consent to Services:  ?The patient was given the following information about Chronic Care Management services today, agreed to services, and gave verbal consent: 1. CCM service includes personalized support from designated clinical staff supervised by the primary care provider, including individualized plan of care and coordination with other care providers 2. 24/7 contact phone numbers for assistance for urgent and routine care needs. 3. Service will only be billed when office clinical staff spend 20 minutes or more in a month to coordinate care. 4. Only one practitioner may furnish and bill the service in a calendar month. 5.The patient may stop CCM services at any time (effective at the end of the month) by phone call to the office staff. 6. The patient will be responsible for cost sharing (co-pay) of up to 20% of the service  fee (after annual deductible is met). Patient agreed to services and consent obtained. ? ?Patient Care Team: ?Gwyneth Sprout, FNP as PCP - General (Family Medicine) ?Germaine Pomfret, [facility] (Pharmacist) ? ?Recent office visits: ?01/21/2022 Tally Joe, FNP (PCP Office Visit) for Type II DM- No medication changes noted, Lab order placed, Patient to follow-up in 3 months ?  ?10/22/2021 Tally Joe, FNP (PCP Office Visit) for Type II DM- Started: Triamcinolone Acetonide 6.1% 1 application topical twice daily, Lab order placed, Referral to Podiatry placed, Patient to follow-up in 3 months. ?  ?08/21/2021 Lelon Huh, MD (PCP Office Visit) for Leg Pain bilateral- No medication changes noted, No orders placed, No follow-up noted ? ?Recent consult visits: ?10/30/2021 Yvetta Coder, DPM (Podiatry) for Nail Problem- No medication changes noted, No orders placed, No follow-up noted ? ?Hospital visits: ?Admitted to the hospital on 08/06/2021 due to Colonoscopy with Propofol. Discharge date was 08/06/2021. Discharged from [facility].   ? ? ?Objective: ? ?Lab Results  ?Component Value Date  ? CREATININE 0.78 10/22/2021  ? BUN 17 10/22/2021  ? EGFR 84 10/22/2021  ? GFRNONAA 77 09/17/2020  ? GFRAA 89 09/17/2020  ? NA 137 10/22/2021  ? K 4.2 10/22/2021  ? CALCIUM 11.8 (H) 10/22/2021  ? CO2 23 10/22/2021  ? GLUCOSE 137 (H) 10/22/2021  ? ? ?Lab Results  ?Component Value Date/Time  ? HGBA1C 7.2 (A) 01/21/2022 08:44 AM  ? HGBA1C 7.4 (A) 10/22/2021 08:18 AM  ? HGBA1C 8.4 (H) 04/11/2021 08:44 AM  ? HGBA1C 8.4 (H) 09/17/2020 08:09 AM  ? MICROALBUR 50 03/13/2017 08:38 AM  ? MICROALBUR 50 01/14/2016 08:36 AM  ?  ?  Last diabetic Eye exam:  ?Lab Results  ?Component Value Date/Time  ? HMDIABEYEEXA No Retinopathy 09/22/2019 12:00 AM  ?  ?Last diabetic Foot exam: No results found for: HMDIABFOOTEX  ? ?Lab Results  ?Component Value Date  ? CHOL 145 10/22/2021  ? HDL 72 10/22/2021  ? [place] 48 10/22/2021  ?  TRIG 150 (H) 10/22/2021  ? CHOLHDL 2.0 10/22/2021  ? ? ? ?  Latest Ref Rng & Units 10/22/2021  ?  9:02 AM 04/11/2021  ?  8:44 AM 09/17/2020  ?  8:09 AM  ?Hepatic Function  ?Total Protein 6.0 - 8.5 g/dL 7.2   6.9   7.1    ?Albumin 3.8 - 4.8 g/dL 4.8   4.6   4.3    ?AST 0 - 40 IU/L 37   29   24    ?ALT 0 - 32 IU/L 58   45   40    ?Alk Phosphatase 44 - 121 IU/L 101   101   98    ?Total Bilirubin 0.0 - 1.2 mg/dL 0.4   0.4   0.4    ? ? ?Lab Results  ?Component Value Date/Time  ? TSH 1.060 09/17/2020 08:09 AM  ? TSH 1.390 08/25/2019 12:04 PM  ? ? ? ?  Latest Ref Rng & Units 09/17/2020  ?  8:09 AM 08/25/2019  ? 12:04 PM 11/13/2016  ?  9:23 AM  ?CBC  ?WBC 3.4 - 10.8 x10E3/uL 6.0   4.7   5.8    ?Hemoglobin 11.1 - 15.9 g/dL 12.9   13.4   13.4    ?Hematocrit 34.0 - 46.6 % 40.7   42.1   40.7    ?Platelets 150 - 450 x10E3/uL 227   205   221    ? ? ?No results found for: VD25OH ? ?Clinical ASCVD: No  ?The 10-year ASCVD risk score (Arnett DK, et al., 2019) is: 18% ?  Values used to calculate the score: ?    Age: 18 years ?    Sex: Female ?    Is Non-Hispanic African American: Yes ?    Diabetic: Yes ?    Tobacco smoker: No ?    Systolic Blood Pressure: 761 mmHg ?    Is BP treated: Yes ?    HDL Cholesterol: 72 mg/dL ?    Total Cholesterol: 145 mg/dL   ? ? ?  08/21/2021  ? 11:24 AM 01/03/2021  ?  8:31 AM 09/17/2020  ?  6:57 AM  ?Depression screen PHQ 2/9  ?Decreased Interest 0 3 0  ?Down, Depressed, Hopeless 0 0 0  ?PHQ - 2 Score 0 3 0  ?Altered sleeping 0 2   ?Tired, decreased energy 0 1   ?Change in appetite 0 0   ?Feeling bad or failure about yourself  0 0   ?Trouble concentrating 0 0   ?Moving slowly or fidgety/restless 0 0   ?Suicidal thoughts 0 0   ?PHQ-9 Score 0 6   ?Difficult doing work/chores Not difficult at all Not difficult at all   ?  ?Social History  ? ?Tobacco Use  ?Smoking Status Never  ?Smokeless Tobacco Never  ? ?BP Readings from Last 3 Encounters:  ?01/21/22 140/73  ?10/22/21 138/76  ?08/21/21 135/67  ? ?Pulse Readings from  Last 3 Encounters:  ?01/21/22 72  ?10/22/21 76  ?08/21/21 79  ? ?Wt Readings from Last 3 Encounters:  ?01/21/22 269 lb 6.4 oz (122.2 kg)  ?10/22/21 267 lb 3.2 oz (121.2 kg)  ?  08/21/21 267 lb (121.1 kg)  ? ?BMI Readings from Last 3 Encounters:  ?01/21/22 44.83 kg/m?  ?10/22/21 44.46 kg/m?  ?08/21/21 44.43 kg/m?  ? ? ?Assessment/Interventions: Review of patient past medical history, allergies, medications, health status, including review of consultants reports, laboratory and other test data, was performed as part of comprehensive evaluation and provision of chronic care management services.  ? ?SDOH:  (Social Determinants of Health) assessments and interventions performed: Yes ? ? ?SDOH Screenings  ? ?Alcohol Screen: Low Risk   ? Last Alcohol Screening Score (AUDIT): 0  ?Depression (PHQ2-9): Low Risk   ? PHQ-2 Score: 0  ?Financial Resource Strain: High Risk  ? Difficulty of Paying Living Expenses: Very hard  ?Food Insecurity: Not on file  ?Housing: Not on file  ?Physical Activity: Not on file  ?Social Connections: Not on file  ?Stress: Not on file  ?Tobacco Use: Low Risk   ? Smoking Tobacco Use: Never  ? Smokeless Tobacco Use: Never  ? Passive Exposure: Not on file  ?Transportation Needs: No Transportation Needs  ? Lack of Transportation (Medical): No  ? Lack of Transportation (Non-Medical): No  ? ? ?CCM Care Plan ? ?Allergies  ?Allergen Reactions  ? Sulfa Antibiotics   ? ? ?Medications Reviewed Today   ? ? Reviewed by Germaine Pomfret, [place] (Pharmacist) on 01/27/22 at [place] List Status: <None>  ? ?Medication Order Taking? Sig Documenting Provider Last Dose Status Informant  ?amLODipine (NORVASC) 10 MG tablet [id number] Yes Take 1 tablet (10 mg total) by mouth daily. Gwyneth Sprout, FNP Taking Active   ?aspirin 81 MG chewable tablet [id number] Yes Chew by mouth. [provider] Taking Active   ?         ?Med Note Michaelle Birks, [place] A   Mon Jan 27, 2022  1:28 PM)    ?b complex vitamins capsule [id number]  Yes Take 1 capsule by mouth daily. [provider] Taking Active   ?Biotin 5000 MCG TABS [id number] Yes Take 2 tablets by mouth. [provider] Taking Active   ?blood glucose meter kit

## 2022-02-25 DIAGNOSIS — M79675 Pain in left toe(s): Secondary | ICD-10-CM | POA: Diagnosis not present

## 2022-02-25 DIAGNOSIS — M79674 Pain in right toe(s): Secondary | ICD-10-CM | POA: Diagnosis not present

## 2022-02-25 DIAGNOSIS — E119 Type 2 diabetes mellitus without complications: Secondary | ICD-10-CM | POA: Diagnosis not present

## 2022-02-25 DIAGNOSIS — B351 Tinea unguium: Secondary | ICD-10-CM | POA: Diagnosis not present

## 2022-02-25 NOTE — Patient Instructions (Signed)
Visit Information ?It was great speaking with you today!  Please let me know if you have any questions about our visit. ? ? Goals Addressed   ? ?  ?  ?  ?  ? This Visit's Progress  ?  Monitor and Manage My Blood Sugar-Diabetes Type 2   On track  ?  Timeframe:  Long-Range Goal ?Priority:  High ?Start Date: 01/27/22                            ?Expected End Date: 01/28/23                     ? ?Follow Up within 30 days ?  ?- check blood sugar daily before breakfast ?- check blood sugar if I feel it is too high or too low ?- enter blood sugar readings and medication or insulin into daily log ?- take the blood sugar log to all doctor visits  ?  ?Why is this important?   ?Checking your blood sugar at home helps to keep it from getting very high or very low.  ?Writing the results in a diary or log helps the doctor know how to care for you.  ?Your blood sugar log should have the time, date and the results.  ?Also, write down the amount of insulin or other medicine that you take.  ?Other information, like what you ate, exercise done and how you were feeling, will also be helpful.   ?  ?Notes:  ?  ?  Track and Manage My Blood Pressure-Hypertension   On track  ?  Timeframe:  Long-Range Goal ?Priority:  High ?Start Date: 01/27/22                            ?Expected End Date: 01/28/23                     ? ?Follow Up within 30 days ?  ?- check blood pressure weekly ?- write blood pressure results in a log or diary  ?  ?Why is this important?   ?You won't feel high blood pressure, but it can still hurt your blood vessels.  ?High blood pressure can cause heart or kidney problems. It can also cause a stroke.  ?Making lifestyle changes like losing a little weight or eating less salt will help.  ?Checking your blood pressure at home and at different times of the day can help to control blood pressure.  ?If the doctor prescribes medicine remember to take it the way the doctor ordered.  ?Call the office if you cannot afford the medicine  or if there are questions about it.   ?  ?Notes:  ?  ? ?  ? ? ?Patient Care Plan: General Pharmacy (Adult)  ?  ? ?Problem Identified: Hypertension, Hyperlipidemia, Diabetes, and Allergic Rhinitis   ?Priority: High  ?  ? ?Long-Range Goal: Patient-Specific Goal   ?Start Date: 01/27/2022  ?Expected End Date: 01/28/2023  ?This Visit's Progress: On track  ?Recent Progress: On track  ?Priority: High  ?Note:   ?Current Barriers:  ?Unable to independently afford treatment regimen ? ?Pharmacist Clinical Goal(s):  ?Patient will verbalize ability to afford treatment regimen through collaboration with PharmD and provider.  ? ?Interventions: ?1:1 collaboration with Gwyneth Sprout, FNP regarding development and update of comprehensive plan of care as evidenced by provider attestation and co-signature ?Inter-disciplinary  care team collaboration (see longitudinal plan of care) ?Comprehensive medication review performed; medication list updated in electronic medical record ? ?Hypertension (BP goal <130/80) ?-Controlled ?-Current treatment: ?Amlodipine 10 mg daily: Appropriate, Effective, Query Safe ?HCTZ 50 mg daily: Appropriate, Effective, Query safe ?Losartan 100 mg daily: Appropriate, Effective, Safe, Accessible  ?-Medications previously tried: NA ?-Current home blood pressures: ?133/77  ?119/72  ?110/67  ?117/70  ?125/75  ?126/74  ?116/73  ?140/76  ?-Reports significant swelling despite HCTZ use.  ?-Stop HCTZ (Query effective at reducing swelling) ?-START Chlorthalidone 25 mg daily  ? ?Hyperlipidemia: (LDL goal < 70) ?-Controlled ?-Current treatment: ?Pravastatin 40 mg daily: Appropriate, Effective, Safe, Accessible  ?-Medications previously tried: NA  ?-Recommended to continue current medication ? ?Diabetes (A1c goal <7%) ?-Uncontrolled ?-Current medications: ?Trulicity 1.5 mg weekly: Appropriate, Query effective ?Glipizide XL 10 mg daily: Appropriate, Query effective ?Metformin 500 mg 2 tablets twice daily: Appropriate, Query  effective ?-Medications previously tried: Actos, Januvia, Tradjenta   ?-Current home glucose readings ? Fasting  ?15-May 195  ?14-May 195  ?13-May 184  ?12-May 195  ?11-May 168  ?10-May 201  ?9-May 150  ?8-May 195  ?7-May 184  ?6-May 137  ?5-May 208  ?4-May 274  ?3-May 219  ?2-May 187  ?1-May 235  ?Average 195  ?-Possible candidate for Freestyle Libre 2  ?-Primary goals are weight loss, limit overeating.  ?-Denies hypoglycemic/hyperglycemic symptoms ?-Current meal patterns: Finds herself craving sugar. Does not go to bed until 4-5 am, often eats get a late-night snack.  ?Breakfast (10 AM): Pork Biscuit + ([employer])  ?Dinner (6 pm): Cheeseburger + Salad ?snacks: Cinnamon Roll, Oatmeal cookie  ?drinks: Water, sometimes will have coffee (artificial sweetener + creamer)  ?-Current exercise: Housework, working as a Technical brewer. Limited by leg pain.  ?-Counseled extensively on carbohydrate counting, plate method  ?-Patient unable to afford Trulicity, has 1-2 month supply at home.  ?-Finish supply of Trulicity, plan to start Ozempic 0.5 mg weekly once supply finished. Ozempic Patient Assistance form faxed for review on 02/25/22 ? ? ?Allergic Rhinitis (Goal: Minimize symptoms) ?-Controlled ?-Current treatment  ?Flonase 50 mcg/act: Appropriate, Effective, Safe, Accessible  ?Loratadine 10 mg daily: Appropriate, Effective, Safe, Accessible  ?-Medications previously tried: NA  ?-Still has some breakthrough symptoms, much better control.  ?-Recommended to continue current medication ? ?Patient Goals/Self-Care Activities ?Patient will:  ?- check glucose daily before breakfast, document, and provide at future appointments ?check blood pressure weekly, document, and provide at future appointments ? ?Follow Up Plan: Telephone follow up appointment with care management team member scheduled for:  03/24/2022 at 9:15 AM ?  ? ? ? ?Patient agreed to services and verbal consent obtained.  ? ?Patient verbalizes understanding of instructions and care  plan provided today and agrees to view in [place]. Active MyChart status confirmed with patient.   ? ?[name], PharmD, BCACP, CPP  ?Clinical Pharmacist Practitioner  ?[place] ?[phone] ?  ?

## 2022-03-12 DIAGNOSIS — Z794 Long term (current) use of insulin: Secondary | ICD-10-CM

## 2022-03-12 DIAGNOSIS — E1159 Type 2 diabetes mellitus with other circulatory complications: Secondary | ICD-10-CM

## 2022-03-12 DIAGNOSIS — E785 Hyperlipidemia, unspecified: Secondary | ICD-10-CM

## 2022-03-12 DIAGNOSIS — Z7984 Long term (current) use of oral hypoglycemic drugs: Secondary | ICD-10-CM

## 2022-03-12 DIAGNOSIS — I10 Essential (primary) hypertension: Secondary | ICD-10-CM

## 2022-03-21 ENCOUNTER — Telehealth: Payer: Self-pay

## 2022-03-21 NOTE — Progress Notes (Cosign Needed)
    Chronic Care Management Pharmacy Assistant   Name: [name]  MRN: [id number] DOB: 07-27-1956  Patient called to be reminded of her telephone appointment with [name], CPP on 03/24/2022 @ 0915.  No answer, left message of appointment date, time and type of appointment (either telephone or in person). Left message to have all medications, supplements, blood pressure and/or blood sugar logs available during appointment and to return call if need to reschedule.   Star Rating Drug: Glipizide 10 mg last filled on 09/17/2021 for a 90-Day supply with [place] Trulicity 1.5 mg last filled on 08/01/2021 for a 84-Day supply with [place] Metformin 500 mg last filled on 09/23/2021 for a 90-Day supply with [place] Pravastatin 40 mg last filled on 09/25/2021 for a 90-Day supply with [place] Losartan 100 mg last filled on 07/28/2021 for a 90-Day supply with [place]  Any gaps in medications fill history? Yes  Care Gaps: Tetanus/TDAP Zoster Vaccines Diabetic Eye Exam COVID-19 Booster 4 PNA Vaccine  Dexa Scan Diabetic Foot Exam- Patient recently saw Podiatry   Lynann Bologna, [place] Pharmacist Assistant Phone: [phone]

## 2022-03-24 ENCOUNTER — Ambulatory Visit (INDEPENDENT_AMBULATORY_CARE_PROVIDER_SITE_OTHER): Payer: HMO

## 2022-03-24 ENCOUNTER — Telehealth: Payer: Self-pay

## 2022-03-24 DIAGNOSIS — I152 Hypertension secondary to endocrine disorders: Secondary | ICD-10-CM

## 2022-03-24 MED ORDER — HYDROCHLOROTHIAZIDE 50 MG PO TABS: 50.0000 mg | ORAL_TABLET | Freq: Every day | ORAL | Status: DC

## 2022-03-24 NOTE — Progress Notes (Signed)
Chronic Care Management Pharmacy Note  03/24/2022 Name:  Jodi Becker MRN:  [id number] DOB:  04-07-1956  Summary: Patient presents for CCM follow-up.   -We tried switching to chlorthalidone, but felt like she experienced more swelling than while she was taking HCTZ, so she resumed HCTZ. She continues to experience her baseline swelling.    -Patient has 1 month supply of Trulicity at home have not heard back from [employer] Patient Assistance. Will reach out to [employer] to request update on application.   Recommendations/Changes made from today's visit: -Finish supply of Trulicity, plan to start Ozempic 1 mg weekly once supply finished.  -Resume HCTZ 50 mg daily.   Plan: CPP follow-up in one month  Subjective: [place] A Console is an 66 y.o. year old female who is a primary patient of Jacky Kindle, FNP.  The CCM team was consulted for assistance with disease management and care coordination needs.    Engaged with patient by telephone for follow up visit in response to provider referral for pharmacy case management and/or care coordination services.   Consent to Services:  The patient was given information about Chronic Care Management services, agreed to services, and gave verbal consent prior to initiation of services.  Please see initial visit note for detailed documentation.   Patient Care Team: Jacky Kindle, FNP as PCP - General (Family Medicine) Gaspar Cola, [facility] (Pharmacist)  Recent office visits: 01/21/2022 Merita Norton, FNP (PCP Office Visit) for Type II DM- No medication changes noted, Lab order placed, Patient to follow-up in 3 months   10/22/2021 Merita Norton, FNP (PCP Office Visit) for Type II DM- Started: Triamcinolone Acetonide 0.5% 1 application topical twice daily, Lab order placed, Referral to Podiatry placed, Patient to follow-up in 3 months.   08/21/2021 Mila Merry, MD (PCP Office Visit) for Leg Pain bilateral- No medication changes  noted, No orders placed, No follow-up noted  Recent consult visits: 10/30/2021 Arnold Long, DPM (Podiatry) for Nail Problem- No medication changes noted, No orders placed, No follow-up noted  Hospital visits: Admitted to the hospital on 08/06/2021 due to Colonoscopy with Propofol. Discharge date was 08/06/2021. Discharged from [facility].     Objective:  Lab Results  Component Value Date   CREATININE 0.78 10/22/2021   BUN 17 10/22/2021   EGFR 84 10/22/2021   GFRNONAA 77 09/17/2020   GFRAA 89 09/17/2020   NA 137 10/22/2021   K 4.2 10/22/2021   CALCIUM 11.8 (H) 10/22/2021   CO2 23 10/22/2021   GLUCOSE 137 (H) 10/22/2021    Lab Results  Component Value Date/Time   HGBA1C 7.2 (A) 01/21/2022 08:44 AM   HGBA1C 7.4 (A) 10/22/2021 08:18 AM   HGBA1C 8.4 (H) 04/11/2021 08:44 AM   HGBA1C 8.4 (H) 09/17/2020 08:09 AM   MICROALBUR 50 03/13/2017 08:38 AM   MICROALBUR 50 01/14/2016 08:36 AM    Last diabetic Eye exam:  Lab Results  Component Value Date/Time   HMDIABEYEEXA No Retinopathy 09/22/2019 12:00 AM    Last diabetic Foot exam: No results found for: "HMDIABFOOTEX"   Lab Results  Component Value Date   CHOL 145 10/22/2021   HDL 72 10/22/2021   LDLCALC 48 10/22/2021   TRIG 150 (H) 10/22/2021   CHOLHDL 2.0 10/22/2021       Latest Ref Rng & Units 10/22/2021    9:02 AM 04/11/2021    8:44 AM 09/17/2020    8:09 AM  Hepatic Function  Total Protein  6.0 - 8.5 g/dL 7.2  6.9  7.1   Albumin 3.8 - 4.8 g/dL 4.8  4.6  4.3   AST 0 - 40 IU/L 37  29  24   ALT 0 - 32 IU/L 58  45  40   Alk Phosphatase 44 - 121 IU/L 101  101  98   Total Bilirubin 0.0 - 1.2 mg/dL 0.4  0.4  0.4     Lab Results  Component Value Date/Time   TSH 1.060 09/17/2020 08:09 AM   TSH 1.390 08/25/2019 12:04 PM       Latest Ref Rng & Units 09/17/2020    8:09 AM 08/25/2019   12:04 PM 11/13/2016    9:23 AM  CBC  WBC 3.4 - 10.8 x10E3/uL 6.0  4.7  5.8   Hemoglobin 11.1 - 15.9  g/dL 12.9  13.4  13.4   Hematocrit 34.0 - 46.6 % 40.7  42.1  40.7   Platelets 150 - 450 x10E3/uL 227  205  221     No results found for: "VD25OH"  Clinical ASCVD: No  The 10-year ASCVD risk score (Arnett DK, et al., 2019) is: 18%   Values used to calculate the score:     Age: 66 years     Sex: Female     Is Non-Hispanic African American: Yes     Diabetic: Yes     Tobacco smoker: No     Systolic Blood Pressure: 712 mmHg     Is BP treated: Yes     HDL Cholesterol: 72 mg/dL     Total Cholesterol: 145 mg/dL       08/21/2021   11:24 AM 01/03/2021    8:31 AM 09/17/2020    6:57 AM  Depression screen PHQ 2/9  Decreased Interest 0 3 0  Down, Depressed, Hopeless 0 0 0  PHQ - 2 Score 0 3 0  Altered sleeping 0 2   Tired, decreased energy 0 1   Change in appetite 0 0   Feeling bad or failure about yourself  0 0   Trouble concentrating 0 0   Moving slowly or fidgety/restless 0 0   Suicidal thoughts 0 0   PHQ-9 Score 0 6   Difficult doing work/chores Not difficult at all Not difficult at all     Social History   Tobacco Use  Smoking Status Never  Smokeless Tobacco Never   BP Readings from Last 3 Encounters:  01/21/22 140/73  10/22/21 138/76  08/21/21 135/67   Pulse Readings from Last 3 Encounters:  01/21/22 72  10/22/21 76  08/21/21 79   Wt Readings from Last 3 Encounters:  01/21/22 269 lb 6.4 oz (122.2 kg)  10/22/21 267 lb 3.2 oz (121.2 kg)  08/21/21 267 lb (121.1 kg)   BMI Readings from Last 3 Encounters:  01/21/22 44.83 kg/m  10/22/21 44.46 kg/m  08/21/21 44.43 kg/m    Assessment/Interventions: Review of patient past medical history, allergies, medications, health status, including review of consultants reports, laboratory and other test data, was performed as part of comprehensive evaluation and provision of chronic care management services.   SDOH:  (Social Determinants of Health) assessments and interventions performed: Yes   SDOH Screenings   Alcohol  Screen: Low Risk  (08/21/2021)   Alcohol Screen    Last Alcohol Screening Score (AUDIT): 0  Depression (PHQ2-9): Low Risk  (08/21/2021)   Depression (PHQ2-9)    PHQ-2 Score: 0  Financial Resource Strain: High Risk (01/27/2022)   Overall Financial  Resource Strain (CARDIA)    Difficulty of Paying Living Expenses: Very hard  Food Insecurity: No Food Insecurity (11/23/2018)   Hunger Vital Sign    Worried About Running Out of Food in the Last Year: Never true    Ran Out of Food in the Last Year: Never true  Housing: Not on file  Physical Activity: Insufficiently Active (11/23/2018)   Exercise Vital Sign    Days of Exercise per Week: 3 days    Minutes of Exercise per Session: 30 min  Social Connections: Somewhat Isolated (11/23/2018)   Social Connection and Isolation Panel [NHANES]    Frequency of Communication with Friends and Family: More than three times a week    Frequency of Social Gatherings with Friends and Family: More than three times a week    Attends Religious Services: More than 4 times per year    Active Member of [employer] or Organizations: No    Attends Archivist Meetings: Never    Marital Status: Widowed  Stress: No Stress Concern Present (11/23/2018)   [place]    Feeling of Stress : Only a little  Tobacco Use: Low Risk  (01/21/2022)   Patient History    Smoking Tobacco Use: Never    Smokeless Tobacco Use: Never    Passive Exposure: Not on file  Transportation Needs: No Transportation Needs (01/27/2022)   PRAPARE - Transportation    Lack of Transportation (Medical): No    Lack of Transportation (Non-Medical): No    CCM Care Plan  Allergies  Allergen Reactions   Sulfa Antibiotics     Medications Reviewed Today     Reviewed by Germaine Pomfret, [facility] (Pharmacist) on 01/27/22 at [place] List Status: <None>   Medication Order Taking? Sig Documenting Provider Last Dose Status Informant   amLODipine (NORVASC) 10 MG tablet [id number] Yes Take 1 tablet (10 mg total) by mouth daily. Gwyneth Sprout, FNP Taking Active   aspirin 81 MG chewable tablet [id number] Yes Chew by mouth. [provider] Taking Active            Med Note Michaelle Birks, Felix Meras A   Mon Jan 27, 2022  1:28 PM)    b complex vitamins capsule [id number] Yes Take 1 capsule by mouth daily. [provider] Taking Active   Biotin 5000 MCG TABS [id number] Yes Take 2 tablets by mouth. [provider] Taking Active   blood glucose meter kit and supplies [id number]  Dispense based on patient and insurance preference. Use up to four times daily as directed. (FOR ICD-10 E10.9, E11.9). Tally Joe T, FNP  Active   Cholecalciferol (VITAMIN D3) 1000 UNITS CAPS [id number] Yes Take 2,000 Units by mouth daily. [provider] Taking Active            Med Note Michaelle Birks, Abdou Stocks A   Mon Jan 27, 2022  1:28 PM)    Dulaglutide (TRULICITY) 1.5 RA/3.0NM SOPN [id number] Yes Inject 1.5 mg into the skin once a week. Gwyneth Sprout, FNP Taking Active   fluticasone [facility]) 50 MCG/ACT nasal spray [id number] Yes Place 2 sprays into both nostrils daily.  Patient taking differently: Place 2 sprays into both nostrils daily as needed.   Chrismon, Vickki Muff, PA-C Taking Active   glipiZIDE (GLUCOTROL XL) 10 MG 24 hr tablet [id number] Yes Take 1 tablet (10 mg total) by mouth daily with breakfast. Gwyneth Sprout, FNP Taking Active   hydrochlorothiazide (HYDRODIURIL) 50 MG tablet  [id number] Yes Take 1 tablet (50 mg total) by mouth daily. Gwyneth Sprout, FNP Taking Active   hydrocortisone 2.5%-nystatin-zinc oxide 20% 1:1:1 ointment mixture [id number]  Apply topically 2 (two) times daily as needed. Apply to groin folds Gwyneth Sprout, FNP  Active   Lancets (ONETOUCH DELICA PLUS [id number]) MISC [id number]  Apply topically. [provider]  Active   loratadine (CLARITIN) 10 MG tablet [id number] Yes Take 1 tablet (10 mg total) by  mouth daily. Jerrol Banana., MD Taking Active   losartan (COZAAR) 100 MG tablet [id number] Yes Take 1 tablet (100 mg total) by mouth daily. Gwyneth Sprout, FNP Taking Active   Magnesium 100 MG TABS [id number] Yes Take 100 mg by mouth daily. [provider] Taking Active   metFORMIN (GLUCOPHAGE) 500 MG tablet [id number] Yes Take 2 tablets (1,000 mg total) by mouth 2 (two) times daily. Gwyneth Sprout, FNP Taking Active   [facility] ULTRA test strip [id number]  USE TO CHECK BLOOD SUGAR UP TO 4 TIMES DAILY [provider]  Active   pravastatin (PRAVACHOL) 40 MG tablet [id number] Yes Take 1 tablet (40 mg total) by mouth daily. Gwyneth Sprout, FNP Taking Active   triamcinolone ointment (KENALOG) 0.5 % [id number] Yes Apply 1 application topically 2 (two) times daily. Gwyneth Sprout, FNP Taking Active   vitamin C (ASCORBIC ACID) 500 MG tablet [id number] Yes Take 500 mg by mouth daily. 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 ([place]) 10/22/2021   Toenail fungus 10/22/2021   Need for shingles vaccine 10/22/2021   Leg pain, bilateral 08/21/2021   Colon cancer screening 07/22/2021   Fungal rash of torso 07/22/2021   Morbid obesity ([place]) 05/17/2020   T2DM (type 2 diabetes mellitus) ([place]) 11/13/2016   Allergic rhinitis 07/09/2015   Eustachian tube dysfunction 07/09/2015   Abnormal ECG 04/25/2015   History of colon cancer 75/44/9201   Diastolic dysfunction 00/71/2197   Generalized headache 04/25/2015   Cannot sleep 04/25/2015   MI (mitral incompetence) 04/25/2015   Hyperlipidemia associated with type 2 diabetes mellitus ([place]) 04/20/2015   Anterior tibial tendonitis 05/05/2014   Hypertension associated with diabetes ([place]) 05/24/2009    Immunization History   Administered Date(s) Administered   Influenza,inj,Quad PF,6+ Mos 07/09/2015   Influenza-Unspecified 07/22/2016, 07/27/2017   Moderna Sars-Covid-2 Vaccination 11/01/2019, 11/29/2019, 09/05/2020   Pneumococcal-Unspecified 12/05/2009   Zoster, Live 07/22/2016    Conditions to be addressed/monitored:  Hypertension, Hyperlipidemia, Diabetes, and Allergic Rhinitis  Care Plan : General Pharmacy (Adult)  Updates made by Germaine Pomfret, RPH since 03/24/2022 12:00 AM     Problem: Hypertension, Hyperlipidemia, Diabetes, and Allergic Rhinitis   Priority: High     Long-Range Goal: Patient-Specific Goal   Start Date: 01/27/2022  Expected End Date: 01/28/2023  This Visit's Progress: On track  Recent Progress: On track  Priority: High  Note:   Current Barriers:  Unable to independently afford treatment regimen  Pharmacist Clinical Goal(s):  Patient will verbalize ability to afford treatment regimen through collaboration with PharmD and provider.   Interventions: 1:1 collaboration with Gwyneth Sprout, FNP regarding development and update of comprehensive plan of care as evidenced by provider attestation and co-signature Inter-disciplinary care team collaboration (see longitudinal plan of care)  Comprehensive medication review performed; medication list updated in electronic medical record  Hypertension (BP goal <130/80) -Controlled -Current treatment: Amlodipine 10 mg daily: Appropriate, Effective, Query Safe HCTZ 50 mg daily: Appropriate, Effective, Safe, Accessible Losartan 100 mg daily: Appropriate, Effective, Safe, Accessible  -Medications previously tried: Chlorthalidone  -Current home blood pressures: 133/77 119/72 110/67 117/70 125/75 126/74 116/73 140/76 -We tried switching to chlorthalidone, but felt like she experienced more swelling than while she was taking HCTZ, so she resumed HCTZ. She continues to experience her baseline swelling.  -Resume HCTZ 50 mg daily.    Hyperlipidemia: (LDL goal < 70) -Controlled -Current treatment: Pravastatin 40 mg daily: Appropriate, Effective, Safe, Accessible  -Medications previously tried: NA  -Recommended to continue current medication  Diabetes (A1c goal <7%) -Uncontrolled -Current medications: Trulicity 1.5 mg weekly: Appropriate, Query effective Glipizide XL 10 mg daily: Appropriate, Query effective Metformin 500 mg 2 tablets twice daily: Appropriate, Query effective -Medications previously tried: Mauricio Po   -Current home glucose readings  Fasting  12-Jun 176  11-Jun 202  10-Jun 235  9-Jun 172  8-Jun 187  7-Jun 202  6-Jun 200  5-Jun 164  4-Jun 192  3-Jun 218  2-Jun 203  1-Jun 202  Average 196   -Possible candidate for Freestyle Libre 2, patient prefers to focus on starting Ozempic first.  -Primary goals are weight loss, limit overeating.  -Denies hypoglycemic/hyperglycemic symptoms -Current meal patterns: Finds herself craving sugar. Does not go to bed until 4-5 am, often eats get a late-night snack. Counseled extensively on carbohydrate counting, plate method, but patient unsure if she will be successful at following it.   -Current exercise: Housework, working as a Technical brewer. Limited by leg pain.   -Patient has 1 month supply of Trulicity at home have not heard back from [employer] Patient Assistance. Will reach out to [employer] to request update on application.  -Finish supply of Trulicity, plan to start Ozempic 1 mg weekly once supply finished.   Allergic Rhinitis (Goal: Minimize symptoms) -Controlled -Current treatment  Flonase 50 mcg/act: Appropriate, Effective, Safe, Accessible  Loratadine 10 mg daily: Appropriate, Effective, Safe, Accessible  -Medications previously tried: NA  -Still has some breakthrough symptoms, much better control.  -Recommended to continue current medication  Patient Goals/Self-Care Activities Patient will:  - check glucose daily before  breakfast, document, and provide at future appointments check blood pressure weekly, document, and provide at future appointments  Follow Up Plan: Telephone follow up appointment with care management team member scheduled for:  05/05/2022 at 8:30 AM        Medication Assistance: Application for Ozempic  medication assistance program. in process.  Anticipated assistance start date TBD.  See plan of care for additional detail.  Compliance/Adherence/Medication fill history: Care Gaps: Tetanus/TDAP Zoster Vaccines Diabetic Eye Exam COVID-19 Booster 4 PNA Vaccine  Dexa Scan Diabetic Foot Exam- Patient recently saw Podiatry  [facility]: Glipizide 10 mg last filled on 09/17/2021 for a 90-Day supply with [place] Trulicity 1.5 mg last filled on 08/01/2021 for a 84-Day supply with [place] Metformin 500 mg last filled on 09/23/2021 for a 90-Day supply with [place] Pravastatin 40 mg last filled on 09/25/2021 for a 90-Day supply with [facility] Losartan 100 mg last filled on 07/28/2021 for a 90-Day supply with [place]  Patient's preferred pharmacy is:  [facility] [address] ([address]), [place] [address] [place]) [place] [address] Phone: [phone] Fax: [phone]  [facility] - [address], [place] [address] Phone: [phone] Fax: [phone]  Uses pill box? Yes Pt endorses 100% compliance  We discussed: Current pharmacy is preferred with insurance plan and patient is satisfied with pharmacy services Patient decided to: Continue current medication management strategy  Care Plan and Follow Up Patient Decision:  Patient agrees to Care Plan and Follow-up.  Plan: Telephone follow up appointment with care management team member scheduled for:  05/05/2022 at 8:30 AM  [name], PharmD, [place], [place] [phone]

## 2022-03-24 NOTE — Progress Notes (Cosign Needed)
    Chronic Care Management Pharmacy Assistant   Name: Jodi Becker  MRN: [id number] DOB: [birth year]  Reason for Encounter: Patient Assistance Application Follow-up  I received a message from [name], CPP advising the patient still has not heard anything from [employer] regarding her application for patient assistance for Ozempic. CPP requested I give [place] a call to find out the status of the patient's application.  I called [employer] and spoke with the representative who advised that they did receive the patient's application but they are missing the household size and the patient's signature on page 3. CPP has been informed.  Message left for the patient informing her that her application is missing her signature and her household size. Patient advised application will be at the front desk for her to complete.   Medications: Outpatient Encounter Medications as of 03/24/2022  Medication Sig   amLODipine (NORVASC) 10 MG tablet Take 1 tablet (10 mg total) by mouth daily.   aspirin 81 MG chewable tablet Chew by mouth.   b complex vitamins capsule Take 1 capsule by mouth daily.   Biotin 5000 MCG TABS Take 2 tablets by mouth.   blood glucose meter kit and supplies Dispense based on patient and insurance preference. Use up to four times daily as directed. (FOR ICD-10 E10.9, E11.9).   chlorthalidone (HYGROTON) 25 MG tablet Take 1 tablet (25 mg total) by mouth daily.   Cholecalciferol (VITAMIN D3) 1000 UNITS CAPS Take 2,000 Units by mouth daily.   Dulaglutide (TRULICITY) 1.5 WK/0.8UP SOPN Inject 1.5 mg into the skin once a week.   fluticasone (FLONASE) 50 MCG/ACT nasal spray Place 2 sprays into both nostrils daily. (Patient taking differently: Place 2 sprays into both nostrils daily as needed.)   glipiZIDE (GLUCOTROL XL) 10 MG 24 hr tablet Take 1 tablet (10 mg total) by mouth daily with breakfast.   hydrocortisone 2.5%-nystatin-zinc oxide 20% 1:1:1 ointment mixture Apply  topically 2 (two) times daily as needed. Apply to groin folds   Lancets (ONETOUCH DELICA PLUS [id number]) MISC Apply topically.   loratadine (CLARITIN) 10 MG tablet Take 1 tablet by mouth once daily   losartan (COZAAR) 100 MG tablet Take 1 tablet (100 mg total) by mouth daily.   Magnesium 100 MG TABS Take 100 mg by mouth daily.   metFORMIN (GLUCOPHAGE) 500 MG tablet Take 2 tablets (1,000 mg total) by mouth 2 (two) times daily.   ONETOUCH ULTRA test strip USE TO CHECK BLOOD SUGAR UP TO 4 TIMES DAILY   pravastatin (PRAVACHOL) 40 MG tablet Take 1 tablet (40 mg total) by mouth daily.   triamcinolone ointment (KENALOG) 0.5 % Apply 1 application topically 2 (two) times daily.   vitamin C (ASCORBIC ACID) 500 MG tablet Take 500 mg by mouth daily.   No facility-administered encounter medications on file as of 03/24/2022.   Lynann Bologna, CPA/CMA Clinical Pharmacist Assistant Phone: [phone]

## 2022-03-24 NOTE — Patient Instructions (Signed)
Visit Information It was great speaking with you today!  Please let me know if you have any questions about our visit.   Goals Addressed             This Visit's Progress    Monitor and Manage My Blood Sugar-Diabetes Type 2   On track    Timeframe:  Long-Range Goal Priority:  High Start Date: 01/27/22                            Expected End Date: 01/28/23                      Follow Up within 30 days   - check blood sugar daily before breakfast - check blood sugar if I feel it is too high or too low - enter blood sugar readings and medication or insulin into daily log - take the blood sugar log to all doctor visits    Why is this important?   Checking your blood sugar at home helps to keep it from getting very high or very low.  Writing the results in a diary or log helps the doctor know how to care for you.  Your blood sugar log should have the time, date and the results.  Also, write down the amount of insulin or other medicine that you take.  Other information, like what you ate, exercise done and how you were feeling, will also be helpful.     Notes:      Track and Manage My Blood Pressure-Hypertension   On track    Timeframe:  Long-Range Goal Priority:  High Start Date: 01/27/22                            Expected End Date: 01/28/23                      Follow Up within 30 days   - check blood pressure weekly - write blood pressure results in a log or diary    Why is this important?   You won't feel high blood pressure, but it can still hurt your blood vessels.  High blood pressure can cause heart or kidney problems. It can also cause a stroke.  Making lifestyle changes like losing a little weight or eating less salt will help.  Checking your blood pressure at home and at different times of the day can help to control blood pressure.  If the doctor prescribes medicine remember to take it the way the doctor ordered.  Call the office if you cannot afford the medicine  or if there are questions about it.     Notes:         Patient Care Plan: General Pharmacy (Adult)     Problem Identified: Hypertension, Hyperlipidemia, Diabetes, and Allergic Rhinitis   Priority: High     Long-Range Goal: Patient-Specific Goal   Start Date: 01/27/2022  Expected End Date: 01/28/2023  This Visit's Progress: On track  Recent Progress: On track  Priority: High  Note:   Current Barriers:  Unable to independently afford treatment regimen  Pharmacist Clinical Goal(s):  Patient will verbalize ability to afford treatment regimen through collaboration with PharmD and provider.   Interventions: 1:1 collaboration with Gwyneth Sprout, FNP regarding development and update of comprehensive plan of care as evidenced by provider attestation and co-signature Inter-disciplinary  care team collaboration (see longitudinal plan of care) Comprehensive medication review performed; medication list updated in electronic medical record  Hypertension (BP goal <130/80) -Controlled -Current treatment: Amlodipine 10 mg daily: Appropriate, Effective, Query Safe HCTZ 50 mg daily: Appropriate, Effective, Safe, Accessible Losartan 100 mg daily: Appropriate, Effective, Safe, Accessible  -Medications previously tried: Chlorthalidone  -Current home blood pressures: 133/77 119/72 110/67 117/70 125/75 126/74 116/73 140/76 -We tried switching to chlorthalidone, but felt like she experienced more swelling than while she was taking HCTZ, so she resumed HCTZ. She continues to experience her baseline swelling.  -Resume HCTZ 50 mg daily.   Hyperlipidemia: (LDL goal < 70) -Controlled -Current treatment: Pravastatin 40 mg daily: Appropriate, Effective, Safe, Accessible  -Medications previously tried: NA  -Recommended to continue current medication  Diabetes (A1c goal <7%) -Uncontrolled -Current medications: Trulicity 1.5 mg weekly: Appropriate, Query effective Glipizide XL 10 mg daily:  Appropriate, Query effective Metformin 500 mg 2 tablets twice daily: Appropriate, Query effective -Medications previously tried: Mauricio Po   -Current home glucose readings  Fasting  12-Jun 176  11-Jun 202  10-Jun 235  9-Jun 172  8-Jun 187  7-Jun 202  6-Jun 200  5-Jun 164  4-Jun 192  3-Jun 218  2-Jun 203  1-Jun 202  Average 196   -Possible candidate for Freestyle Libre 2, patient prefers to focus on starting Ozempic first.  -Primary goals are weight loss, limit overeating.  -Denies hypoglycemic/hyperglycemic symptoms -Current meal patterns: Finds herself craving sugar. Does not go to bed until 4-5 am, often eats get a late-night snack. Counseled extensively on carbohydrate counting, plate method, but patient unsure if she will be successful at following it.   -Current exercise: Housework, working as a [employer]. Limited by leg pain.   -Patient has 1 month supply of Trulicity at home have not heard back from [employer] Patient Assistance. Will reach out to [employer] to request update on application.  -Finish supply of Trulicity, plan to start Ozempic 1 mg weekly once supply finished.   Allergic Rhinitis (Goal: Minimize symptoms) -Controlled -Current treatment  Flonase 50 mcg/act: Appropriate, Effective, Safe, Accessible  Loratadine 10 mg daily: Appropriate, Effective, Safe, Accessible  -Medications previously tried: NA  -Still has some breakthrough symptoms, much better control.  -Recommended to continue current medication  Patient Goals/Self-Care Activities Patient will:  - check glucose daily before breakfast, document, and provide at future appointments check blood pressure weekly, document, and provide at future appointments  Follow Up Plan: Telephone follow up appointment with care management team member scheduled for:  05/05/2022 at 8:30 AM    Patient agreed to services and verbal consent obtained.   Patient verbalizes understanding of instructions and  care plan provided today and agrees to view in [place]. Active MyChart status and patient understanding of how to access instructions and care plan via MyChart confirmed with patient.     [name], PharmD, Para March, CPP  Clinical Pharmacist Practitioner  [facility] [phone]

## 2022-04-11 DIAGNOSIS — E785 Hyperlipidemia, unspecified: Secondary | ICD-10-CM

## 2022-04-11 DIAGNOSIS — I10 Essential (primary) hypertension: Secondary | ICD-10-CM

## 2022-04-11 DIAGNOSIS — E1159 Type 2 diabetes mellitus with other circulatory complications: Secondary | ICD-10-CM

## 2022-04-11 DIAGNOSIS — Z7984 Long term (current) use of oral hypoglycemic drugs: Secondary | ICD-10-CM

## 2022-04-20 ENCOUNTER — Other Ambulatory Visit: Payer: Self-pay | Admitting: Family Medicine
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
## 2022-04-22 ENCOUNTER — Encounter: Payer: HMO | Admitting: Family Medicine

## 2022-04-23 ENCOUNTER — Ambulatory Visit (INDEPENDENT_AMBULATORY_CARE_PROVIDER_SITE_OTHER): Payer: HMO | Admitting: Family Medicine

## 2022-04-23 ENCOUNTER — Encounter: Payer: Self-pay | Admitting: Family Medicine

## 2022-04-23 VITALS — BP 136/72 | HR 72 | Temp 98.5°F | Resp 16 | Ht 65.0 in | Wt 273.1 lb

## 2022-04-23 DIAGNOSIS — I152 Hypertension secondary to endocrine disorders: Secondary | ICD-10-CM

## 2022-04-23 DIAGNOSIS — Z0000 Encounter for general adult medical examination without abnormal findings: Secondary | ICD-10-CM | POA: Insufficient documentation

## 2022-04-23 DIAGNOSIS — E1169 Type 2 diabetes mellitus with other specified complication: Secondary | ICD-10-CM | POA: Diagnosis not present

## 2022-04-23 DIAGNOSIS — E119 Type 2 diabetes mellitus without complications: Secondary | ICD-10-CM | POA: Insufficient documentation

## 2022-04-23 DIAGNOSIS — Z1231 Encounter for screening mammogram for malignant neoplasm of breast: Secondary | ICD-10-CM | POA: Diagnosis not present

## 2022-04-23 DIAGNOSIS — Z13820 Encounter for screening for osteoporosis: Secondary | ICD-10-CM

## 2022-04-23 DIAGNOSIS — E1159 Type 2 diabetes mellitus with other circulatory complications: Secondary | ICD-10-CM | POA: Diagnosis not present

## 2022-04-23 MED ORDER — HYDROCHLOROTHIAZIDE 50 MG PO TABS: 50.0000 mg | ORAL_TABLET | Freq: Every day | ORAL | 1 refills | Status: DC

## 2022-04-23 MED ORDER — GLIPIZIDE ER 10 MG PO TB24: 10.0000 mg | ORAL_TABLET | Freq: Every day | ORAL | 1 refills | Status: DC

## 2022-04-23 NOTE — Assessment & Plan Note (Signed)

## 2022-04-23 NOTE — Assessment & Plan Note (Signed)
Due for screening for mammogram, denies breast concerns, provided with phone number to call and schedule appointment for mammogram. Encouraged to repeat breast cancer screening every 1-2 years.  

## 2022-04-23 NOTE — Assessment & Plan Note (Signed)
Body mass index is 45.45 kg/m. Discussed importance of healthy weight management Discussed diet and exercise

## 2022-04-23 NOTE — Assessment & Plan Note (Signed)
Chronic, stable- goal of 130/80 Denies CP Denies SOB/ DOE Denies low blood pressure/hypotension Denies vision changes No LE Edema noted on exam Continue medication, Norvasc 10 mg, HCTZ 50 mg, Losartan 100 mg Denies side effects Seek emergent care if you develop chest pain or chest pressure

## 2022-04-23 NOTE — Assessment & Plan Note (Signed)
Due for DEXA, denies current complaints Can schedule with mammogram

## 2022-04-23 NOTE — Assessment & Plan Note (Signed)
Chronic, previous stable at 7.2% Repeat a1c Continue to recommend balanced, lower carb meals. Smaller meal size, adding snacks. Choosing water as drink of choice and increasing purposeful exercise. Continue glipizide 10 mg, continue metformin 7425 mg BID, off trulicity- now on ozempic at '1mg'$ /week- taken today On statin 40 mg On arb, cozaar '100mg'$  UTD on DM eye exam Foot exam completed today Urine micro completed previously this year

## 2022-04-23 NOTE — Assessment & Plan Note (Signed)
Discussed COVID booster UTD on all wellness concerns

## 2022-04-24 LAB — COMPREHENSIVE METABOLIC PANEL
ALT: 43 IU/L — ABNORMAL HIGH (ref 0–32)
AST: 24 IU/L (ref 0–40)
Albumin/Globulin Ratio: 1.7 (ref 1.2–2.2)
Albumin: 4.3 g/dL (ref 3.9–4.9)
Alkaline Phosphatase: 111 IU/L (ref 44–121)
BUN/Creatinine Ratio: 24 (ref 12–28)
BUN: 16 mg/dL (ref 8–27)
Bilirubin Total: 0.4 mg/dL (ref 0.0–1.2)
CO2: 23 mmol/L (ref 20–29)
Calcium: 11.3 mg/dL — ABNORMAL HIGH (ref 8.7–10.3)
Chloride: 100 mmol/L (ref 96–106)
Creatinine, Ser: 0.68 mg/dL (ref 0.57–1.00)
Globulin, Total: 2.5 g/dL (ref 1.5–4.5)
Glucose: 157 mg/dL — ABNORMAL HIGH (ref 70–99)
Potassium: 4.3 mmol/L (ref 3.5–5.2)
Sodium: 138 mmol/L (ref 134–144)
Total Protein: 6.8 g/dL (ref 6.0–8.5)
eGFR: 97 mL/min/{1.73_m2} (ref >59)

## 2022-04-24 LAB — CBC WITH DIFFERENTIAL/PLATELET
Basophils Absolute: 0 10*3/uL (ref 0.0–0.2)
Basos: 1 %
EOS (ABSOLUTE): 0.3 10*3/uL (ref 0.0–0.4)
Eos: 5 %
Hematocrit: 40.1 % (ref 34.0–46.6)
Hemoglobin: 13 g/dL (ref 11.1–15.9)
Immature Grans (Abs): 0 10*3/uL (ref 0.0–0.1)
Immature Granulocytes: 0 %
Lymphocytes Absolute: 1.8 10*3/uL (ref 0.7–3.1)
Lymphs: 31 %
MCH: 27.1 pg (ref 26.6–33.0)
MCHC: 32.4 g/dL (ref 31.5–35.7)
MCV: 84 fL (ref 79–97)
Monocytes Absolute: 0.5 10*3/uL (ref 0.1–0.9)
Monocytes: 9 %
Neutrophils Absolute: 3.2 10*3/uL (ref 1.4–7.0)
Neutrophils: 54 %
Platelets: 195 10*3/uL (ref 150–450)
RBC: 4.8 x10E6/uL (ref 3.77–5.28)
RDW: 14.3 % (ref 11.7–15.4)
WBC: 5.8 10*3/uL (ref 3.4–10.8)

## 2022-04-24 LAB — TSH+FREE T4
Free T4: 1.19 ng/dL (ref 0.82–1.77)
TSH: 2.9 u[IU]/mL (ref 0.450–4.500)

## 2022-04-24 LAB — HEMOGLOBIN A1C
Est. average glucose Bld gHb Est-mCnc: 192 mg/dL
Hgb A1c MFr Bld: 8.3 % — ABNORMAL HIGH (ref 4.8–5.6)

## 2022-04-24 LAB — LIPID PANEL
Chol/HDL Ratio: 1.9 ratio (ref 0.0–4.4)
Cholesterol, Total: 133 mg/dL (ref 100–199)
HDL: 69 mg/dL (ref >39)
LDL Chol Calc (NIH): 48 mg/dL (ref 0–99)
Triglycerides: 86 mg/dL (ref 0–149)
VLDL Cholesterol Cal: 16 mg/dL (ref 5–40)

## 2022-04-24 NOTE — Progress Notes (Signed)
Blood chemistry continues to show elevations in blood sugar, calcium and liver enzyme ALT.   A1c has increased, now 8.3%, Continue to recommend balanced, lower carb meals. Smaller meal size, adding snacks. Choosing water as drink of choice and increasing purposeful exercise. Hopefully the start of the ozempic will assist. Would luke to see you back in mid October for 3 month re-check.  Blood counts stable.  Thyroid stable.  Cholesterol improved from fat/triglycerides. Other numbers remain stable.   Please schedule your mammogram and bone density. Please call and schedule your mammogram:  [facility]  [place], [place],  [place]  [address] Get Driving Directions Main: [phone]  Sunday:Closed Monday:7:20 AM - 5:00 PM Tuesday:7:20 AM - 5:00 PM Wednesday:7:20 AM - 5:00 PM Thursday:7:20 AM - 5:00 PM Friday:7:20 AM - 4:30 PM Saturday:Closed  Gwyneth Sprout, [place] [address] [place], [place] [address] [phone] (phone) [phone] (fax) [place]

## 2022-05-02 ENCOUNTER — Telehealth: Payer: Self-pay

## 2022-05-02 NOTE — Progress Notes (Signed)
    Chronic Care Management Pharmacy Assistant   Name: GLYNNA FAILLA  MRN: [id number] DOB: 04-Dec-1955  Patient called to be reminded of her telephone appointment with [name], CPP 05/05/2022 @ 0830.  No answer, left message of appointment date, time and type of appointment (either telephone or in person). Left message to have all medications, supplements, blood pressure and/or blood sugar logs available during appointment and to return call if need to reschedule.  Star Rating Drug: Glipizide 10 mg last filled on 10/07/2021 for a 90-Day supply with [place] Losartan 100 mg last filled on 07/28/2021 for a 90-Day supply with [place] Metformin 500 mg last filled on 09/23/2021 for a 90-Day supply with [place] Pravastatin 40 mg last filled on 09/25/2021 for a 90-Day supply with [place]  Any gaps in medications fill history? Yes according to Epic patient hasn't gotten any recent refills on her medications  Care Gaps: Diabetic Eye Exam [place], [place] Pharmacist Assistant Phone: [phone]

## 2022-05-05 ENCOUNTER — Ambulatory Visit (INDEPENDENT_AMBULATORY_CARE_PROVIDER_SITE_OTHER): Payer: HMO

## 2022-05-05 ENCOUNTER — Telehealth: Payer: Self-pay

## 2022-05-05 DIAGNOSIS — E1169 Type 2 diabetes mellitus with other specified complication: Secondary | ICD-10-CM

## 2022-05-05 DIAGNOSIS — E11628 Type 2 diabetes mellitus with other skin complications: Secondary | ICD-10-CM

## 2022-05-05 MED ORDER — SEMAGLUTIDE (1 MG/DOSE) 4 MG/3ML ~~LOC~~ SOPN: 1.0000 mg | PEN_INJECTOR | SUBCUTANEOUS | Status: DC

## 2022-05-05 NOTE — Progress Notes (Signed)
Chronic Care Management Pharmacy Note  05/05/2022 Name:  Jodi Becker MRN:  [id number] DOB:  1956/07/13  Summary: Patient presents for CCM follow-up.   -Patient received first Ozempic shipment from [employer], was given instructions on administration at PCP follow-up. So far patient tolerating well and denies nausea. Has noticed some decrease in appetite.  -Discussed potential for utilizing Freestyle Libre 2, adherence packaging, patient prefers to defer at this time.  -Continue current medications to assess full impact of ozempic.  Recommendations/Changes made from today's visit: Continue current medications  Plan: CPP follow-up in one month  Subjective: Jodi Becker is an 66 y.o. year old female who is a primary patient of Gwyneth Sprout, [place].  The CCM team was consulted for assistance with disease management and care coordination needs.    Engaged with patient by telephone for follow up visit in response to provider referral for pharmacy case management and/or care coordination services.   Consent to Services:  The patient was given information about Chronic Care Management services, agreed to services, and gave verbal consent prior to initiation of services.  Please see initial visit note for detailed documentation.   Patient Care Team: Gwyneth Sprout, FNP as PCP - General (Family Medicine) Germaine Pomfret, [facility] (Pharmacist)  Recent office visits: 04/23/22: Patient presented to Tally Joe, [place] for follow-up.   01/21/2022 Tally Joe, [place] (PCP Office Visit) for Type II DM- No medication changes noted, Lab order placed, Patient to follow-up in 3 months     Recent consult visits: 10/30/2021 Yvetta Coder, DPM (Podiatry) for Nail Problem- No medication changes noted, No orders placed, No follow-up noted  Hospital visits: Admitted to the hospital on 08/06/2021 due to Colonoscopy with Propofol. Discharge date was 08/06/2021. Discharged from [facility].     Objective:  Lab Results  Component Value Date   CREATININE 0.68 04/23/2022   BUN 16 04/23/2022   EGFR 97 04/23/2022   GFRNONAA 77 09/17/2020   GFRAA 89 09/17/2020   NA 138 04/23/2022   K 4.3 04/23/2022   CALCIUM 11.3 (H) 04/23/2022   CO2 23 04/23/2022   GLUCOSE 157 (H) 04/23/2022    Lab Results  Component Value Date/Time   HGBA1C 8.3 (H) 04/23/2022 09:36 AM   HGBA1C 7.2 (A) 01/21/2022 08:44 AM   HGBA1C 7.4 (A) 10/22/2021 08:18 AM   HGBA1C 8.4 (H) 04/11/2021 08:44 AM   MICROALBUR 50 03/13/2017 08:38 AM   MICROALBUR 50 01/14/2016 08:36 AM    Last diabetic Eye exam:  Lab Results  Component Value Date/Time   HMDIABEYEEXA No Retinopathy 09/22/2019 12:00 AM    Last diabetic Foot exam: No results found for: "HMDIABFOOTEX"   Lab Results  Component Value Date   CHOL 133 04/23/2022   HDL 69 04/23/2022   LDLCALC 48 04/23/2022   TRIG 86 04/23/2022   CHOLHDL 1.9 04/23/2022       Latest Ref Rng & Units 04/23/2022    9:36 AM 10/22/2021    9:02 AM 04/11/2021    8:44 AM  Hepatic Function  Total Protein 6.0 - 8.5 g/dL 6.8  7.2  6.9   Albumin 3.9 - 4.9 g/dL 4.3  4.8  4.6   AST 0 - 40 IU/L 24  37  29   ALT 0 - 32 IU/L 43  58  45   Alk Phosphatase 44 - 121 IU/L 111  101  101   Total Bilirubin 0.0 - 1.2 mg/dL 0.4  0.4  0.4     Lab Results  Component Value Date/Time   TSH 2.900 04/23/2022 09:36 AM   TSH 1.060 09/17/2020 08:09 AM   FREET4 1.19 04/23/2022 09:36 AM       Latest Ref Rng & Units 04/23/2022    9:36 AM 09/17/2020    8:09 AM 08/25/2019   12:04 PM  CBC  WBC 3.4 - 10.8 x10E3/uL 5.8  6.0  4.7   Hemoglobin 11.1 - 15.9 g/dL 13.0  12.9  13.4   Hematocrit 34.0 - 46.6 % 40.1  40.7  42.1   Platelets 150 - 450 x10E3/uL 195  227  205     No results found for: "VD25OH"  Clinical ASCVD: No  The 10-year ASCVD risk score (Arnett DK, et al., 2019) is: 15.8%   Values used to calculate the score:     Age: 87 years     Sex: Female      Is Non-Hispanic African American: Yes     Diabetic: Yes     Tobacco smoker: No     Systolic Blood Pressure: 124 mmHg     Is BP treated: Yes     HDL Cholesterol: 69 mg/dL     Total Cholesterol: 133 mg/dL       04/23/2022    8:49 AM 08/21/2021   11:24 AM 01/03/2021    8:31 AM  Depression screen PHQ 2/9  Decreased Interest 0 0 3  Down, Depressed, Hopeless 0 0 0  PHQ - 2 Score 0 0 3  Altered sleeping 0 0 2  Tired, decreased energy 0 0 1  Change in appetite 0 0 0  Feeling bad or failure about yourself  0 0 0  Trouble concentrating 0 0 0  Moving slowly or fidgety/restless 0 0 0  Suicidal thoughts 0 0 0  PHQ-9 Score 0 0 6  Difficult doing work/chores Not difficult at all Not difficult at all Not difficult at all    Social History   Tobacco Use  Smoking Status Never  Smokeless Tobacco Never   BP Readings from Last 3 Encounters:  04/23/22 136/72  01/21/22 140/73  10/22/21 138/76   Pulse Readings from Last 3 Encounters:  04/23/22 72  01/21/22 72  10/22/21 76   Wt Readings from Last 3 Encounters:  04/23/22 273 lb 1.6 oz (123.9 kg)  01/21/22 269 lb 6.4 oz (122.2 kg)  10/22/21 267 lb 3.2 oz (121.2 kg)   BMI Readings from Last 3 Encounters:  04/23/22 45.45 kg/m  01/21/22 44.83 kg/m  10/22/21 44.46 kg/m    Assessment/Interventions: Review of patient past medical history, allergies, medications, health status, including review of consultants reports, laboratory and other test data, was performed as part of comprehensive evaluation and provision of chronic care management services.   SDOH:  (Social Determinants of Health) assessments and interventions performed: Yes   SDOH Screenings   Alcohol Screen: Low Risk  (04/23/2022)   Alcohol Screen    Last Alcohol Screening Score (AUDIT): 0  Depression (PHQ2-9): Low Risk  (04/23/2022)   Depression (PHQ2-9)    PHQ-2 Score: 0  Financial Resource Strain: High Risk (01/27/2022)   Overall Financial Resource Strain (CARDIA)     Difficulty of Paying Living Expenses: Very hard  Food Insecurity: No Food Insecurity (11/23/2018)   Hunger Vital Sign    Worried About Running Out of Food in the Last Year: Never true    Ran Out of Food in the Last Year: Never true  Housing: Not  on file  Physical Activity: Insufficiently Active (11/23/2018)   Exercise Vital Sign    Days of Exercise per Week: 3 days    Minutes of Exercise per Session: 30 min  Social Connections: Somewhat Isolated (11/23/2018)   Social Connection and Isolation Panel [NHANES]    Frequency of Communication with Friends and Family: More than three times a week    Frequency of Social Gatherings with Friends and Family: More than three times a week    Attends Religious Services: More than 4 times per year    Active Member of [employer] or Organizations: No    Attends Archivist Meetings: Never    Marital Status: Widowed  Stress: No Stress Concern Present (11/23/2018)   [place]    Feeling of Stress : Only a little  Tobacco Use: Low Risk  (04/23/2022)   Patient History    Smoking Tobacco Use: Never    Smokeless Tobacco Use: Never    Passive Exposure: Not on file  Transportation Needs: No Transportation Needs (01/27/2022)   PRAPARE - Transportation    Lack of Transportation (Medical): No    Lack of Transportation (Non-Medical): No    CCM Care Plan  Allergies  Allergen Reactions   Sulfa Antibiotics     Medications Reviewed Today     Reviewed by Germaine Pomfret, [facility] (Pharmacist) on 05/05/22 at [phone]  Med List Status: <None>   Medication Order Taking? Sig Documenting Provider Last Dose Status Informant  amLODipine (NORVASC) 10 MG tablet [id number] No Take 1 tablet (10 mg total) by mouth daily. Gwyneth Sprout, FNP Taking Active   aspirin 81 MG chewable tablet [id number] No Chew by mouth. [provider] Taking Active            Med Note Michaelle Birks, Ruxin Ransome A   Mon Jan 27, 2022  1:28 PM)    b complex vitamins capsule [id number] No Take 1 capsule by mouth daily. [provider] Taking Active   Biotin 5000 MCG TABS [id number] No Take 2 tablets by mouth. [provider] Taking Active   blood glucose meter kit and supplies [id number] No Dispense based on patient and insurance preference. Use up to four times daily as directed. (FOR ICD-10 E10.9, E11.9). Gwyneth Sprout, FNP Taking Active   Cholecalciferol (VITAMIN D3) 1000 UNITS CAPS [id number] No Take 2,000 Units by mouth daily. [provider] Taking Active            Med Note Michaelle Birks, Paelyn Smick A   Mon Jan 27, 2022  1:28 PM)    glipiZIDE (GLUCOTROL XL) 10 MG 24 hr tablet [id number]  Take 1 tablet (10 mg total) by mouth daily with breakfast. Gwyneth Sprout, FNP  Active   hydrochlorothiazide (HYDRODIURIL) 50 MG tablet [id number]  Take 1 tablet (50 mg total) by mouth daily. Gwyneth Sprout, FNP  Active   Lancets [facility] DELICA PLUS [id number]) MISC [id number] No Apply topically. [provider] Taking Active   loratadine (CLARITIN) 10 MG tablet [id number] No Take 1 tablet by mouth once daily Tally Joe T, FNP Taking Active   losartan (COZAAR) 100 MG tablet [id number] No Take 1 tablet (100 mg total) by mouth daily. Gwyneth Sprout, FNP Taking Active   Magnesium 100 MG TABS [id number] No Take 100 mg by mouth daily. [provider] Taking Active   metFORMIN (GLUCOPHAGE) 500 MG tablet [id number] No Take 2 tablets (1,000 mg total) by mouth 2 (two)  times daily. Gwyneth Sprout, FNP Taking Active   [employer] ULTRA test strip [id number] No USE TO CHECK BLOOD SUGAR UP TO 4 TIMES DAILY [provider] Taking Active   pravastatin (PRAVACHOL) 40 MG tablet [id number] No Take 1 tablet (40 mg total) by mouth daily. Gwyneth Sprout, FNP Taking Active   Semaglutide, 1 MG/DOSE, 4 MG/3ML [place] [facility] [id number] Yes Inject 1 mg as directed once a week. Patient receives via [employer] Patient Assistance through  Dec 2023 Tally Joe T, [place]  Active   vitamin C (ASCORBIC ACID) 500 MG tablet [id number] No Take 500 mg by mouth daily. 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 ([place]) 10/22/2021   Toenail fungus 10/22/2021   Need for shingles vaccine 10/22/2021   Leg pain, bilateral 08/21/2021   Colon cancer screening 07/22/2021   Fungal rash of torso 07/22/2021   Morbid obesity ([place]) 05/17/2020   T2DM (type 2 diabetes mellitus) ([place]) 11/13/2016   Allergic rhinitis 07/09/2015   Eustachian tube dysfunction 07/09/2015   Abnormal ECG 04/25/2015   History of colon cancer 72/62/0355   Diastolic dysfunction 97/41/6384   Generalized headache 04/25/2015   Cannot sleep 04/25/2015   MI (mitral incompetence) 04/25/2015   Hyperlipidemia associated with type 2 diabetes mellitus ([place]) 04/20/2015   Anterior tibial tendonitis 05/05/2014   Hypertension associated with diabetes ([place]) 05/24/2009    Immunization History  Administered Date(s) Administered   Influenza,inj,Quad PF,6+ Mos 07/09/2015   Influenza-Unspecified 07/22/2016, 07/27/2017   Moderna Covid-19 Vaccine Bivalent Booster 15yr & up 09/04/2021   Moderna Sars-Covid-2 Vaccination 11/01/2019, 11/29/2019, 09/05/2020   PNEUMOCOCCAL CONJUGATE-20 10/22/2021   Pneumococcal-Unspecified 12/05/2009   Tdap 10/22/2021   Zoster Recombinat (Shingrix) 10/28/2021, 03/22/2022   Zoster, Live 07/22/2016    Conditions to be addressed/monitored:  Hypertension, Hyperlipidemia, Diabetes, and  Allergic Rhinitis  Care Plan : General Pharmacy (Adult)  Updates made by FGermaine Pomfret RPH since 05/05/2022 12:00 AM     Problem: Hypertension, Hyperlipidemia, Diabetes, and Allergic Rhinitis   Priority: High     Long-Range Goal: Patient-Specific Goal   Start Date: 01/27/2022  Expected End Date: 01/28/2023  This Visit's Progress: On track  Recent Progress: On track  Priority: High  Note:   Current Barriers:  Unable to independently afford treatment regimen  Pharmacist Clinical Goal(s):  Patient will verbalize ability to afford treatment regimen through collaboration with PharmD and provider.   Interventions: 1:1 collaboration with PGwyneth Sprout FNP regarding development and update of comprehensive plan of care as evidenced by provider attestation and co-signature Inter-disciplinary care team collaboration (see longitudinal plan of care) Comprehensive medication review performed; medication list updated in electronic medical record  Hypertension (BP goal <130/80) -Controlled -Current treatment: Amlodipine 10 mg daily: Appropriate, Effective, Query Safe HCTZ 50 mg daily: Appropriate, Effective, Safe, Accessible Losartan 100 mg daily: Appropriate, Effective, Safe, Accessible  -Medications previously tried: Chlorthalidone  -Current home blood pressures:   Hyperlipidemia: (LDL goal < 70) -Controlled -Current treatment: Pravastatin 40 mg daily: Appropriate, Effective, Safe, Accessible  -Medications previously  tried: NA  -Recommended to continue current medication  Diabetes (A1c goal <7%) -Uncontrolled -Current medications: Ozempic 1 mg weekly:  Glipizide XL 10 mg daily: Appropriate, Query effective Metformin 500 mg 2 tablets twice daily: Appropriate, Query effective -Medications previously tried: Actos, Januvia, Tradjenta   -Current home glucose readings  Fasting  24-Jul 163  23-Jul 150  22-Jul 175  21-Jul 167  20-Jul 145  19-Jul 174  18-Jul 154  17-Jul  161  16-Jul 187  15-Jul 180  14-Jul 156  Average 165  -Primary goals are weight loss, limit overeating.  -Denies hypoglycemic/hyperglycemic symptoms -Current meal patterns: Struggles with portion controls  -Current exercise: Housework, working as a Technical brewer. Limited by leg pain.  -Patient received first Ozempic shipment from [employer], was given instructions on administration at PCP follow-up. So far patient tolerating well and denies nausea. Has noticed some decrease in appetite.  -Discussed potential for utilizing Freestyle Libre 2, adherence packaging, patient prefers to defer at this time.  -Continue current medications to assess full impact of ozempic.   Allergic Rhinitis (Goal: Minimize symptoms) -Controlled -Current treatment  Flonase 50 mcg/act: Appropriate, Effective, Safe, Accessible  Loratadine 10 mg daily: Appropriate, Effective, Safe, Accessible  -Medications previously tried: NA  -Still has some breakthrough symptoms, much better control.  -Recommended to continue current medication  Patient Goals/Self-Care Activities Patient will:  - check glucose daily before breakfast, document, and provide at future appointments check blood pressure weekly, document, and provide at future appointments  Follow Up Plan: Telephone follow up appointment with care management team member scheduled for:  06/09/2022 at 8:30 AM    Medication Assistance: Application for Ozempic  medication assistance program. in process.  Anticipated assistance start date TBD.  See plan of care for additional detail.  Compliance/Adherence/Medication fill history: Care Gaps: Tetanus/TDAP Zoster Vaccines Diabetic Eye Exam COVID-19 Booster 4 PNA Vaccine  Dexa Scan Diabetic Foot Exam- Patient recently saw Podiatry  Star-Rating Drugs: Glipizide 10 mg last filled on 03/05/22 for a 90-Day supply with [place] Trulicity 1.5 mg last filled on 03/05/22 for a 84-Day supply with [place] Metformin  500 mg last filled on 03/05/22 for a 90-Day supply with [place] Pravastatin 40 mg last filled on 03/05/22 for a 90-Day supply with [place] Losartan 100 mg last filled on 03/05/22 for a 90-Day supply with [place]  Patient's preferred pharmacy is:  [facility] [address] ([address]), [place]) [place] [address] Phone: [phone] Fax: [phone]  [facility] [place], [place] [address] Phone: [phone] Fax: [phone]  Uses pill box? Yes Pt endorses 100% compliance  We discussed: Current pharmacy is preferred with insurance plan and patient is satisfied with pharmacy services Patient decided to: Continue current medication management strategy  Care Plan and Follow Up Patient Decision:  Patient agrees to Care Plan and Follow-up.  Plan: Telephone follow up appointment with care management team member scheduled for:  06/09/2022 at 8:30 AM  [name], PharmD, [place], [place] [phone]

## 2022-05-05 NOTE — Progress Notes (Signed)
    Chronic Care Management Pharmacy Assistant   Name: Jodi Becker  MRN: [id number] DOB: Apr 09, 1956  Reason for Encounter: Patient Assistance Application follow-up for Ozempic through [employer]   I received a message requesting that I contact [employer] to check the status of the patient's application for her Ozempic.  I contacted [employer] and the representative advised patient's first shipment went out on 07/05 and someone at [facility] by the last name of black signed for the medication on 07/06. Patient's next shipment is scheduled for 07/05/2022 and expiration date of the patient's application is 73/54/3014. We can restart the renewal process on or after 07/27/2022.  CPP has been notified.  Medications: Outpatient Encounter Medications as of 05/05/2022  Medication Sig   amLODipine (NORVASC) 10 MG tablet Take 1 tablet (10 mg total) by mouth daily.   aspirin 81 MG chewable tablet Chew by mouth.   b complex vitamins capsule Take 1 capsule by mouth daily.   Biotin 5000 MCG TABS Take 2 tablets by mouth.   blood glucose meter kit and supplies Dispense based on patient and insurance preference. Use up to four times daily as directed. (FOR ICD-10 E10.9, E11.9).   Cholecalciferol (VITAMIN D3) 1000 UNITS CAPS Take 2,000 Units by mouth daily.   glipiZIDE (GLUCOTROL XL) 10 MG 24 hr tablet Take 1 tablet (10 mg total) by mouth daily with breakfast.   hydrochlorothiazide (HYDRODIURIL) 50 MG tablet Take 1 tablet (50 mg total) by mouth daily.   Lancets (ONETOUCH DELICA PLUS [id number]) MISC Apply topically.   loratadine (CLARITIN) 10 MG tablet Take 1 tablet by mouth once daily   losartan (COZAAR) 100 MG tablet Take 1 tablet (100 mg total) by mouth daily.   Magnesium 100 MG TABS Take 100 mg by mouth daily.   metFORMIN (GLUCOPHAGE) 500 MG tablet Take 2 tablets (1,000 mg total) by mouth 2 (two) times daily.   ONETOUCH ULTRA test strip USE TO CHECK BLOOD SUGAR UP TO 4 TIMES DAILY   pravastatin  (PRAVACHOL) 40 MG tablet Take 1 tablet (40 mg total) by mouth daily.   vitamin C (ASCORBIC ACID) 500 MG tablet Take 500 mg by mouth daily.   No facility-administered encounter medications on file as of 05/05/2022.    Lynann Bologna, CPA/CMA Clinical Pharmacist Assistant Phone: [phone]

## 2022-05-05 NOTE — Patient Instructions (Signed)
Visit Information It was great speaking with you today!  Please let me know if you have any questions about our visit.   Goals Addressed             This Visit's Progress    Monitor and Manage My Blood Sugar-Diabetes Type 2   On track    Timeframe:  Long-Range Goal Priority:  High Start Date: 01/27/22                            Expected End Date: 01/28/23                      Follow Up within 30 days   - check blood sugar daily before breakfast - check blood sugar if I feel it is too high or too low - enter blood sugar readings and medication or insulin into daily log - take the blood sugar log to all doctor visits    Why is this important?   Checking your blood sugar at home helps to keep it from getting very high or very low.  Writing the results in a diary or log helps the doctor know how to care for you.  Your blood sugar log should have the time, date and the results.  Also, write down the amount of insulin or other medicine that you take.  Other information, like what you ate, exercise done and how you were feeling, will also be helpful.     Notes:         Patient Care Plan: General Pharmacy (Adult)     Problem Identified: Hypertension, Hyperlipidemia, Diabetes, and Allergic Rhinitis   Priority: High     Long-Range Goal: Patient-Specific Goal   Start Date: 01/27/2022  Expected End Date: 01/28/2023  This Visit's Progress: On track  Recent Progress: On track  Priority: High  Note:   Current Barriers:  Unable to independently afford treatment regimen  Pharmacist Clinical Goal(s):  Patient will verbalize ability to afford treatment regimen through collaboration with PharmD and provider.   Interventions: 1:1 collaboration with Gwyneth Sprout, FNP regarding development and update of comprehensive plan of care as evidenced by provider attestation and co-signature Inter-disciplinary care team collaboration (see longitudinal plan of care) Comprehensive medication  review performed; medication list updated in electronic medical record  Hypertension (BP goal <130/80) -Controlled -Current treatment: Amlodipine 10 mg daily: Appropriate, Effective, Query Safe HCTZ 50 mg daily: Appropriate, Effective, Safe, Accessible Losartan 100 mg daily: Appropriate, Effective, Safe, Accessible  -Medications previously tried: Chlorthalidone  -Current home blood pressures:   Hyperlipidemia: (LDL goal < 70) -Controlled -Current treatment: Pravastatin 40 mg daily: Appropriate, Effective, Safe, Accessible  -Medications previously tried: NA  -Recommended to continue current medication  Diabetes (A1c goal <7%) -Uncontrolled -Current medications: Ozempic 1 mg weekly:  Glipizide XL 10 mg daily: Appropriate, Query effective Metformin 500 mg 2 tablets twice daily: Appropriate, Query effective -Medications previously tried: Actos, Januvia, Tradjenta   -Current home glucose readings  Fasting  24-Jul 163  23-Jul 150  22-Jul 175  21-Jul 167  20-Jul 145  19-Jul 174  18-Jul 154  17-Jul 161  16-Jul 187  15-Jul 180  14-Jul 156  Average 165  -Primary goals are weight loss, limit overeating.  -Denies hypoglycemic/hyperglycemic symptoms -Current meal patterns: Struggles with portion controls  -Current exercise: Housework, working as a Technical brewer. Limited by leg pain.  -Patient received first Ozempic shipment from [employer], was given  instructions on administration at PCP follow-up. So far patient tolerating well and denies nausea. Has noticed some decrease in appetite.  -Discussed potential for utilizing Freestyle Libre 2, adherence packaging, patient prefers to defer at this time.  -Continue current medications to assess full impact of ozempic.   Allergic Rhinitis (Goal: Minimize symptoms) -Controlled -Current treatment  Flonase 50 mcg/act: Appropriate, Effective, Safe, Accessible  Loratadine 10 mg daily: Appropriate, Effective, Safe, Accessible  -Medications  previously tried: NA  -Still has some breakthrough symptoms, much better control.  -Recommended to continue current medication  Patient Goals/Self-Care Activities Patient will:  - check glucose daily before breakfast, document, and provide at future appointments check blood pressure weekly, document, and provide at future appointments  Follow Up Plan: Telephone follow up appointment with care management team member scheduled for:  06/09/2022 at 8:30 AM      Patient agreed to services and verbal consent obtained.   Patient verbalizes understanding of instructions and care plan provided today and agrees to view in [place]. Active MyChart status and patient understanding of how to access instructions and care plan via MyChart confirmed with patient.     [name], PharmD, [place], CPP  Clinical Pharmacist Practitioner  [facility] [phone]

## 2022-05-12 DIAGNOSIS — E11628 Type 2 diabetes mellitus with other skin complications: Secondary | ICD-10-CM

## 2022-05-12 DIAGNOSIS — E1169 Type 2 diabetes mellitus with other specified complication: Secondary | ICD-10-CM

## 2022-05-12 DIAGNOSIS — L84 Corns and callosities: Secondary | ICD-10-CM

## 2022-05-12 DIAGNOSIS — E785 Hyperlipidemia, unspecified: Secondary | ICD-10-CM | POA: Diagnosis not present

## 2022-05-31 ENCOUNTER — Other Ambulatory Visit: Payer: Self-pay | Admitting: Family Medicine

## 2022-06-06 ENCOUNTER — Telehealth: Payer: Self-pay

## 2022-06-06 NOTE — Progress Notes (Signed)
    Chronic Care Management Pharmacy Assistant   Name: Jodi Becker  MRN: [id number] DOB: [birth year]  Patient called to be reminded of her telephone appointment with [name], CPP on 06/09/2022 @ 0830.  Patient aware of appointment date, time, and type of appointment (either telephone or in person). Patient aware to have/bring all medications, supplements, blood pressure and/or blood sugar logs to visit.  Star Rating Drug: Glipizide 10 mg last filled on 05/29/2022 for a 90-Day supply with [place] Losartan 100 mg last filled on 04/15/2022 for a 90-Day supply with [place] Metformin 500 mg last filled on 03/14/2022 for a 90-Day supply with [place] Pravastatin 40 mg last filled on 03/18/2022 for a 90-Day supply with [place] 4 mg patient is on patient assistance for this medication with [employer].  Any gaps in medications fill history? None  Care Gaps: Diabetic Eye Exam Influenza Vaccine Dexa Scan  Lynann Bologna, CPA/CMA Clinical Pharmacist Assistant Phone: [phone]

## 2022-06-09 ENCOUNTER — Ambulatory Visit (INDEPENDENT_AMBULATORY_CARE_PROVIDER_SITE_OTHER): Payer: HMO

## 2022-06-09 DIAGNOSIS — E1165 Type 2 diabetes mellitus with hyperglycemia: Secondary | ICD-10-CM

## 2022-06-09 DIAGNOSIS — E1169 Type 2 diabetes mellitus with other specified complication: Secondary | ICD-10-CM

## 2022-06-09 NOTE — Progress Notes (Unsigned)
Chronic Care Management Pharmacy Note  06/12/2022 Name:  Jodi Becker MRN:  [id number] DOB:  September 05, 1956  Summary: Patient presents for CCM follow-up.   -Patient received first Ozempic shipment from [employer], was given instructions on administration at PCP follow-up. So far patient tolerating well and denies nausea. Has noticed some decrease in appetite.  -Discussed potential for utilizing Freestyle Libre 2, adherence packaging, patient prefers to defer at this time.  -Continue current medications to assess full impact of ozempic.  Recommendations/Changes made from today's visit: Continue current medications  Plan: CPP follow-up in one month  Subjective: Jodi Becker is an 66 y.o. year old female who is a primary patient of Jacky Kindle, [place].  The CCM team was consulted for assistance with disease management and care coordination needs.    Engaged with patient by telephone for follow up visit in response to provider referral for pharmacy case management and/or care coordination services.   Consent to Services:  The patient was given information about Chronic Care Management services, agreed to services, and gave verbal consent prior to initiation of services.  Please see initial visit note for detailed documentation.   Patient Care Team: Jacky Kindle, FNP as PCP - General (Family Medicine) Gaspar Cola, [facility] (Pharmacist)  Recent office visits: 04/23/22: Patient presented to Merita Norton, FNP for follow-up.   01/21/2022 Merita Norton, FNP (PCP Office Visit) for Type II DM- No medication changes noted, Lab order placed, Patient to follow-up in 3 months     Recent consult visits: 10/30/2021 Arnold Long, DPM (Podiatry) for Nail Problem- No medication changes noted, No orders placed, No follow-up noted  Hospital visits: Admitted to the hospital on 08/06/2021 due to Colonoscopy with Propofol. Discharge date was 08/06/2021. Discharged from [facility].     Objective:  Lab Results  Component Value Date   CREATININE 0.68 04/23/2022   BUN 16 04/23/2022   EGFR 97 04/23/2022   GFRNONAA 77 09/17/2020   GFRAA 89 09/17/2020   NA 138 04/23/2022   K 4.3 04/23/2022   CALCIUM 11.3 (H) 04/23/2022   CO2 23 04/23/2022   GLUCOSE 157 (H) 04/23/2022    Lab Results  Component Value Date/Time   HGBA1C 8.3 (H) 04/23/2022 09:36 AM   HGBA1C 7.2 (A) 01/21/2022 08:44 AM   HGBA1C 7.4 (A) 10/22/2021 08:18 AM   HGBA1C 8.4 (H) 04/11/2021 08:44 AM   MICROALBUR 50 03/13/2017 08:38 AM   MICROALBUR 50 01/14/2016 08:36 AM    Last diabetic Eye exam:  Lab Results  Component Value Date/Time   HMDIABEYEEXA No Retinopathy 09/22/2019 12:00 AM    Last diabetic Foot exam: No results found for: "HMDIABFOOTEX"   Lab Results  Component Value Date   CHOL 133 04/23/2022   HDL 69 04/23/2022   LDLCALC 48 04/23/2022   TRIG 86 04/23/2022   CHOLHDL 1.9 04/23/2022       Latest Ref Rng & Units 04/23/2022    9:36 AM 10/22/2021    9:02 AM 04/11/2021    8:44 AM  Hepatic Function  Total Protein 6.0 - 8.5 g/dL 6.8  7.2  6.9   Albumin 3.9 - 4.9 g/dL 4.3  4.8  4.6   AST 0 - 40 IU/L 24  37  29   ALT 0 - 32 IU/L 43  58  45   Alk Phosphatase 44 - 121 IU/L 111  101  101   Total Bilirubin 0.0 - 1.2 mg/dL 0.4  0.4  0.4     Lab Results  Component Value Date/Time   TSH 2.900 04/23/2022 09:36 AM   TSH 1.060 09/17/2020 08:09 AM   FREET4 1.19 04/23/2022 09:36 AM       Latest Ref Rng & Units 04/23/2022    9:36 AM 09/17/2020    8:09 AM 08/25/2019   12:04 PM  CBC  WBC 3.4 - 10.8 x10E3/uL 5.8  6.0  4.7   Hemoglobin 11.1 - 15.9 g/dL 13.0  12.9  13.4   Hematocrit 34.0 - 46.6 % 40.1  40.7  42.1   Platelets 150 - 450 x10E3/uL 195  227  205     No results found for: "VD25OH"  Clinical ASCVD: No  The 10-year ASCVD risk score (Arnett DK, et al., 2019) is: 16.8%   Values used to calculate the score:     Age: 89 years     Sex: Female      Is Non-Hispanic African American: Yes     Diabetic: Yes     Tobacco smoker: No     Systolic Blood Pressure: 161 mmHg     Is BP treated: Yes     HDL Cholesterol: 69 mg/dL     Total Cholesterol: 133 mg/dL       04/23/2022    8:49 AM 08/21/2021   11:24 AM 01/03/2021    8:31 AM  Depression screen PHQ 2/9  Decreased Interest 0 0 3  Down, Depressed, Hopeless 0 0 0  PHQ - 2 Score 0 0 3  Altered sleeping 0 0 2  Tired, decreased energy 0 0 1  Change in appetite 0 0 0  Feeling bad or failure about yourself  0 0 0  Trouble concentrating 0 0 0  Moving slowly or fidgety/restless 0 0 0  Suicidal thoughts 0 0 0  PHQ-9 Score 0 0 6  Difficult doing work/chores Not difficult at all Not difficult at all Not difficult at all    Social History   Tobacco Use  Smoking Status Never  Smokeless Tobacco Never   BP Readings from Last 3 Encounters:  04/23/22 136/72  01/21/22 140/73  10/22/21 138/76   Pulse Readings from Last 3 Encounters:  04/23/22 72  01/21/22 72  10/22/21 76   Wt Readings from Last 3 Encounters:  04/23/22 273 lb 1.6 oz (123.9 kg)  01/21/22 269 lb 6.4 oz (122.2 kg)  10/22/21 267 lb 3.2 oz (121.2 kg)   BMI Readings from Last 3 Encounters:  04/23/22 45.45 kg/m  01/21/22 44.83 kg/m  10/22/21 44.46 kg/m    Assessment/Interventions: Review of patient past medical history, allergies, medications, health status, including review of consultants reports, laboratory and other test data, was performed as part of comprehensive evaluation and provision of chronic care management services.   SDOH:  (Social Determinants of Health) assessments and interventions performed: Yes   SDOH Screenings   Alcohol Screen: Low Risk  (04/23/2022)   Alcohol Screen    Last Alcohol Screening Score (AUDIT): 0  Depression (PHQ2-9): Low Risk  (04/23/2022)   Depression (PHQ2-9)    PHQ-2 Score: 0  Financial Resource Strain: High Risk (01/27/2022)   Overall Financial Resource Strain (CARDIA)     Difficulty of Paying Living Expenses: Very hard  Food Insecurity: No Food Insecurity (11/23/2018)   Hunger Vital Sign    Worried About Running Out of Food in the Last Year: Never true    Ran Out of Food in the Last Year: Never true  Housing: Not  on file  Physical Activity: Insufficiently Active (11/23/2018)   Exercise Vital Sign    Days of Exercise per Week: 3 days    Minutes of Exercise per Session: 30 min  Social Connections: Somewhat Isolated (11/23/2018)   Social Connection and Isolation Panel [NHANES]    Frequency of Communication with Friends and Family: More than three times a week    Frequency of Social Gatherings with Friends and Family: More than three times a week    Attends Religious Services: More than 4 times per year    Active Member of [employer] or Organizations: No    Attends Archivist Meetings: Never    Marital Status: Widowed  Stress: No Stress Concern Present (11/23/2018)   [place]    Feeling of Stress : Only a little  Tobacco Use: Low Risk  (04/23/2022)   Patient History    Smoking Tobacco Use: Never    Smokeless Tobacco Use: Never    Passive Exposure: Not on file  Transportation Needs: No Transportation Needs (01/27/2022)   PRAPARE - Transportation    Lack of Transportation (Medical): No    Lack of Transportation (Non-Medical): No    CCM Care Plan  Allergies  Allergen Reactions   Sulfa Antibiotics     Medications Reviewed Today     Reviewed by Germaine Pomfret, [facility] (Pharmacist) on 05/05/22 at [phone]  Med List Status: <None>   Medication Order Taking? Sig Documenting Provider Last Dose Status Informant  amLODipine (NORVASC) 10 MG tablet [id number] No Take 1 tablet (10 mg total) by mouth daily. Gwyneth Sprout, FNP Taking Active   aspirin 81 MG chewable tablet [id number] No Chew by mouth. [provider] Taking Active            Med Note Michaelle Birks, Raahi Korber A   Mon Jan 27, 2022  1:28 PM)    b complex vitamins capsule [id number] No Take 1 capsule by mouth daily. [provider] Taking Active   Biotin 5000 MCG TABS [id number] No Take 2 tablets by mouth. [provider] Taking Active   blood glucose meter kit and supplies [id number] No Dispense based on patient and insurance preference. Use up to four times daily as directed. (FOR ICD-10 E10.9, E11.9). Gwyneth Sprout, FNP Taking Active   Cholecalciferol (VITAMIN D3) 1000 UNITS CAPS [id number] No Take 2,000 Units by mouth daily. [provider] Taking Active            Med Note Michaelle Birks, Channell Quattrone A   Mon Jan 27, 2022  1:28 PM)    glipiZIDE (GLUCOTROL XL) 10 MG 24 hr tablet [id number]  Take 1 tablet (10 mg total) by mouth daily with breakfast. Gwyneth Sprout, FNP  Active   hydrochlorothiazide (HYDRODIURIL) 50 MG tablet [id number]  Take 1 tablet (50 mg total) by mouth daily. Gwyneth Sprout, FNP  Active   Lancets [facility] DELICA PLUS [id number]) MISC [id number] No Apply topically. [provider] Taking Active   loratadine (CLARITIN) 10 MG tablet [id number] No Take 1 tablet by mouth once daily Tally Joe T, FNP Taking Active   losartan (COZAAR) 100 MG tablet [id number] No Take 1 tablet (100 mg total) by mouth daily. Gwyneth Sprout, FNP Taking Active   Magnesium 100 MG TABS [id number] No Take 100 mg by mouth daily. [provider] Taking Active   metFORMIN (GLUCOPHAGE) 500 MG tablet [id number] No Take 2 tablets (1,000 mg total) by mouth 2 (two)  times daily. Gwyneth Sprout, FNP Taking Active   [facility] ULTRA test strip [id number] No USE TO CHECK BLOOD SUGAR UP TO 4 TIMES DAILY [provider] Taking Active   pravastatin (PRAVACHOL) 40 MG tablet [id number] No Take 1 tablet (40 mg total) by mouth daily. Gwyneth Sprout, FNP Taking Active   Semaglutide, 1 MG/DOSE, 4 MG/3ML [place] [facility] [id number] Yes Inject 1 mg as directed once a week. Patient receives via [employer] Patient Assistance through  Dec 2023 Tally Joe T, [place]  Active   vitamin C (ASCORBIC ACID) 500 MG tablet [id number] No Take 500 mg by mouth daily. 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 ([place]) 10/22/2021   Toenail fungus 10/22/2021   Need for shingles vaccine 10/22/2021   Leg pain, bilateral 08/21/2021   Colon cancer screening 07/22/2021   Fungal rash of torso 07/22/2021   Morbid obesity ([place]) 05/17/2020   T2DM (type 2 diabetes mellitus) ([place]) 11/13/2016   Allergic rhinitis 07/09/2015   Eustachian tube dysfunction 07/09/2015   Abnormal ECG 04/25/2015   History of colon cancer 10/93/2355   Diastolic dysfunction 73/22/0254   Generalized headache 04/25/2015   Cannot sleep 04/25/2015   MI (mitral incompetence) 04/25/2015   Hyperlipidemia associated with type 2 diabetes mellitus ([place]) 04/20/2015   Anterior tibial tendonitis 05/05/2014   Hypertension associated with diabetes ([place]) 05/24/2009    Immunization History  Administered Date(s) Administered   Influenza,inj,Quad PF,6+ Mos 07/09/2015   Influenza-Unspecified 07/22/2016, 07/27/2017   Moderna Covid-19 Vaccine Bivalent Booster 82yrs & up 09/04/2021   Moderna Sars-Covid-2 Vaccination 11/01/2019, 11/29/2019, 09/05/2020   PNEUMOCOCCAL CONJUGATE-20 10/22/2021   Pneumococcal-Unspecified 12/05/2009   Tdap 10/22/2021   Zoster Recombinat (Shingrix) 10/28/2021, 03/22/2022   Zoster, Live 07/22/2016    Conditions to be addressed/monitored:  Hypertension, Hyperlipidemia, Diabetes, and  Allergic Rhinitis  Care Plan : General Pharmacy (Adult)  Updates made by Germaine Pomfret, RPH since 06/12/2022 12:00 AM     Problem: Hypertension, Hyperlipidemia, Diabetes, and Allergic Rhinitis   Priority: High     Long-Range Goal: Patient-Specific Goal   Start Date: 01/27/2022  Expected End Date: 01/28/2023  This Visit's Progress: On track  Recent Progress: On track  Priority: High  Note:   Current Barriers:  Unable to independently afford treatment regimen  Pharmacist Clinical Goal(s):  Patient will verbalize ability to afford treatment regimen through collaboration with PharmD and provider.   Interventions: 1:1 collaboration with Gwyneth Sprout, FNP regarding development and update of comprehensive plan of care as evidenced by provider attestation and co-signature Inter-disciplinary care team collaboration (see longitudinal plan of care) Comprehensive medication review performed; medication list updated in electronic medical record  Hypertension (BP goal <130/80) -Controlled -Current treatment: Amlodipine 10 mg daily: Appropriate, Effective, Query Safe HCTZ 50 mg daily: Appropriate, Effective, Safe, Accessible Losartan 100 mg daily: Appropriate, Effective, Safe, Accessible  -Medications previously tried: Chlorthalidone  -Current home blood pressures: 115/69 pulse 71  -Continue current medications   Hyperlipidemia: (LDL goal < 70) -Controlled -Current treatment: Pravastatin 40 mg daily:  Appropriate, Effective, Safe, Accessible  -Medications previously tried: NA  -Recommended to continue current medication  Diabetes (A1c goal <7%) -Uncontrolled -Current medications: Ozempic 1 mg weekly:  Glipizide XL 10 mg daily: Appropriate, Query effective Metformin 500 mg 2 tablets twice daily: Appropriate, Query effective -Medications previously tried: Actos, Januvia, Tradjenta   -Current home glucose readings 27-Aug 128  26-Aug 169  25-Aug 163  24-Aug 179  23-Aug 179   22-Aug 149  21-Aug 172  20-Aug 186  19-Aug 162  18-Aug 158  Average 165   -Primary goals are weight loss, limit overeating.  -Denies hypoglycemic/hyperglycemic symptoms -Current meal patterns: Struggles with portion controls  -Current exercise: Housework, working as a Technical brewer. Limited by leg pain.  -Continue current medications to assess full impact of ozempic.   Allergic Rhinitis (Goal: Minimize symptoms) -Controlled -Current treatment  Flonase 50 mcg/act: Appropriate, Effective, Safe, Accessible  Loratadine 10 mg daily: Appropriate, Effective, Safe, Accessible  -Medications previously tried: NA  -Still has some breakthrough symptoms, much better control.  -Recommended to continue current medication  Patient Goals/Self-Care Activities Patient will:  - check glucose daily before breakfast, document, and provide at future appointments check blood pressure weekly, document, and provide at future appointments  Follow Up Plan: Telephone follow up appointment with care management team member scheduled for:  09/01/2022 at 8:30 AM     Medication Assistance:  Ozempic obtained through [employer] medication assistance program.  Enrollment ends Dec 2023  Compliance/Adherence/Medication fill history: Care Gaps: Tetanus/TDAP Zoster Vaccines Diabetic Eye Exam COVID-19 Booster 4 PNA Vaccine  Dexa Scan Diabetic Foot Exam- Patient recently saw Podiatry  Star-Rating Drugs: Glipizide 10 mg last filled on 03/05/22 for a 90-Day supply with [place] Trulicity 1.5 mg last filled on 03/05/22 for a 84-Day supply with [place] Metformin 500 mg last filled on 03/05/22 for a 90-Day supply with [place] Pravastatin 40 mg last filled on 03/05/22 for a 90-Day supply with [place] Losartan 100 mg last filled on 03/05/22 for a 90-Day supply with [place]  Patient's preferred pharmacy is:  [facility] [address]), [place])  [address] Phone: [phone] Fax: [phone]  [facility] [place], [place] [address] Phone: [phone] Fax: [phone]  Uses pill box? Yes Pt endorses 100% compliance  We discussed: Current pharmacy is preferred with insurance plan and patient is satisfied with pharmacy services Patient decided to: Continue current medication management strategy  Care Plan and Follow Up Patient Decision:  Patient agrees to Care Plan and Follow-up.  Plan: Telephone follow up appointment with care management team member scheduled for:  09/01/2022 at 8:30 AM  [name], PharmD, Para [place], [place] [phone]

## 2022-06-12 DIAGNOSIS — I10 Essential (primary) hypertension: Secondary | ICD-10-CM | POA: Diagnosis not present

## 2022-06-12 DIAGNOSIS — E1169 Type 2 diabetes mellitus with other specified complication: Secondary | ICD-10-CM

## 2022-06-12 DIAGNOSIS — E785 Hyperlipidemia, unspecified: Secondary | ICD-10-CM

## 2022-06-12 DIAGNOSIS — Z7984 Long term (current) use of oral hypoglycemic drugs: Secondary | ICD-10-CM

## 2022-06-12 DIAGNOSIS — Z7985 Long-term (current) use of injectable non-insulin antidiabetic drugs: Secondary | ICD-10-CM | POA: Diagnosis not present

## 2022-06-12 NOTE — Patient Instructions (Signed)
Visit Information It was great speaking with you today!  Please let me know if you have any questions about our visit.   Goals Addressed             This Visit's Progress    Monitor and Manage My Blood Sugar-Diabetes Type 2   On track    Timeframe:  Long-Range Goal Priority:  High Start Date: 01/27/22                            Expected End Date: 01/28/23                      Follow Up within 30 days   - check blood sugar daily before breakfast - check blood sugar if I feel it is too high or too low - enter blood sugar readings and medication or insulin into daily log - take the blood sugar log to all doctor visits    Why is this important?   Checking your blood sugar at home helps to keep it from getting very high or very low.  Writing the results in a diary or log helps the doctor know how to care for you.  Your blood sugar log should have the time, date and the results.  Also, write down the amount of insulin or other medicine that you take.  Other information, like what you ate, exercise done and how you were feeling, will also be helpful.     Notes:         Patient Care Plan: General Pharmacy (Adult)     Problem Identified: Hypertension, Hyperlipidemia, Diabetes, and Allergic Rhinitis   Priority: High     Long-Range Goal: Patient-Specific Goal   Start Date: 01/27/2022  Expected End Date: 01/28/2023  This Visit's Progress: On track  Recent Progress: On track  Priority: High  Note:   Current Barriers:  Unable to independently afford treatment regimen  Pharmacist Clinical Goal(s):  Patient will verbalize ability to afford treatment regimen through collaboration with PharmD and provider.   Interventions: 1:1 collaboration with Gwyneth Sprout, FNP regarding development and update of comprehensive plan of care as evidenced by provider attestation and co-signature Inter-disciplinary care team collaboration (see longitudinal plan of care) Comprehensive medication  review performed; medication list updated in electronic medical record  Hypertension (BP goal <130/80) -Controlled -Current treatment: Amlodipine 10 mg daily: Appropriate, Effective, Query Safe HCTZ 50 mg daily: Appropriate, Effective, Safe, Accessible Losartan 100 mg daily: Appropriate, Effective, Safe, Accessible  -Medications previously tried: Chlorthalidone  -Current home blood pressures: 115/69 pulse 71  -Continue current medications   Hyperlipidemia: (LDL goal < 70) -Controlled -Current treatment: Pravastatin 40 mg daily: Appropriate, Effective, Safe, Accessible  -Medications previously tried: NA  -Recommended to continue current medication  Diabetes (A1c goal <7%) -Uncontrolled -Current medications: Ozempic 1 mg weekly:  Glipizide XL 10 mg daily: Appropriate, Query effective Metformin 500 mg 2 tablets twice daily: Appropriate, Query effective -Medications previously tried: Actos, Januvia, Tradjenta   -Current home glucose readings 27-Aug 128  26-Aug 169  25-Aug 163  24-Aug 179  23-Aug 179  22-Aug 149  21-Aug 172  20-Aug 186  19-Aug 162  18-Aug 158  Average 165   -Primary goals are weight loss, limit overeating.  -Denies hypoglycemic/hyperglycemic symptoms -Current meal patterns: Struggles with portion controls  -Current exercise: Housework, working as a Technical [employer]. Limited by leg pain.  -Continue current medications to assess full impact of  ozempic.   Allergic Rhinitis (Goal: Minimize symptoms) -Controlled -Current treatment  Flonase 50 mcg/act: Appropriate, Effective, Safe, Accessible  Loratadine 10 mg daily: Appropriate, Effective, Safe, Accessible  -Medications previously tried: NA  -Still has some breakthrough symptoms, much better control.  -Recommended to continue current medication  Patient Goals/Self-Care Activities Patient will:  - check glucose daily before breakfast, document, and provide at future appointments check blood pressure weekly, document,  and provide at future appointments  Follow Up Plan: Telephone follow up appointment with care management team member scheduled for:  09/01/2022 at 8:30 AM    Patient agreed to services and verbal consent obtained.   Patient verbalizes understanding of instructions and care plan provided today and agrees to view in [place]. Active MyChart status and patient understanding of how to access instructions and care plan via MyChart confirmed with patient.     [name], PharmD, Para [place], CPP  Clinical Pharmacist Practitioner  [facility] [phone]

## 2022-06-21 ENCOUNTER — Other Ambulatory Visit: Payer: Self-pay | Admitting: Family Medicine

## 2022-06-30 ENCOUNTER — Ambulatory Visit
Admission: RE | Admit: 2022-06-30 | Discharge: 2022-06-30 | Disposition: A | Discharge status: Home / Self Care | Payer: HMO | Source: Ambulatory Visit | Attending: Family Medicine | Admitting: Family Medicine

## 2022-06-30 DIAGNOSIS — Z13820 Encounter for screening for osteoporosis: Secondary | ICD-10-CM | POA: Insufficient documentation

## 2022-06-30 DIAGNOSIS — Z780 Asymptomatic menopausal state: Secondary | ICD-10-CM | POA: Insufficient documentation

## 2022-06-30 DIAGNOSIS — E119 Type 2 diabetes mellitus without complications: Secondary | ICD-10-CM | POA: Diagnosis not present

## 2022-06-30 DIAGNOSIS — Z1231 Encounter for screening mammogram for malignant neoplasm of breast: Secondary | ICD-10-CM | POA: Insufficient documentation

## 2022-06-30 NOTE — Progress Notes (Signed)
Normal bone strength noted. Can use Vit D 800 IU and Calcium 1200 mg to assist regular exercise for bone health. Can repeat DEXA in 3-5 years if desired.  Please let us know if you have any questions.  Thank you,  Tally Joe, FNP

## 2022-07-02 NOTE — Progress Notes (Signed)
Hi Sharrell  Normal mammogram; repeat in 1 year.  Please let us know if you have any questions.  Thank you,  Tally Joe, FNP

## 2022-07-18 ENCOUNTER — Telehealth: Payer: Self-pay

## 2022-07-18 ENCOUNTER — Other Ambulatory Visit: Payer: Self-pay | Admitting: Family Medicine

## 2022-07-18 DIAGNOSIS — E7800 Pure hypercholesterolemia, unspecified: Secondary | ICD-10-CM

## 2022-07-18 NOTE — Progress Notes (Signed)
Chronic Care Management Pharmacy Assistant   Name: Jodi Becker  MRN: [id number] DOB: 02/22/1956  Reason for Encounter: 2024 Patient Assistance Renewal Application for Ozempic   Patient is currently receiving patient assistance for her Ozempic through [employer] Patient Assistance Program. [employer] requires all Medicare patient's to renew their applications starting 07/27/2022.  Renewal Application started, and will be mailed to the patient's home address for her to complete. Once completed by the patient she will be instructed to return the application along with her proof of income to Alex at [facility].  Application e-mailed to PTM so she can mail the application to the patient's home address. Patient can contact me directly @ [phone] if she has any questions.  Medications: Outpatient Encounter Medications as of 07/18/2022  Medication Sig   amLODipine (NORVASC) 10 MG tablet Take 1 tablet (10 mg total) by mouth daily.   aspirin 81 MG chewable tablet Chew by mouth.   b complex vitamins capsule Take 1 capsule by mouth daily.   Biotin 5000 MCG TABS Take 2 tablets by mouth.   blood glucose meter kit and supplies Dispense based on patient and insurance preference. Use up to four times daily as directed. (FOR ICD-10 E10.9, E11.9).   Cholecalciferol (VITAMIN D3) 1000 UNITS CAPS Take 2,000 Units by mouth daily.   glipiZIDE (GLUCOTROL XL) 10 MG 24 hr tablet Take 1 tablet (10 mg total) by mouth daily with breakfast.   hydrochlorothiazide (HYDRODIURIL) 50 MG tablet Take 1 tablet (50 mg total) by mouth daily.   Lancets (ONETOUCH DELICA PLUS LANCET33G) MISC USE TO CHECK BLOOD SUGAR UP TO 4 TIMES DAILY   loratadine (CLARITIN) 10 MG tablet Take 1 tablet by mouth once daily   losartan (COZAAR) 100 MG tablet Take 1 tablet (100 mg total) by mouth daily.   Magnesium 100 MG TABS Take 100 mg by mouth daily.   metFORMIN (GLUCOPHAGE) 500 MG tablet Take 2 tablets (1,000 mg  total) by mouth 2 (two) times daily.   ONETOUCH ULTRA test strip USE TO CHECK BLOOD SUGAR UP TO 4 TIMES DAILY   pravastatin (PRAVACHOL) 40 MG tablet Take 1 tablet by mouth once daily   Semaglutide, 1 MG/DOSE, 4 MG/3ML SOPN Inject 1 mg as directed once a week. Patient receives via [employer] Patient Assistance through Dec 2023   vitamin C (ASCORBIC ACID) 500 MG tablet Take 500 mg by mouth daily.   No facility-administered encounter medications on file as of 07/18/2022.    Tosha Thompson, CPA/CMA Clinical Pharmacist Assistant Phone: [phone]   

## 2022-07-24 ENCOUNTER — Other Ambulatory Visit: Payer: Self-pay | Admitting: Family Medicine

## 2022-07-24 DIAGNOSIS — I10 Essential (primary) hypertension: Secondary | ICD-10-CM

## 2022-07-24 NOTE — Telephone Encounter (Signed)
Medication Refill - Medication: losartan (COZAAR) 100 MG tablet   Has the patient contacted their pharmacy? Yes.   (Agent: If no, request that the patient contact the pharmacy for the refill. If patient does not wish to contact the pharmacy document the reason why and proceed with request.) (Agent: If yes, when and what did the pharmacy advise?)  Preferred Pharmacy (with phone number or street name):  [facility] [place] - [address] Phone:  [phone]  Fax:  [phone]     Has the patient been seen for an appointment in the last year OR does the patient have an upcoming appointment? Yes.    Agent: Please be advised that RX refills may take up to 3 business days. We ask that you follow-up with your pharmacy.

## 2022-07-25 ENCOUNTER — Encounter: Payer: Self-pay | Admitting: Family Medicine

## 2022-07-25 ENCOUNTER — Ambulatory Visit (INDEPENDENT_AMBULATORY_CARE_PROVIDER_SITE_OTHER): Payer: HMO | Admitting: Family Medicine

## 2022-07-25 VITALS — BP 125/73 | HR 75 | Resp 16 | Ht 65.0 in | Wt 263.0 lb

## 2022-07-25 DIAGNOSIS — E11628 Type 2 diabetes mellitus with other skin complications: Secondary | ICD-10-CM

## 2022-07-25 DIAGNOSIS — L84 Corns and callosities: Secondary | ICD-10-CM | POA: Diagnosis not present

## 2022-07-25 LAB — POCT GLYCOSYLATED HEMOGLOBIN (HGB A1C)
Est. average glucose Bld gHb Est-mCnc: 160
Hemoglobin A1C: 7.2 % — AB (ref 4.0–5.6)

## 2022-07-25 MED ORDER — LOSARTAN POTASSIUM 100 MG PO TABS: 100.0000 mg | ORAL_TABLET | Freq: Every day | ORAL | 0 refills | Status: DC

## 2022-07-25 MED ORDER — GABAPENTIN 100 MG PO CAPS: 100.0000 mg | ORAL_CAPSULE | Freq: Every day | ORAL | 11 refills | Status: DC

## 2022-07-25 NOTE — Assessment & Plan Note (Signed)
Chronic, improved A1c at goal Will send for eye exam Weight loss noted; congratulated Continue Glipizide 10 mg, Ozempic 1 mg and 1000 mg Metformin Not currently on ACE or ARB On Statin- pravachol 40

## 2022-07-25 NOTE — Telephone Encounter (Signed)
Requested Prescriptions  Pending Prescriptions Disp Refills  . losartan (COZAAR) 100 MG tablet 90 tablet 0    Sig: Take 1 tablet (100 mg total) by mouth daily.     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 [facility])   [facility] Gwyneth Sprout, FNP   3 months ago Annual physical exam   [facility] Tally Joe T, FNP   6 months ago Controlled type 2 diabetes mellitus with hyperglycemia, without long-term current use of insulin [facility])   [facility] Tally Joe T, FNP   9 months ago Controlled type 2 diabetes mellitus with hyperglycemia, without long-term current use of insulin [facility])   [facility] Tally Joe T, FNP   11 months ago Leg pain, bilateral   [facility] Birdie Sons, MD

## 2022-07-25 NOTE — Progress Notes (Signed)
Established patient visit   Patient: Jodi Becker   DOB: October 22, 1955   65 y.o. Female  MRN: [id number] Visit Date: 07/25/2022  Today's healthcare provider: Gwyneth Sprout, FNP  Introduced to nurse practitioner role and practice setting.  All questions answered.  Discussed provider/patient relationship and expectations.  I,Tiffany J Bragg,acting as a scribe for Gwyneth Sprout, FNP.,have documented all relevant documentation on the behalf of Gwyneth Sprout, FNP,as directed by  Gwyneth Sprout, FNP while in the presence of Gwyneth Sprout, FNP.   Chief Complaint  Patient presents with   Diabetes   Subjective    HPI  Diabetes Mellitus Type II, Follow-up  Lab Results  Component Value Date   HGBA1C 7.2 (A) 07/25/2022   HGBA1C 8.3 (H) 04/23/2022   HGBA1C 7.2 (A) 01/21/2022   Wt Readings from Last 3 Encounters:  07/25/22 263 lb (119.3 kg)  04/23/22 273 lb 1.6 oz (123.9 kg)  01/21/22 269 lb 6.4 oz (122.2 kg)   Last seen for diabetes 3 months ago.  Management since then includes continue medication. She reports excellent compliance with treatment. She is having side effects. Constipation Symptoms: No fatigue No foot ulcerations  No appetite changes No nausea  Yes paresthesia of the feet  No polydipsia  No polyuria No visual disturbances   No vomiting     Home blood sugar records:  around 160  Episodes of hypoglycemia? No    Current insulin regiment: NA Most Recent Eye Exam: early this year Current exercise: walking Current diet habits: well balanced  Pertinent Labs: Lab Results  Component Value Date   CHOL 133 04/23/2022   HDL 69 04/23/2022   LDLCALC 48 04/23/2022   TRIG 86 04/23/2022   CHOLHDL 1.9 04/23/2022   Lab Results  Component Value Date   NA 138 04/23/2022   K 4.3 04/23/2022   CREATININE 0.68 04/23/2022   EGFR 97 04/23/2022   MICROALBUR 50 03/13/2017   LABMICR <3.0 10/22/2021      ---------------------------------------------------------------------------------------------------   Medications: Outpatient Medications Prior to Visit  Medication Sig   amLODipine (NORVASC) 10 MG tablet Take 1 tablet (10 mg total) by mouth daily.   aspirin 81 MG chewable tablet Chew by mouth.   b complex vitamins capsule Take 1 capsule by mouth daily.   Biotin 5000 MCG TABS Take 2 tablets by mouth.   blood glucose meter kit and supplies Dispense based on patient and insurance preference. Use up to four times daily as directed. (FOR ICD-10 E10.9, E11.9).   Cholecalciferol (VITAMIN D3) 1000 UNITS CAPS Take 2,000 Units by mouth daily.   glipiZIDE (GLUCOTROL XL) 10 MG 24 hr tablet Take 1 tablet (10 mg total) by mouth daily with breakfast.   hydrochlorothiazide (HYDRODIURIL) 50 MG tablet Take 1 tablet (50 mg total) by mouth daily.   Lancets (ONETOUCH DELICA PLUS [id number]) MISC USE TO CHECK BLOOD SUGAR UP TO 4 TIMES DAILY   loratadine (CLARITIN) 10 MG tablet Take 1 tablet by mouth once daily   Magnesium 100 MG TABS Take 100 mg by mouth daily.   ONETOUCH ULTRA test strip USE TO CHECK BLOOD SUGAR UP TO 4 TIMES DAILY   pravastatin (PRAVACHOL) 40 MG tablet Take 1 tablet by mouth once daily   Semaglutide, 1 MG/DOSE, 4 MG/3ML SOPN Inject 1 mg as directed once a week. Patient receives via [employer] Patient Assistance through Dec 2023   [facility] injection    vitamin C (ASCORBIC ACID) 500 MG  tablet Take 500 mg by mouth daily.   [DISCONTINUED] losartan (COZAAR) 100 MG tablet Take 1 tablet (100 mg total) by mouth daily.   metFORMIN (GLUCOPHAGE) 500 MG tablet Take 2 tablets (1,000 mg total) by mouth 2 (two) times daily.   No facility-administered medications prior to visit.    Review of Systems    Objective    BP 125/73 (BP Location: Right Arm, Patient Position: Sitting, Cuff Size: Large)   Pulse 75   Resp 16   Ht $R'5\' 5"'Uu$  (1.651 m)   Wt 263 lb (119.3 kg)   SpO2 98%   BMI 43.77 kg/m    Physical Exam Vitals and nursing note reviewed.  Constitutional:      General: She is not in acute distress.    Appearance: Normal appearance. She is obese. She is not ill-appearing, toxic-appearing or diaphoretic.  HENT:     Head: Normocephalic and atraumatic.  Cardiovascular:     Rate and Rhythm: Normal rate and regular rhythm.     Pulses: Normal pulses.     Heart sounds: Normal heart sounds. No murmur heard.    No friction rub. No gallop.  Pulmonary:     Effort: Pulmonary effort is normal. No respiratory distress.     Breath sounds: Normal breath sounds. No stridor. No wheezing, rhonchi or rales.  Chest:     Chest wall: No tenderness.  Musculoskeletal:        General: No swelling, tenderness, deformity or signs of injury. Normal range of motion.     Right lower leg: No edema.     Left lower leg: No edema.  Skin:    General: Skin is warm and dry.     Capillary Refill: Capillary refill takes less than 2 seconds.     Coloration: Skin is not jaundiced or pale.     Findings: No bruising, erythema, lesion or rash.  Neurological:     General: No focal deficit present.     Mental Status: She is alert and oriented to person, place, and time. Mental status is at baseline.     Cranial Nerves: No cranial nerve deficit.     Sensory: No sensory deficit.     Motor: No weakness.     Coordination: Coordination normal.  Psychiatric:        Mood and Affect: Mood normal.        Behavior: Behavior normal.        Thought Content: Thought content normal.        Judgment: Judgment normal.      Results for orders placed or performed in visit on 07/25/22  POCT glycosylated hemoglobin (Hb A1C)  Result Value Ref Range   Hemoglobin A1C 7.2 (A) 4.0 - 5.6 %   Est. 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  Vonna Kotyk, FNP, have reviewed all documentation for this visit. The documentation on 07/25/22 for the exam, diagnosis, procedures, and orders are all accurate and complete.  Gwyneth Sprout, [place] [phone] (phone) [phone] (fax)  [place]

## 2022-07-31 ENCOUNTER — Telehealth: Payer: Self-pay

## 2022-07-31 NOTE — Telephone Encounter (Signed)
Copied from [place] [id number]. Topic: General - Inquiry >> Jul 30, 2022  2:29 PM Jodi Becker wrote: Reason for CRM: [facility] at [phone] got a fax requesting most resent Eye Exam... They have never seen pt nor are they in their system. Just FYI

## 2022-07-31 NOTE — Telephone Encounter (Signed)
Called patient to ask where she got her most recent eye exam from. LMTCB

## 2022-07-31 NOTE — Telephone Encounter (Signed)
Noted, eye exam requested.

## 2022-07-31 NOTE — Telephone Encounter (Signed)
Pt returning the missed call stated the most recent eye exam was at [facility] at the beginning of the year. Address: [address] [place], [place] [address]  Please advise.

## 2022-08-29 ENCOUNTER — Ambulatory Visit (INDEPENDENT_AMBULATORY_CARE_PROVIDER_SITE_OTHER): Payer: HMO

## 2022-08-29 DIAGNOSIS — E1169 Type 2 diabetes mellitus with other specified complication: Secondary | ICD-10-CM

## 2022-08-29 NOTE — Progress Notes (Signed)
Chronic Care Management Pharmacy Note  09/03/2022 Name:  PHILISHA LILEY MRN:  [id number] DOB:  1955-11-22  Summary: Patient presents for CCM follow-up. Fasting blood sugar averaging in the 150s. She continues to work on weight loss goal.   Recommendations/Changes made from today's visit: Continue current medications  Plan: CPP follow-up 3 months   Subjective: [place] A Colangelo is an 66 y.o. year old female who is a primary patient of Jacky Kindle, FNP.  The CCM team was consulted for assistance with disease management and care coordination needs.    Engaged with patient by telephone for follow up visit in response to provider referral for pharmacy case management and/or care coordination services.   Consent to Services:  The patient was given information about Chronic Care Management services, agreed to services, and gave verbal consent prior to initiation of services.  Please see initial visit note for detailed documentation.   Patient Care Team: Jacky Kindle, FNP as PCP - General (Family Medicine) Gaspar Cola, [facility] (Pharmacist)  Recent office visits: 07/25/22: Patient presented to Merita Norton, FNP for follow-up. A1c 7.2%. Gabapentin 100 mg.   04/23/22: Patient presented to Merita Norton, FNP for follow-up.   Recent consult visits: None in past 6 months.   Hospital visits: None in past 6 months   Objective:  Lab Results  Component Value Date   CREATININE 0.68 04/23/2022   BUN 16 04/23/2022   EGFR 97 04/23/2022   GFRNONAA 77 09/17/2020   GFRAA 89 09/17/2020   NA 138 04/23/2022   K 4.3 04/23/2022   CALCIUM 11.3 (H) 04/23/2022   CO2 23 04/23/2022   GLUCOSE 157 (H) 04/23/2022    Lab Results  Component Value Date/Time   HGBA1C 7.2 (A) 07/25/2022 08:23 AM   HGBA1C 8.3 (H) 04/23/2022 09:36 AM   HGBA1C 7.2 (A) 01/21/2022 08:44 AM   HGBA1C 8.4 (H) 04/11/2021 08:44 AM   MICROALBUR 50 03/13/2017 08:38 AM   MICROALBUR 50 01/14/2016 08:36 AM    Last  diabetic Eye exam:  Lab Results  Component Value Date/Time   HMDIABEYEEXA No Retinopathy 09/22/2019 12:00 AM    Last diabetic Foot exam: No results found for: "HMDIABFOOTEX"   Lab Results  Component Value Date   CHOL 133 04/23/2022   HDL 69 04/23/2022   LDLCALC 48 04/23/2022   TRIG 86 04/23/2022   CHOLHDL 1.9 04/23/2022       Latest Ref Rng & Units 04/23/2022    9:36 AM 10/22/2021    9:02 AM 04/11/2021    8:44 AM  Hepatic Function  Total Protein 6.0 - 8.5 g/dL 6.8  7.2  6.9   Albumin 3.9 - 4.9 g/dL 4.3  4.8  4.6   AST 0 - 40 IU/L 24  37  29   ALT 0 - 32 IU/L 43  58  45   Alk Phosphatase 44 - 121 IU/L 111  101  101   Total Bilirubin 0.0 - 1.2 mg/dL 0.4  0.4  0.4     Lab Results  Component Value Date/Time   TSH 2.900 04/23/2022 09:36 AM   TSH 1.060 09/17/2020 08:09 AM   FREET4 1.19 04/23/2022 09:36 AM       Latest Ref Rng & Units 04/23/2022    9:36 AM 09/17/2020    8:09 AM 08/25/2019   12:04 PM  CBC  WBC 3.4 - 10.8 x10E3/uL 5.8  6.0  4.7   Hemoglobin 11.1 - 15.9 g/dL 16.0  10.9  32.3  Hematocrit 34.0 - 46.6 % 40.1  40.7  42.1   Platelets 150 - 450 x10E3/uL 195  227  205     No results found for: "VD25OH"  Clinical ASCVD: No  The 10-year ASCVD risk score (Arnett DK, et al., 2019) is: 14%   Values used to calculate the score:     Age: 70 years     Sex: Female     Is Non-Hispanic African American: Yes     Diabetic: Yes     Tobacco smoker: No     Systolic Blood Pressure: 125 mmHg     Is BP treated: Yes     HDL Cholesterol: 69 mg/dL     Total Cholesterol: 133 mg/dL       46/96/2952    8:41 AM 04/23/2022    8:49 AM 08/21/2021   11:24 AM  Depression screen PHQ 2/9  Decreased Interest 0 0 0  Down, Depressed, Hopeless 0 0 0  PHQ - 2 Score 0 0 0  Altered sleeping 0 0 0  Tired, decreased energy 0 0 0  Change in appetite 0 0 0  Feeling bad or failure about yourself  0 0 0  Trouble concentrating 0 0 0  Moving slowly or fidgety/restless 0 0 0  Suicidal  thoughts 0 0 0  PHQ-9 Score 0 0 0  Difficult doing work/chores Not difficult at all Not difficult at all Not difficult at all    Social History   Tobacco Use  Smoking Status Never  Smokeless Tobacco Never   BP Readings from Last 3 Encounters:  07/25/22 125/73  04/23/22 136/72  01/21/22 140/73   Pulse Readings from Last 3 Encounters:  07/25/22 75  04/23/22 72  01/21/22 72   Wt Readings from Last 3 Encounters:  07/25/22 263 lb (119.3 kg)  04/23/22 273 lb 1.6 oz (123.9 kg)  01/21/22 269 lb 6.4 oz (122.2 kg)   BMI Readings from Last 3 Encounters:  07/25/22 43.77 kg/m  04/23/22 45.45 kg/m  01/21/22 44.83 kg/m    Assessment/Interventions: Review of patient past medical history, allergies, medications, health status, including review of consultants reports, laboratory and other test data, was performed as part of comprehensive evaluation and provision of chronic care management services.   SDOH:  (Social Determinants of Health) assessments and interventions performed: Yes SDOH Interventions    Flowsheet Row Chronic Care Management from 01/27/2022 in [facility]  SDOH Interventions   Transportation Interventions Intervention Not Indicated  Financial Strain Interventions Other (Comment)  [PAP]       SDOH Screenings   Food Insecurity: No Food Insecurity (11/23/2018)  Transportation Needs: No Transportation Needs (01/27/2022)  Alcohol Screen: Low Risk  (07/25/2022)  Depression (PHQ2-9): Low Risk  (07/25/2022)  Financial Resource Strain: High Risk (01/27/2022)  Physical Activity: Insufficiently Active (11/23/2018)  Social Connections: Somewhat Isolated (11/23/2018)  Stress: No Stress Concern Present (11/23/2018)  Tobacco Use: Low Risk  (07/25/2022)    CCM Care Plan  Allergies  Allergen Reactions   Sulfa Antibiotics     Medications Reviewed Today     Reviewed by Jacky Kindle, FNP (Family Nurse Practitioner) on 07/25/22 at 1003  Med List Status: <None>    Medication Order Taking? Sig Documenting Provider Last Dose Status Informant  amLODipine (NORVASC) 10 MG tablet [id number] Yes Take 1 tablet (10 mg total) by mouth daily. Jacky Kindle, FNP Taking Active   aspirin 81 MG chewable tablet [id number] Yes Chew by mouth. [provider] Taking Active  Med Note Lauretta Chester, Twilia Yaklin A   Mon Jan 27, 2022  1:28 PM)    b complex vitamins capsule [id number] Yes Take 1 capsule by mouth daily. [provider] Taking Active   Biotin 5000 MCG TABS [id number] Yes Take 2 tablets by mouth. [provider] Taking Active   blood glucose meter kit and supplies [id number] Yes Dispense based on patient and insurance preference. Use up to four times daily as directed. (FOR ICD-10 E10.9, E11.9). Jacky Kindle, FNP Taking Active   Cholecalciferol (VITAMIN D3) 1000 UNITS CAPS [id number] Yes Take 2,000 Units by mouth daily. [provider] Taking Active            Med Note Lauretta Chester, Jesusmanuel Erbes A   Mon Jan 27, 2022  1:28 PM)    gabapentin (NEURONTIN) 100 MG capsule [id number] Yes Take 1 capsule (100 mg total) by mouth at bedtime. Jacky Kindle, FNP  Active   glipiZIDE (GLUCOTROL XL) 10 MG 24 hr tablet [id number] Yes Take 1 tablet (10 mg total) by mouth daily with breakfast. Jacky Kindle, FNP Taking Active   hydrochlorothiazide (HYDRODIURIL) 50 MG tablet [id number] Yes Take 1 tablet (50 mg total) by mouth daily. Jacky Kindle, FNP Taking Active   Lancets [facility] PLUS [place]) MISC [id number] Yes USE TO CHECK BLOOD SUGAR UP TO 4 TIMES DAILY Jacky Kindle, FNP Taking Active   loratadine (CLARITIN) 10 MG tablet [id number] Yes Take 1 tablet by mouth once daily Jacky Kindle, FNP Taking Active   losartan (COZAAR) 100 MG tablet [id number]  Take 1 tablet (100 mg total) by mouth daily. Merita Norton T, FNP  Active   Magnesium 100 MG TABS [id number] Yes Take 100 mg by mouth daily. [provider] Taking Active   metFORMIN  (GLUCOPHAGE) 500 MG tablet [id number]  Take 2 tablets (1,000 mg total) by mouth 2 (two) times daily. Merita Norton T, FNP  Expired 06/09/22 2359   ONETOUCH ULTRA test strip [id number] Yes USE TO CHECK BLOOD SUGAR UP TO 4 TIMES DAILY Jacky Kindle, FNP Taking Active   pravastatin (PRAVACHOL) 40 MG tablet [id number] Yes Take 1 tablet by mouth once daily Jacky Kindle, FNP Taking Active   Semaglutide, 1 MG/DOSE, 4 MG/3ML SOPN [id number] Yes Inject 1 mg as directed once a week. Patient receives via [employer] Patient Assistance through Dec 2023 Jacky Kindle, FNP Taking Active   [facility] injection [id number] Yes  [provider] Taking Active   vitamin C (ASCORBIC ACID) 500 MG tablet [id number] Yes Take 500 mg by mouth daily. 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) 10/22/2021   Toenail fungus 10/22/2021   Need for shingles vaccine 10/22/2021   Leg pain, bilateral 08/21/2021   Colon cancer screening 07/22/2021   Fungal rash of torso 07/22/2021   Morbid obesity (HCC) 05/17/2020   T2DM (type 2 diabetes mellitus) (HCC) 11/13/2016   Allergic rhinitis 07/09/2015   Eustachian tube dysfunction 07/09/2015   Abnormal ECG 04/25/2015   History of  colon cancer 04/25/2015   Diastolic dysfunction 04/25/2015   Generalized headache 04/25/2015   Cannot sleep 04/25/2015   MI (mitral incompetence) 04/25/2015   Hyperlipidemia associated with type 2 diabetes mellitus (HCC) 04/20/2015   Anterior tibial tendonitis 05/05/2014    Hypertension associated with diabetes (HCC) 05/24/2009    Immunization History  Administered Date(s) Administered   Influenza,inj,Quad PF,6+ Mos 07/09/2015   Influenza-Unspecified 07/22/2016, 07/27/2017   Moderna Covid-19 Vaccine Bivalent Booster 61yrs & up 09/04/2021   Moderna Sars-Covid-2 Vaccination 11/01/2019, 11/29/2019, 09/05/2020   PNEUMOCOCCAL CONJUGATE-20 10/22/2021   Pneumococcal-Unspecified 12/05/2009   Tdap 10/22/2021   Zoster Recombinat (Shingrix) 10/28/2021, 03/22/2022   Zoster, Live 07/22/2016    Conditions to be addressed/monitored:  Hypertension, Hyperlipidemia, Diabetes, and Allergic Rhinitis  Care Plan : General Pharmacy (Adult)  Updates made by Gaspar Cola, RPH since 09/03/2022 12:00 AM     Problem: Hypertension, Hyperlipidemia, Diabetes, and Allergic Rhinitis   Priority: High     Long-Range Goal: Patient-Specific Goal   Start Date: 01/27/2022  Expected End Date: 01/28/2023  This Visit's Progress: On track  Recent Progress: On track  Priority: High  Note:   Current Barriers:  Unable to independently afford treatment regimen  Pharmacist Clinical Goal(s):  Patient will verbalize ability to afford treatment regimen through collaboration with PharmD and provider.   Interventions: 1:1 collaboration with Jacky Kindle, FNP regarding development and update of comprehensive plan of care as evidenced by provider attestation and co-signature Inter-disciplinary care team collaboration (see longitudinal plan of care) Comprehensive medication review performed; medication list updated in electronic medical record  Hypertension (BP goal <130/80) -Controlled -Current treatment: Amlodipine 10 mg daily: Appropriate, Effective, Query Safe HCTZ 50 mg daily: Appropriate, Effective, Safe, Accessible Losartan 100 mg daily: Appropriate, Effective, Safe, Accessible  -Medications previously tried: Chlorthalidone  -Current home blood pressures: 115/69 pulse 71   -Continue current medications   Hyperlipidemia: (LDL goal < 70) -Controlled -Current treatment: Pravastatin 40 mg daily: Appropriate, Effective, Safe, Accessible  -Medications previously tried: NA  -Recommended to continue current medication  Diabetes (A1c goal <7%) -Uncontrolled, but improving.  -Current medications: Ozempic 1 mg weekly:  Glipizide XL 10 mg daily: Appropriate, Query effective Metformin 500 mg 2 tablets twice daily: Appropriate, Query effective -Medications previously tried: Actos, Januvia, Tradjenta   -Current home glucose readings 161 152 167 145 143 164 151 155 160 -Primary goals are weight loss, limit overeating.  -Denies hypoglycemic/hyperglycemic symptoms -Current meal patterns: Struggles with portion control  -Current exercise: Housework, working as a Clinical biochemist. Limited by leg pain.  -Continue current medications to assess full impact of ozempic.   Allergic Rhinitis (Goal: Minimize symptoms) -Controlled -Current treatment  Flonase 50 mcg/act: Appropriate, Effective, Safe, Accessible  Loratadine 10 mg daily: Appropriate, Effective, Safe, Accessible  -Medications previously tried: NA  -Still has some breakthrough symptoms, much better control.  -Recommended to continue current medication  Patient Goals/Self-Care Activities Patient will:  - check glucose daily before breakfast, document, and provide at future appointments check blood pressure weekly, document, and provide at future appointments  Follow Up Plan: Telephone follow up appointment with care management team member scheduled for:  12/08/2022 at 8:30 AM    Medication Assistance:  Ozempic obtained through [employer] medication assistance program.  Enrollment ends Dec 2023  Compliance/Adherence/Medication fill history: Care Gaps: Tetanus/TDAP Zoster Vaccines Diabetic Eye Exam COVID-19 Booster 4 PNA Vaccine  Dexa Scan Diabetic Foot Exam- Patient recently saw Podiatry  Star-Rating  Drugs: Glipizide 10 mg last filled on 03/05/22 for  a 90-Day supply with [facility] Trulicity 1.5 mg last filled on 03/05/22 for a 84-Day supply with [facility] Metformin 500 mg last filled on 03/05/22 for a 90-Day supply with [facility] Pravastatin 40 mg last filled on 03/05/22 for a 90-Day supply with [facility] Losartan 100 mg last filled on 03/05/22 for a 90-Day supply with [facility]  Patient's preferred pharmacy is:  [facility] [address]), [place] - [address] [place]) [place] [address] Phone: [phone] Fax: [phone]  [facility] - [place], [place] - [address] [place] [address] Phone: [phone] Fax: [phone]  Uses pill box? Yes Pt endorses 100% compliance  We discussed: Current pharmacy is preferred with insurance plan and patient is satisfied with pharmacy services Patient decided to: Continue current medication management strategy  Care Plan and Follow Up Patient Decision:  Patient agrees to Care Plan and Follow-up.  Plan: Telephone follow up appointment with care management team member scheduled for:  12/08/2022 at 8:30 AM  Angelena Sole, PharmD, P[place]y [place], CPP  Clinical Pharmacist Practitioner  [facility] [phone]

## 2022-09-01 ENCOUNTER — Telehealth: Payer: HMO

## 2022-09-03 NOTE — Patient Instructions (Signed)
Visit Information It was great speaking with you today!  Please let me know if you have any questions about our visit.   Goals Addressed             This Visit's Progress    Monitor and Manage My Blood Sugar-Diabetes Type 2   On track    Timeframe:  Long-Range Goal Priority:  High Start Date: 01/27/22                            Expected End Date: 01/28/23                      Follow Up within 30 days   - check blood sugar daily before breakfast - check blood sugar if I feel it is too high or too low - enter blood sugar readings and medication or insulin into daily log - take the blood sugar log to all doctor visits    Why is this important?   Checking your blood sugar at home helps to keep it from getting very high or very low.  Writing the results in a diary or log helps the doctor know how to care for you.  Your blood sugar log should have the time, date and the results.  Also, write down the amount of insulin or other medicine that you take.  Other information, like what you ate, exercise done and how you were feeling, will also be helpful.     Notes:      Track and Manage My Blood Pressure-Hypertension   On track    Timeframe:  Long-Range Goal Priority:  High Start Date: 01/27/22                            Expected End Date: 01/28/23                      Follow Up within 30 days   - check blood pressure weekly - write blood pressure results in a log or diary    Why is this important?   You won't feel high blood pressure, but it can still hurt your blood vessels.  High blood pressure can cause heart or kidney problems. It can also cause a stroke.  Making lifestyle changes like losing a little weight or eating less salt will help.  Checking your blood pressure at home and at different times of the day can help to control blood pressure.  If the doctor prescribes medicine remember to take it the way the doctor ordered.  Call the office if you cannot afford the medicine  or if there are questions about it.     Notes:         Patient Care Plan: General Pharmacy (Adult)     Problem Identified: Hypertension, Hyperlipidemia, Diabetes, and Allergic Rhinitis   Priority: High     Long-Range Goal: Patient-Specific Goal   Start Date: 01/27/2022  Expected End Date: 01/28/2023  This Visit's Progress: On track  Recent Progress: On track  Priority: High  Note:   Current Barriers:  Unable to independently afford treatment regimen  Pharmacist Clinical Goal(s):  Patient will verbalize ability to afford treatment regimen through collaboration with PharmD and provider.   Interventions: 1:1 collaboration with Gwyneth Sprout, FNP regarding development and update of comprehensive plan of care as evidenced by provider attestation and co-signature Inter-disciplinary  care team collaboration (see longitudinal plan of care) Comprehensive medication review performed; medication list updated in electronic medical record  Hypertension (BP goal <130/80) -Controlled -Current treatment: Amlodipine 10 mg daily: Appropriate, Effective, Query Safe HCTZ 50 mg daily: Appropriate, Effective, Safe, Accessible Losartan 100 mg daily: Appropriate, Effective, Safe, Accessible  -Medications previously tried: Chlorthalidone  -Current home blood pressures: 115/69 pulse 71  -Continue current medications   Hyperlipidemia: (LDL goal < 70) -Controlled -Current treatment: Pravastatin 40 mg daily: Appropriate, Effective, Safe, Accessible  -Medications previously tried: NA  -Recommended to continue current medication  Diabetes (A1c goal <7%) -Uncontrolled, but improving.  -Current medications: Ozempic 1 mg weekly:  Glipizide XL 10 mg daily: Appropriate, Query effective Metformin 500 mg 2 tablets twice daily: Appropriate, Query effective -Medications previously tried: Actos, Januvia, Tradjenta   -Current home glucose readings 161 152 167 145 143 164 151 155 160 -Primary  goals are weight loss, limit overeating.  -Denies hypoglycemic/hyperglycemic symptoms -Current meal patterns: Struggles with portion control  -Current exercise: Housework, working as a Technical brewer. Limited by leg pain.  -Continue current medications to assess full impact of ozempic.   Allergic Rhinitis (Goal: Minimize symptoms) -Controlled -Current treatment  Flonase 50 mcg/act: Appropriate, Effective, Safe, Accessible  Loratadine 10 mg daily: Appropriate, Effective, Safe, Accessible  -Medications previously tried: NA  -Still has some breakthrough symptoms, much better control.  -Recommended to continue current medication  Patient Goals/Self-Care Activities Patient will:  - check glucose daily before breakfast, document, and provide at future appointments check blood pressure weekly, document, and provide at future appointments  Follow Up Plan: Telephone follow up appointment with care management team member scheduled for:  12/08/2022 at 8:30 AM    Patient agreed to services and verbal consent obtained.   Patient verbalizes understanding of instructions and care plan provided today and agrees to view in [place]. Active MyChart status and patient understanding of how to access instructions and care plan via MyChart confirmed with patient.     [name], PharmD, Para March, CPP  Clinical Pharmacist Practitioner  [facility] [phone]

## 2022-09-08 ENCOUNTER — Ambulatory Visit (INDEPENDENT_AMBULATORY_CARE_PROVIDER_SITE_OTHER): Payer: HMO | Admitting: Physician Assistant

## 2022-09-08 ENCOUNTER — Encounter: Payer: Self-pay | Admitting: Physician Assistant

## 2022-09-08 VITALS — BP 137/72 | HR 80 | Resp 16 | Ht 64.0 in | Wt 257.0 lb

## 2022-09-08 DIAGNOSIS — M79605 Pain in left leg: Secondary | ICD-10-CM | POA: Diagnosis not present

## 2022-09-08 DIAGNOSIS — M79604 Pain in right leg: Secondary | ICD-10-CM | POA: Diagnosis not present

## 2022-09-08 NOTE — Progress Notes (Unsigned)
   I,Tiffany J Bragg,acting as a scribe for Janna Ostwalt, PA-C.,have documented all relevant documentation on the behalf of Janna Ostwalt, PA-C,as directed by  Janna Ostwalt, PA-C while in the presence of Janna Ostwalt, PA-C.   Established patient visit   Patient: Jodi Becker   DOB: 11/20/1955   66 y.o. Female  MRN: [id number] Visit Date: 09/08/2022  Today's healthcare provider: Janna Ostwalt, PA-C   No chief complaint on file.  Subjective    HPI  Report having leg pain through Thursday till now. She has been having troubles to do double shifts. She has been having leg pain and some swelling of her left knee since last Thursday.  Medications: Outpatient Medications Prior to Visit  Medication Sig   amLODipine (NORVASC) 10 MG tablet Take 1 tablet (10 mg total) by mouth daily.   aspirin 81 MG chewable tablet Chew by mouth.   b complex vitamins capsule Take 1 capsule by mouth daily.   Biotin 5000 MCG TABS Take 2 tablets by mouth.   blood glucose meter kit and supplies Dispense based on patient and insurance preference. Use up to four times daily as directed. (FOR ICD-10 E10.9, E11.9).   Cholecalciferol (VITAMIN D3) 1000 UNITS CAPS Take 2,000 Units by mouth daily.   gabapentin (NEURONTIN) 100 MG capsule Take 1 capsule (100 mg total) by mouth at bedtime.   glipiZIDE (GLUCOTROL XL) 10 MG 24 hr tablet Take 1 tablet (10 mg total) by mouth daily with breakfast.   hydrochlorothiazide (HYDRODIURIL) 50 MG tablet Take 1 tablet (50 mg total) by mouth daily.   Lancets (ONETOUCH DELICA PLUS [id number]) MISC USE TO CHECK BLOOD SUGAR UP TO 4 TIMES DAILY   loratadine (CLARITIN) 10 MG tablet Take 1 tablet by mouth once daily   losartan (COZAAR) 100 MG tablet Take 1 tablet (100 mg total) by mouth daily.   Magnesium 100 MG TABS Take 100 mg by mouth daily.   metFORMIN (GLUCOPHAGE) 500 MG tablet Take 2 tablets (1,000 mg total) by mouth 2 (two) times daily.   ONETOUCH ULTRA test strip USE TO CHECK  BLOOD SUGAR UP TO 4 TIMES DAILY   pravastatin (PRAVACHOL) 40 MG tablet Take 1 tablet by mouth once daily   Semaglutide, 1 MG/DOSE, 4 MG/3ML SOPN Inject 1 mg as directed once a week. Patient receives via [employer] Patient Assistance through Dec 2023   SHINGRIX injection    vitamin C (ASCORBIC ACID) 500 MG tablet Take 500 mg by mouth daily.   No facility-administered medications prior to visit.    Review of Systems  All other systems reviewed and are negative. Except see HPI  {Labs  Heme  Chem  Endocrine  Serology  Results Review (optional):23779}   Objective    There were no vitals taken for this visit. {Show previous vital signs (optional):23777}  Physical Exam Vitals reviewed.  Constitutional:      General: She is not in acute distress.    Appearance: Normal appearance. She is well-developed. She is not diaphoretic.  HENT:     Head: Normocephalic and atraumatic.  Eyes:     General: No scleral icterus.    Conjunctiva/sclera: Conjunctivae normal.  Neck:     Thyroid: No thyromegaly.  Cardiovascular:     Rate and Rhythm: Normal rate and regular rhythm.     Pulses: Normal pulses.     Heart sounds: Normal heart sounds. No murmur heard. Pulmonary:     Effort: Pulmonary effort is normal. No respiratory distress.       Breath sounds: Normal breath sounds. No wheezing, rhonchi or rales.  Musculoskeletal:     Cervical back: Neck supple.     Right lower leg: No edema.     Left lower leg: No edema.  Lymphadenopathy:     Cervical: No cervical adenopathy.  Skin:    General: Skin is warm and dry.     Findings: No rash.  Neurological:     Mental Status: She is alert and oriented to person, place, and time. Mental status is at baseline.     Motor: No weakness.  Psychiatric:        Behavior: Behavior normal.        Thought Content: Thought content normal.        Judgment: Judgment normal.       No results found for any visits on 09/08/22.  Assessment & Plan     1.  Pain in both lower extremities Chronic but worsen since last Thursday. Could not reach us due to holidays.  Per pt request , a work note was provided However, pt was encouraged to proceed to urgent care if we are not available and she needs to be seen. Pt agreed and expressed her understanding.  FU with her PCP as needed     The patient was advised to call back or seek an in-person evaluation if the symptoms worsen or if the condition fails to improve as anticipated.  I discussed the assessment and treatment plan with the patient. The patient was provided an opportunity to ask questions and all were answered. The patient agreed with the plan and demonstrated an understanding of the instructions.  The entirety of the information documented in the History of Present Illness, Review of Systems and Physical Exam were personally obtained by me. Portions of this information were initially documented by the CMA and reviewed by me for thoroughness and accuracy.   Janna Ostwalt, PAC, MMS [facility] [phone] (phone) [phone] (fax)  

## 2022-09-11 DIAGNOSIS — E785 Hyperlipidemia, unspecified: Secondary | ICD-10-CM

## 2022-09-11 DIAGNOSIS — E1169 Type 2 diabetes mellitus with other specified complication: Secondary | ICD-10-CM

## 2022-09-17 ENCOUNTER — Telehealth: Payer: Self-pay

## 2022-09-17 ENCOUNTER — Other Ambulatory Visit: Payer: Self-pay | Admitting: Family Medicine

## 2022-09-17 DIAGNOSIS — E1165 Type 2 diabetes mellitus with hyperglycemia: Secondary | ICD-10-CM

## 2022-09-17 NOTE — Progress Notes (Signed)
Chronic Care Management Pharmacy Assistant   Name: Jodi Becker  MRN: [id number] DOB: [birth year]  Reason for Encounter: Diabetes Disease State Call   Recent office visits:  09/08/2022 Mardene Speak, PA-C (PCP Office Visit) for Leg Pain- No medication changes noted, No orders placed  Recent consult visits:  None ID  Hospital visits:  None in previous 6 months  Medications: Outpatient Encounter Medications as of 09/17/2022  Medication Sig   amLODipine (NORVASC) 10 MG tablet Take 1 tablet (10 mg total) by mouth daily.   aspirin 81 MG chewable tablet Chew by mouth.   b complex vitamins capsule Take 1 capsule by mouth daily.   Biotin 5000 MCG TABS Take 2 tablets by mouth.   blood glucose meter kit and supplies Dispense based on patient and insurance preference. Use up to four times daily as directed. (FOR ICD-10 E10.9, E11.9).   Cholecalciferol (VITAMIN D3) 1000 UNITS CAPS Take 2,000 Units by mouth daily.   gabapentin (NEURONTIN) 100 MG capsule Take 1 capsule (100 mg total) by mouth at bedtime. (Patient not taking: Reported on 09/08/2022)   glipiZIDE (GLUCOTROL XL) 10 MG 24 hr tablet Take 1 tablet (10 mg total) by mouth daily with breakfast.   hydrochlorothiazide (HYDRODIURIL) 50 MG tablet Take 1 tablet (50 mg total) by mouth daily.   Lancets (ONETOUCH DELICA PLUS [id number]) MISC USE TO CHECK BLOOD SUGAR UP TO 4 TIMES DAILY   loratadine (CLARITIN) 10 MG tablet Take 1 tablet by mouth once daily   losartan (COZAAR) 100 MG tablet Take 1 tablet (100 mg total) by mouth daily.   Magnesium 100 MG TABS Take 100 mg by mouth daily.   metFORMIN (GLUCOPHAGE) 500 MG tablet Take 2 tablets by mouth twice daily   ONETOUCH ULTRA test strip USE TO CHECK BLOOD SUGAR UP TO 4 TIMES DAILY   pravastatin (PRAVACHOL) 40 MG tablet Take 1 tablet by mouth once daily   Semaglutide, 1 MG/DOSE, 4 MG/3ML SOPN Inject 1 mg as directed once a week. Patient receives via [employer] Patient Assistance through  Dec 2023   [facility] injection    vitamin C (ASCORBIC ACID) 500 MG tablet Take 500 mg by mouth daily.   No facility-administered encounter medications on file as of 09/17/2022.   Care Gaps: Diabetic Eye Exam  Star Rating Drugs: Glipizide 10 mg last filled on 09/05/2022 for a 90-Day supply with [place] Losartan 100 mg last filled on 07/25/2022 for a 90-Day supply with [facility] Metformin 500 mg last filled on 06/09/2022 for a 90-Day supply with [place] Pravastatin 40 mg last filled on 07/18/2022 for a 90-Day supply with [place] 1 mg patient receives this medication via [employer] Patient Assistance Program  Recent Relevant Labs: Lab Results  Component Value Date/Time   HGBA1C 7.2 (A) 07/25/2022 08:23 AM   HGBA1C 8.3 (H) 04/23/2022 09:36 AM   HGBA1C 7.2 (A) 01/21/2022 08:44 AM   HGBA1C 8.4 (H) 04/11/2021 08:44 AM   MICROALBUR 50 03/13/2017 08:38 AM   MICROALBUR 50 01/14/2016 08:36 AM    Kidney Function Lab Results  Component Value Date/Time   CREATININE 0.68 04/23/2022 09:36 AM   CREATININE 0.78 10/22/2021 09:02 AM   GFRNONAA 77 09/17/2020 08:09 AM   GFRAA 89 09/17/2020 08:09 AM   Current antihyperglycemic regimen:  Ozempic 1 mg weekly:  Glipizide XL 10 mg daily Metformin 500 mg 2 tablets twice daily  What recent interventions/DTPs have been made to improve glycemic control:  None ID  Have there been any recent hospitalizations or ED visits since last visit with CPP? No  Patient denies hypoglycemic symptoms, including Pale, Sweaty, Shaky, Hungry, Nervous/irritable, and Vision changes Patient denies hyperglycemic symptoms, including blurry vision, excessive thirst, fatigue, polyuria, and weakness How often are you checking your blood sugar? once daily What are your blood sugars ranging?  Fasting: 148, 157, 174 (last 3 days)  During the week, how often does your blood glucose drop below 70? Never Are you checking your feet  daily/regularly? Yes  Adherence Review: Is the patient currently on a STATIN medication? Yes Is the patient currently on ACE/ARB medication? Yes Does the patient have >5 day gap between last estimated fill dates? No  I spoke with patient, and she reports for the most part she is doing well. Patient stated her legs still hurt on a daily basis, but mainly when she works and stands on her legs for a long period of time. Patient reports her blood sugars have been about the same since last speaking with CPP. Patient reports taking all her medications as directed. Patient has no concerns or issues at this time.  I instructed patient if she needs anything she can give me a call directly @ [phone]  Patient has a follow-up with CPP on 12/08/2022 @ 0830.  Lynann Bologna, CPA/CMA Clinical Pharmacist Assistant Phone: [phone]

## 2022-10-09 ENCOUNTER — Telehealth: Payer: Self-pay

## 2022-10-09 NOTE — Progress Notes (Signed)
    Chronic Care Management Pharmacy Assistant   Name: CATILYN BOGGUS  MRN: [id number] DOB: October 29, 1955   Reason for Encounter: 2024 Patient Assistance Application Renewal Follow-up (Ozempic)   I contacted [place] to follow-up on the renewal application that was faxed over to them on 09/09/2022 to verify if they received the application, and if there is any additional information needed in order for them to process the application.  I spoke with the representative who advised that they did receive the patient's application and that they have everything needed at this time. She advised that they are in the process of doing benefit verification for this patient, but they have not processed the application yet it is in the middle of the processing stage.  Medications: Outpatient Encounter Medications as of 10/09/2022  Medication Sig   amLODipine (NORVASC) 10 MG tablet Take 1 tablet (10 mg total) by mouth daily.   aspirin 81 MG chewable tablet Chew by mouth.   b complex vitamins capsule Take 1 capsule by mouth daily.   Biotin 5000 MCG TABS Take 2 tablets by mouth.   blood glucose meter kit and supplies Dispense based on patient and insurance preference. Use up to four times daily as directed. (FOR ICD-10 E10.9, E11.9).   Cholecalciferol (VITAMIN D3) 1000 UNITS CAPS Take 2,000 Units by mouth daily.   gabapentin (NEURONTIN) 100 MG capsule Take 1 capsule (100 mg total) by mouth at bedtime. (Patient not taking: Reported on 09/08/2022)   glipiZIDE (GLUCOTROL XL) 10 MG 24 hr tablet Take 1 tablet (10 mg total) by mouth daily with breakfast.   hydrochlorothiazide (HYDRODIURIL) 50 MG tablet Take 1 tablet (50 mg total) by mouth daily.   Lancets (ONETOUCH DELICA PLUS [id number]) MISC USE TO CHECK BLOOD SUGAR UP TO 4 TIMES DAILY   loratadine (CLARITIN) 10 MG tablet Take 1 tablet by mouth once daily   losartan (COZAAR) 100 MG tablet Take 1 tablet (100 mg total) by mouth daily.   Magnesium 100 MG  TABS Take 100 mg by mouth daily.   metFORMIN (GLUCOPHAGE) 500 MG tablet Take 2 tablets by mouth twice daily   ONETOUCH ULTRA test strip USE TO CHECK BLOOD SUGAR UP TO 4 TIMES DAILY   pravastatin (PRAVACHOL) 40 MG tablet Take 1 tablet by mouth once daily   Semaglutide, 1 MG/DOSE, 4 MG/3ML SOPN Inject 1 mg as directed once a week. Patient receives via [employer] Patient Assistance through Dec 2023   [facility] injection    vitamin C (ASCORBIC ACID) 500 MG tablet Take 500 mg by mouth daily.   No facility-administered encounter medications on file as of 10/09/2022.    Lynann Bologna, CPA/CMA Clinical Pharmacist Assistant Phone: [phone]

## 2022-10-15 ENCOUNTER — Other Ambulatory Visit: Payer: Self-pay | Admitting: Family Medicine

## 2022-10-15 DIAGNOSIS — E7800 Pure hypercholesterolemia, unspecified: Secondary | ICD-10-CM

## 2022-10-16 NOTE — Telephone Encounter (Signed)
Requested Prescriptions  Pending Prescriptions Disp Refills   pravastatin (PRAVACHOL) 40 MG tablet [Pharmacy Med Name: Pravastatin Sodium 40 MG Oral Tablet] 90 tablet 0    Sig: Take 1 tablet by mouth once daily     Cardiovascular:  Antilipid - Statins Failed - 10/15/2022 10:51 PM      Failed - Lipid Panel in normal range within the last 12 months    Cholesterol, Total  Date Value Ref Range Status  04/23/2022 133 100 - 199 mg/dL Final   LDL Chol Calc (NIH)  Date Value Ref Range Status  04/23/2022 48 0 - 99 mg/dL Final   HDL  Date Value Ref Range Status  04/23/2022 69 >39 mg/dL Final   Triglycerides  Date Value Ref Range Status  04/23/2022 86 0 - 149 mg/dL Final         Passed - Patient is not pregnant      Passed - Valid encounter within last 12 months    Recent Outpatient Visits           1 month ago Pain in both lower extremities   [employer], [place], PA-C   2 months ago 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 [facility])   [facility] Gwyneth Sprout, FNP   5 months ago Annual physical exam   [facility] Tally Joe T, FNP   8 months ago Controlled type 2 diabetes mellitus with hyperglycemia, without long-term current use of insulin [facility])   [facility] Tally Joe T, FNP   11 months ago Controlled type 2 diabetes mellitus with hyperglycemia, without long-term current use of insulin [facility])   [facility] Gwyneth Sprout, FNP       Future Appointments             In 1 week Gwyneth Sprout, [place], [place]

## 2022-10-17 ENCOUNTER — Other Ambulatory Visit: Payer: Self-pay | Admitting: Family Medicine

## 2022-10-20 ENCOUNTER — Other Ambulatory Visit: Payer: Self-pay | Admitting: Family Medicine

## 2022-10-20 DIAGNOSIS — I10 Essential (primary) hypertension: Secondary | ICD-10-CM

## 2022-10-20 NOTE — Telephone Encounter (Signed)
Requested Prescriptions  Pending Prescriptions Disp Refills   loratadine (CLARITIN) 10 MG tablet [Pharmacy Med Name: Loratadine 10 MG Oral Tablet] 90 tablet 0    Sig: Take 1 tablet by mouth once daily     Ear, Nose, and Throat:  Antihistamines 2 Passed - 10/17/2022 10:21 PM      Passed - Cr in normal range and within 360 days    Creatinine, Ser  Date Value Ref Range Status  04/23/2022 0.68 0.57 - 1.00 mg/dL Final         Passed - Valid encounter within last 12 months    Recent Outpatient Visits           1 month ago Pain in both lower extremities   [facility] 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 Tally Joe T, FNP   9 months ago Controlled type 2 diabetes mellitus with hyperglycemia, without long-term current use of insulin [facility])   [facility] Tally Joe T, FNP   12 months ago Controlled type 2 diabetes mellitus with hyperglycemia, without long-term current use of insulin [facility])   [facility] Gwyneth Sprout, FNP       Future Appointments             In 4 days Gwyneth Sprout, [place], [facility]

## 2022-10-23 NOTE — Progress Notes (Signed)
Established patient visit   Patient: Jodi Becker   DOB: [birth year]   67 y.o. Female  MRN: [id number] Visit Date: 10/24/2022  Today's healthcare provider: Gwyneth Sprout, FNP   Chief Complaint  Patient presents with   Diabetes   Subjective    HPI  Diabetes Mellitus Type II, Follow-up  Lab Results  Component Value Date   HGBA1C 7.2 (A) 07/25/2022   HGBA1C 8.3 (H) 04/23/2022   HGBA1C 7.2 (A) 01/21/2022   Wt Readings from Last 3 Encounters:  10/24/22 255 lb (115.7 kg)  09/08/22 257 lb (116.6 kg)  07/25/22 263 lb (119.3 kg)   Last seen for diabetes 3 months ago.   Management since then includes Continue Glipizide 10 mg, Ozempic 1 mg and 1000 mg Metformin .  Symptoms: No fatigue No foot ulcerations  No appetite changes Yes nausea  No paresthesia of the feet  No polydipsia  No polyuria No visual disturbances   No vomiting     Home blood sugar records: fasting range: Fasting Blood Sugars 100s-160s   Episodes of hypoglycemia? No    Current insulin regiment: n/a   Most Recent Eye Exam: previously went to [place]; needs to find a new eye doctor, as they have not sent eye results over. [facility])   Pertinent Labs: Lab Results  Component Value Date   CHOL 133 04/23/2022   HDL 69 04/23/2022   LDLCALC 48 04/23/2022   TRIG 86 04/23/2022   CHOLHDL 1.9 04/23/2022   Lab Results  Component Value Date   NA 138 04/23/2022   K 4.3 04/23/2022   CREATININE 0.68 04/23/2022   EGFR 97 04/23/2022   LABMICR <3.0 10/22/2021   MICRALBCREAT <4 10/22/2021     ---------------------------------------------------------------------------------------------------   Medications: Outpatient Medications Prior to Visit  Medication Sig   amLODipine (NORVASC) 10 MG tablet Take 1 tablet (10 mg total) by mouth daily.   aspirin 81 MG chewable tablet Chew by mouth.   b complex vitamins capsule Take 1 capsule by mouth daily.   Biotin 5000 MCG TABS Take 2 tablets by  mouth.   blood glucose meter kit and supplies Dispense based on patient and insurance preference. Use up to four times daily as directed. (FOR ICD-10 E10.9, E11.9).   Cholecalciferol (VITAMIN D3) 1000 UNITS CAPS Take 2,000 Units by mouth daily.   gabapentin (NEURONTIN) 100 MG capsule Take 1 capsule (100 mg total) by mouth at bedtime.   glipiZIDE (GLUCOTROL XL) 10 MG 24 hr tablet Take 1 tablet (10 mg total) by mouth daily with breakfast.   hydrochlorothiazide (HYDRODIURIL) 50 MG tablet Take 1 tablet (50 mg total) by mouth daily.   Lancets (ONETOUCH DELICA PLUS [id number]) MISC USE TO CHECK BLOOD SUGAR UP TO 4 TIMES DAILY   loratadine (CLARITIN) 10 MG tablet Take 1 tablet by mouth once daily   losartan (COZAAR) 100 MG tablet Take 1 tablet by mouth once daily   Magnesium 100 MG TABS Take 100 mg by mouth daily.   metFORMIN (GLUCOPHAGE) 500 MG tablet Take 2 tablets by mouth twice daily   ONETOUCH ULTRA test strip USE TO CHECK BLOOD SUGAR UP TO 4 TIMES DAILY   pravastatin (PRAVACHOL) 40 MG tablet Take 1 tablet by mouth once daily   Semaglutide, 1 MG/DOSE, 4 MG/3ML SOPN Inject 1 mg as directed once a week. Patient receives via [employer] Patient Assistance through Dec 2023   vitamin C (ASCORBIC ACID) 500 MG tablet Take 500 mg by mouth  daily.   [DISCONTINUED] SHINGRIX injection    No facility-administered medications prior to visit.    Review of Systems  Last CBC Lab Results  Component Value Date   WBC 5.8 04/23/2022   HGB 13.0 04/23/2022   HCT 40.1 04/23/2022   MCV 84 04/23/2022   MCH 27.1 04/23/2022   RDW 14.3 04/23/2022   PLT 195 75/17/0017   Last metabolic panel Lab Results  Component Value Date   GLUCOSE 157 (H) 04/23/2022   NA 138 04/23/2022   K 4.3 04/23/2022   CL 100 04/23/2022   CO2 23 04/23/2022   BUN 16 04/23/2022   CREATININE 0.68 04/23/2022   EGFR 97 04/23/2022   CALCIUM 11.3 (H) 04/23/2022   PROT 6.8 04/23/2022   ALBUMIN 4.3 04/23/2022   LABGLOB 2.5 04/23/2022    AGRATIO 1.7 04/23/2022   BILITOT 0.4 04/23/2022   ALKPHOS 111 04/23/2022   AST 24 04/23/2022   ALT 43 (H) 04/23/2022   Last lipids Lab Results  Component Value Date   CHOL 133 04/23/2022   HDL 69 04/23/2022   LDLCALC 48 04/23/2022   TRIG 86 04/23/2022   CHOLHDL 1.9 04/23/2022   Last hemoglobin A1c Lab Results  Component Value Date   HGBA1C 7.2 (A) 07/25/2022       Objective    BP 129/72   Pulse 70   Ht '5\' 5"'$  (1.651 m)   Wt 255 lb (115.7 kg)   SpO2 100%   BMI 42.43 kg/m  BP Readings from Last 3 Encounters:  10/24/22 129/72  09/08/22 137/72  07/25/22 125/73   Wt Readings from Last 3 Encounters:  10/24/22 255 lb (115.7 kg)  09/08/22 257 lb (116.6 kg)  07/25/22 263 lb (119.3 kg)   SpO2 Readings from Last 3 Encounters:  10/24/22 100%  09/08/22 99%  07/25/22 98%      Physical Exam Vitals and nursing note reviewed.  Constitutional:      General: She is not in acute distress.    Appearance: Normal appearance. She is obese. She is not ill-appearing, toxic-appearing or diaphoretic.  HENT:     Head: Normocephalic and atraumatic.  Cardiovascular:     Rate and Rhythm: Normal rate and regular rhythm.     Pulses: Normal pulses.     Heart sounds: Normal heart sounds. No murmur heard.    No friction rub. No gallop.  Pulmonary:     Effort: Pulmonary effort is normal. No respiratory distress.     Breath sounds: Normal breath sounds. No stridor. No wheezing, rhonchi or rales.  Chest:     Chest wall: No tenderness.  Musculoskeletal:        General: No swelling, tenderness, deformity or signs of injury. Normal range of motion.     Right lower leg: No edema.     Left lower leg: No edema.  Skin:    General: Skin is warm and dry.     Capillary Refill: Capillary refill takes less than 2 seconds.     Coloration: Skin is not jaundiced or pale.     Findings: No bruising, erythema, lesion or rash.  Neurological:     General: No focal deficit present.     Mental Status:  She is alert and oriented to person, place, and time. Mental status is at baseline.     Cranial Nerves: No cranial nerve deficit.     Sensory: No sensory deficit.     Motor: No weakness.     Coordination: Coordination normal.  Psychiatric:        Mood and Affect: Mood normal.        Behavior: Behavior normal.        Thought Content: Thought content normal.        Judgment: Judgment normal.      No results found for any visits on 10/24/22.  Assessment & Plan     Problem List Items Addressed This Visit       Cardiovascular and Mediastinum   Hypertension associated with diabetes ([place])    Chronic, well controlled Goal <130/<80 Denies CP Denies SOB/ DOE Denies low blood pressure/hypotension Denies vision changes No LE Edema noted on exam Continue medication, Norvasc 10 mg, ASA 81, HCTZ 50 mg, Losartan 100 mg Denies side effects Reports "home numbers have been good" Seek emergent care if you develop chest pain or chest pressure       Relevant Orders   Comprehensive Metabolic Panel (CMET)   Hemoglobin A1c     Endocrine   Diabetes mellitus (HCC) - Primary    Chronic, previously stable - Checking BG at home: yes, 100-160s fastin - Medications: glipizide 10, metformin 1000 bid, ozempic 1 mg/weekly - Compliance: great - Diet: regular; working on eating less fat now that she is retired - Exercise: active around home, no purposeful exercise - eye exam: due; looking for new eye doctor - foot exam: UTD - microalbumin: will repeat today - denies symptoms of hypoglycemia, polyuria, polydipsia, numbness extremities, foot ulcers/trauma       Relevant Orders   Hemoglobin A1c   Urine Microalbumin w/creat. ratio   Hyperlipidemia associated with type 2 diabetes mellitus (HCC)    Chronic, previously stable Goal <70 LDL On pravachol 40 mg  Repeat LP recommend diet low in saturated fat and regular exercise - 30 min at least 5 times per week       Relevant Orders   Hemoglobin  A1c   Urine Microalbumin w/creat. ratio   Lipid panel     Other   Morbid obesity (HCC)    Chronic, improved Continue to recommend balanced, lower carb meals. Smaller meal size, adding snacks. Choosing water as drink of choice and increasing purposeful exercise. Discussed importance of healthy weight management Discussed diet and exercise Down 15-20#      Relevant Orders   Comprehensive Metabolic Panel (CMET)   Hemoglobin A1c   Urine Microalbumin w/creat. ratio   Lipid panel   Return in about 6 months (around 04/24/2023) for annual examination.     Vonna Kotyk, FNP, have reviewed all documentation for this visit. The documentation on 10/24/22 for the exam, diagnosis, procedures, and orders are all accurate and complete.  Gwyneth Sprout, [place] [phone] (phone) [phone] (fax)  [place]

## 2022-10-24 ENCOUNTER — Ambulatory Visit (INDEPENDENT_AMBULATORY_CARE_PROVIDER_SITE_OTHER): Payer: HMO | Admitting: Family Medicine

## 2022-10-24 ENCOUNTER — Encounter: Payer: Self-pay | Admitting: Family Medicine

## 2022-10-24 VITALS — BP 129/72 | HR 70 | Ht 65.0 in | Wt 255.0 lb

## 2022-10-24 DIAGNOSIS — I152 Hypertension secondary to endocrine disorders: Secondary | ICD-10-CM

## 2022-10-24 DIAGNOSIS — E1159 Type 2 diabetes mellitus with other circulatory complications: Secondary | ICD-10-CM | POA: Diagnosis not present

## 2022-10-24 DIAGNOSIS — E1169 Type 2 diabetes mellitus with other specified complication: Secondary | ICD-10-CM

## 2022-10-24 DIAGNOSIS — E785 Hyperlipidemia, unspecified: Secondary | ICD-10-CM

## 2022-10-24 NOTE — Assessment & Plan Note (Signed)
Chronic, previously stable - Checking BG at home: yes, 100-160s fastin - Medications: glipizide 10, metformin 1000 bid, ozempic 1 mg/weekly - Compliance: great - Diet: regular; working on eating less fat now that she is retired - Exercise: active around home, no purposeful exercise - eye exam: due; looking for new eye doctor - foot exam: UTD - microalbumin: will repeat today - denies symptoms of hypoglycemia, polyuria, polydipsia, numbness extremities, foot ulcers/trauma

## 2022-10-24 NOTE — Assessment & Plan Note (Signed)
Chronic, well controlled Goal <130/<80 Denies CP Denies SOB/ DOE Denies low blood pressure/hypotension Denies vision changes No LE Edema noted on exam Continue medication, Norvasc 10 mg, ASA 81, HCTZ 50 mg, Losartan 100 mg Denies side effects Reports "home numbers have been good" Seek emergent care if you develop chest pain or chest pressure

## 2022-10-24 NOTE — Assessment & Plan Note (Signed)
Chronic, previously stable Goal <70 LDL On pravachol 40 mg  Repeat LP recommend diet low in saturated fat and regular exercise - 30 min at least 5 times per week

## 2022-10-24 NOTE — Assessment & Plan Note (Signed)
Chronic, improved Continue to recommend balanced, lower carb meals. Smaller meal size, adding snacks. Choosing water as drink of choice and increasing purposeful exercise. Discussed importance of healthy weight management Discussed diet and exercise Down 15-20#

## 2022-10-25 ENCOUNTER — Other Ambulatory Visit: Payer: Self-pay | Admitting: Family Medicine

## 2022-10-25 DIAGNOSIS — I10 Essential (primary) hypertension: Secondary | ICD-10-CM

## 2022-10-25 LAB — COMPREHENSIVE METABOLIC PANEL
ALT: 34 IU/L — ABNORMAL HIGH (ref 0–32)
AST: 20 IU/L (ref 0–40)
Albumin/Globulin Ratio: 1.6 (ref 1.2–2.2)
Albumin: 4.4 g/dL (ref 3.9–4.9)
Alkaline Phosphatase: 81 IU/L (ref 44–121)
BUN/Creatinine Ratio: 21 (ref 12–28)
BUN: 16 mg/dL (ref 8–27)
Bilirubin Total: 0.4 mg/dL (ref 0.0–1.2)
CO2: 25 mmol/L (ref 20–29)
Calcium: 11.5 mg/dL — ABNORMAL HIGH (ref 8.7–10.3)
Chloride: 97 mmol/L (ref 96–106)
Creatinine, Ser: 0.75 mg/dL (ref 0.57–1.00)
Globulin, Total: 2.7 g/dL (ref 1.5–4.5)
Glucose: 140 mg/dL — ABNORMAL HIGH (ref 70–99)
Potassium: 3.9 mmol/L (ref 3.5–5.2)
Sodium: 137 mmol/L (ref 134–144)
Total Protein: 7.1 g/dL (ref 6.0–8.5)
eGFR: 88 mL/min/{1.73_m2} (ref >59)

## 2022-10-25 LAB — MICROALBUMIN / CREATININE URINE RATIO
Creatinine, Urine: 135.2 mg/dL
Microalb/Creat Ratio: 5 mg/g creat (ref 0–29)
Microalbumin, Urine: 7.1 ug/mL

## 2022-10-25 LAB — LIPID PANEL
Chol/HDL Ratio: 2.1 ratio (ref 0.0–4.4)
Cholesterol, Total: 134 mg/dL (ref 100–199)
HDL: 65 mg/dL (ref >39)
LDL Chol Calc (NIH): 49 mg/dL (ref 0–99)
Triglycerides: 111 mg/dL (ref 0–149)
VLDL Cholesterol Cal: 20 mg/dL (ref 5–40)

## 2022-10-25 LAB — HEMOGLOBIN A1C
Est. average glucose Bld gHb Est-mCnc: 160 mg/dL
Hgb A1c MFr Bld: 7.2 % — ABNORMAL HIGH (ref 4.8–5.6)

## 2022-10-27 NOTE — Progress Notes (Signed)
A1c remains stable at 7.2%. Continue to recommend balanced, lower carb meals. Smaller meal size, adding snacks. Choosing water as drink of choice and increasing purposeful exercise. All other labs are normal as well.

## 2022-10-27 NOTE — Telephone Encounter (Signed)
Requested Prescriptions  Pending Prescriptions Disp Refills   amLODipine (NORVASC) 10 MG tablet [Pharmacy Med Name: amLODIPine Besylate 10 MG Oral Tablet] 90 tablet 2    Sig: Take 1 tablet by mouth once daily     Cardiovascular: Calcium Channel Blockers 2 Passed - 10/25/2022  1:12 PM      Passed - Last BP in normal range    BP Readings from Last 1 Encounters:  10/24/22 129/72         Passed - Last Heart Rate in normal range    Pulse Readings from Last 1 Encounters:  10/24/22 70         Passed - Valid encounter within last 6 months    Recent Outpatient Visits           3 days ago Type 2 diabetes mellitus with other specified complication, without long-term current use of insulin [facility])   [facility] Tally Joe T, FNP   1 month ago Pain in both lower extremities   [facility], [place], PA-C   3 months ago 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 [facility])   [facility] Gwyneth Sprout, FNP   6 months ago Annual physical exam   [facility] Tally Joe T, FNP   9 months ago Controlled type 2 diabetes mellitus with hyperglycemia, without long-term current use of insulin [facility]   [facility] Gwyneth Sprout, [place]

## 2022-10-28 ENCOUNTER — Telehealth: Payer: Self-pay

## 2022-10-28 NOTE — Telephone Encounter (Signed)
Pt given lab results per notes of Daneil Dan, NP on 10/28/22. Pt verbalized understanding.

## 2022-11-12 ENCOUNTER — Other Ambulatory Visit: Payer: Self-pay | Admitting: Family Medicine

## 2022-12-10 ENCOUNTER — Telehealth: Payer: Self-pay

## 2022-12-10 NOTE — Progress Notes (Signed)
   Care Guide Note  12/10/2022 Name: Jodi Becker MRN: [id number] DOB: 07/18/1956  Referred by: Gwyneth Sprout, FNP Reason for referral : Care Coordination (Outreach to schedule with [facility] pharmacist )   Jodi Becker is a 67 y.o. year old female who is a primary care patient of Gwyneth Sprout, FNP. Jodi Becker was referred to the pharmacist for assistance related to DM.    An unsuccessful telephone outreach was attempted today to contact the patient who was referred to the pharmacy team for assistance with medication management. Additional attempts will be made to contact the patient.   Noreene Larsson, [place], [place] [address] Direct Dial: [phone] [email or web address]

## 2022-12-16 ENCOUNTER — Ambulatory Visit: Payer: HMO

## 2022-12-16 DIAGNOSIS — E1169 Type 2 diabetes mellitus with other specified complication: Secondary | ICD-10-CM

## 2022-12-16 DIAGNOSIS — E1159 Type 2 diabetes mellitus with other circulatory complications: Secondary | ICD-10-CM

## 2022-12-16 NOTE — Progress Notes (Signed)
Care Management & Coordination Services Pharmacy Note  12/16/2022 Name:  Jodi Becker MRN:  [id number] DOB:  1956/08/03  Summary: Patient presents for follow-up pharmacy consult.   -Fasting blood sugars mildly elevated today but A1c has remained stable. She has Started walking over the past 3 weeks. She is limited by muscle pain but is working through it. She is concerned it may be restless legs but does not want to start Becker new medication for it.   Recommendations/Changes made from today's visit: Continue current medications  Extensive Dietary counseling reviewed today.   Follow up plan: CPP follow-up 6 months   Subjective: Jodi Becker is an 67 y.o. year old female who is Becker primary patient of Jodi Becker, [place].  The care coordination team was consulted for assistance with disease management and care coordination needs.    Engaged with patient by telephone for follow up visit.  Recent office visits: 10/24/22: Patient presented to Jodi Joe, FNP for follow-up.   Recent consult visits: None in past 6 months.    Hospital visits: None in past 6 months.     Objective:  Lab Results  Component Value Date   CREATININE 0.75 10/24/2022   BUN 16 10/24/2022   EGFR 88 10/24/2022   GFRNONAA 77 09/17/2020   GFRAA 89 09/17/2020   NA 137 10/24/2022   K 3.9 10/24/2022   CALCIUM 11.5 (H) 10/24/2022   CO2 25 10/24/2022   GLUCOSE 140 (H) 10/24/2022    Lab Results  Component Value Date/Time   HGBA1C 7.2 (H) 10/24/2022 08:36 AM   HGBA1C 7.2 (Becker) 07/25/2022 08:23 AM   HGBA1C 8.3 (H) 04/23/2022 09:36 AM   MICROALBUR 50 03/13/2017 08:38 AM   MICROALBUR 50 01/14/2016 08:36 AM    Last diabetic Eye exam:  Lab Results  Component Value Date/Time   HMDIABEYEEXA No Retinopathy 09/22/2019 12:00 AM    Last diabetic Foot exam: No results found for: "HMDIABFOOTEX"   Lab Results  Component Value Date   CHOL 134 10/24/2022   HDL 65 10/24/2022   LDLCALC 49 10/24/2022   TRIG  111 10/24/2022   CHOLHDL 2.1 10/24/2022       Latest Ref Rng & Units 10/24/2022    8:36 AM 04/23/2022    9:36 AM 10/22/2021    9:02 AM  Hepatic Function  Total Protein 6.0 - 8.5 g/dL 7.1  6.8  7.2   Albumin 3.9 - 4.9 g/dL 4.4  4.3  4.8   AST 0 - 40 IU/L 20  24  37   ALT 0 - 32 IU/L 34  43  58   Alk Phosphatase 44 - 121 IU/L 81  111  101   Total Bilirubin 0.0 - 1.2 mg/dL 0.4  0.4  0.4     Lab Results  Component Value Date/Time   TSH 2.900 04/23/2022 09:36 AM   TSH 1.060 09/17/2020 08:09 AM   FREET4 1.19 04/23/2022 09:36 AM       Latest Ref Rng & Units 04/23/2022    9:36 AM 09/17/2020    8:09 AM 08/25/2019   12:04 PM  CBC  WBC 3.4 - 10.8 x10E3/uL 5.8  6.0  4.7   Hemoglobin 11.1 - 15.9 g/dL 13.0  12.9  13.4   Hematocrit 34.0 - 46.6 % 40.1  40.7  42.1   Platelets 150 - 450 x10E3/uL 195  227  205     No results found for: "VD25OH", "VITAMINB12"  Clinical ASCVD: No  The 10-year ASCVD  risk score (Arnett DK, et al., 2019) is: 15.3%   Values used to calculate the score:     Age: 67 years     Sex: Female     Is Non-Hispanic African American: Yes     Diabetic: Yes     Tobacco smoker: No     Systolic Blood Pressure: [id number] mmHg     Is BP treated: Yes     HDL Cholesterol: 65 mg/dL     Total Cholesterol: 134 mg/dL       10/24/2022    8:13 AM 07/25/2022    8:18 AM 04/23/2022    8:49 AM  Depression screen PHQ 2/9  Decreased Interest 0 0 0  Down, Depressed, Hopeless 0 0 0  PHQ - 2 Score 0 0 0  Altered sleeping 0 0 0  Tired, decreased energy 0 0 0  Change in appetite 0 0 0  Feeling bad or failure about yourself  0 0 0  Trouble concentrating 0 0 0  Moving slowly or fidgety/restless 0 0 0  Suicidal thoughts 0 0 0  PHQ-9 Score 0 0 0  Difficult doing work/chores Not difficult at all Not difficult at all Not difficult at all     Social History   Tobacco Use  Smoking Status Never  Smokeless Tobacco Never   BP Readings from Last 3 Encounters:  10/24/22 129/72  09/08/22  137/72  07/25/22 125/73   Pulse Readings from Last 3 Encounters:  10/24/22 70  09/08/22 80  07/25/22 75   Wt Readings from Last 3 Encounters:  10/24/22 255 lb (115.7 kg)  09/08/22 257 lb (116.6 kg)  07/25/22 263 lb (119.3 kg)   BMI Readings from Last 3 Encounters:  10/24/22 42.43 kg/m  09/08/22 44.11 kg/m  07/25/22 43.77 kg/m    Allergies  Allergen Reactions   Sulfa Antibiotics     Medications Reviewed Today     Reviewed by Jodi Sprout, FNP (Family Nurse Practitioner) on 10/24/22 at [phone]  Med List Status: <None>   Medication Order Taking? Sig Documenting Provider Last Dose Status Informant  amLODipine (NORVASC) 10 MG tablet [id number] Yes Take 1 tablet (10 mg total) by mouth daily. Jodi Sprout, FNP Taking Active   aspirin 81 MG chewable tablet [id number] Yes Chew by mouth. [provider] Taking Active            Med Note Jodi Becker, Jodi Becker   Mon Jan 27, 2022  1:28 PM)    b complex vitamins capsule [id number] Yes Take 1 capsule by mouth daily. [provider] Taking Active   Biotin 5000 MCG TABS [id number] Yes Take 2 tablets by mouth. [provider] Taking Active   blood glucose meter kit and supplies [id number] Yes Dispense based on patient and insurance preference. Use up to four times daily as directed. (FOR ICD-10 E10.9, E11.9). Jodi Sprout, FNP Taking Active   Cholecalciferol (VITAMIN D3) 1000 UNITS CAPS [id number] Yes Take 2,000 Units by mouth daily. [provider] Taking Active            Med Note Jodi Becker, Jodi Becker   Mon Jan 27, 2022  1:28 PM)    gabapentin (NEURONTIN) 100 MG capsule [id number] Yes Take 1 capsule (100 mg total) by mouth at bedtime. Jodi Sprout, FNP Taking Active   glipiZIDE (GLUCOTROL XL) 10 MG 24 hr tablet [id number] Yes Take 1 tablet (10 mg total) by mouth daily with breakfast. Jodi Sprout, FNP Taking Active  hydrochlorothiazide (HYDRODIURIL) 50 MG tablet [id number] Yes Take 1 tablet (50 mg total)  by mouth daily. Jodi Sprout, FNP Taking Active   Lancets [facility] Don[facility] Eva PLUS [id number]) MISC [id number] Yes USE TO CHECK BLOOD SUGAR UP TO 4 TIMES DAILY Jodi Sprout, FNP Taking Active   loratadine (CLARITIN) 10 MG tablet [id number] Yes Take 1 tablet by mouth once daily Jodi Sprout, FNP Taking Active   losartan (COZAAR) 100 MG tablet [id number] Yes Take 1 tablet by mouth once daily Jodi Sprout, FNP Taking Active   Magnesium 100 MG TABS [id number] Yes Take 100 mg by mouth daily. [provider] Taking Active   metFORMIN (GLUCOPHAGE) 500 MG tablet [id number] Yes Take 2 tablets by mouth twice daily Jodi Sprout, FNP Taking Active   [facility] ULTRA test strip [id number] Yes USE TO CHECK BLOOD SUGAR UP TO 4 TIMES DAILY Jodi Sprout, FNP Taking Active   pravastatin (PRAVACHOL) 40 MG tablet [id number] Yes Take 1 tablet by mouth once daily Jodi Sprout, FNP Taking Active   Semaglutide, 1 MG/DOSE, 4 MG/3ML SOPN [id number] Yes Inject 1 mg as directed once Becker week. Patient receives via [employer] Patient Assistance through Dec 2023 Jodi Becker T, FNP Taking Active   vitamin C (ASCORBIC ACID) 500 MG tablet [id number] Yes Take 500 mg by mouth daily. [provider] Taking Active             SDOH:  (Social Determinants of Health) assessments and interventions performed: Yes SDOH Interventions    Flowsheet Row Chronic Care Management from 01/27/2022 in [place]  SDOH Interventions   Transportation Interventions Intervention Not Indicated  Financial Strain Interventions Other (Comment)  [PAP]       Medication Assistance:  Ozempic obtained through [employer] medication assistance program.  Enrollment ends Dec 2024  Medication Access: Within the past 30 days, how often has patient missed Becker dose of medication? None Is Becker pillbox or other method used to improve adherence? Yes  Factors that may affect medication adherence? financial need Are  meds synced by current pharmacy? No  Are meds delivered by current pharmacy? No  Does patient experience delays in picking up medications due to transportation concerns? No   [facility] Services Reviewed: Is patient disadvantaged to use [facility]?: Yes  Current Rx insurance plan: HTA Name and location of Current pharmacy:  [facility] [address]), [place] - [place]) [place] [address] Phone: [phone] Fax: [phone]  [facility] - [place] [address], [place] [address] Phone: [phone] Fax: [phone]  [facility] services reviewed with patient today?: No  Patient requests to transfer care to [facility]?: No  Reason patient declined to change pharmacies: Disadvantaged due to insurance/mail order  Compliance/Adherence/Medication fill history: Care Gaps: Ophthalmology Exam  Covid Booster  Star-Rating Drugs: Glipizide XL 10 mg: Last filled 09/05/22 for 90-DS  Losartan 100 mg: Last filled 07/25/22 for 90-DS  Metformin 500 mg: Last filled 09/17/22 for 90-DS   Assessment/Plan  Hypertension (BP goal <130/80) -Controlled -Current treatment: Amlodipine 10 mg daily: Appropriate, Effective, Query Safe HCTZ 50 mg daily: Appropriate, Effective, Safe, Accessible Losartan 100 mg daily: Appropriate, Effective, Safe, Accessible  -Medications previously tried: Chlorthalidone  -Current home blood pressures: 115/69 pulse 71  -Continue current medications   Hyperlipidemia: (LDL goal < 70) -Controlled -Current treatment: Pravastatin 40 mg daily:  Appropriate, Effective, Safe, Accessible  -Medications previously tried: NA  -Recommended to continue current medication  Diabetes (A1c goal <7%) -Uncontrolled, but improving.  -Current medications: Ozempic 1 mg weekly:  Glipizide XL 10 mg daily: Appropriate, Query effective Metformin 500 mg  2 tablets twice daily: Appropriate, Query effective -Medications previously tried: Actos, Januvia, Tradjenta   -Current home glucose readings 164 127 185 150 167 141 168 152 146 -Primary goals are weight loss, limit overeating.  -Denies hypoglycemic/hyperglycemic symptoms -Current meal patterns: Struggles with portion control  -Current exercise: Started walking over the past 3 weeks. She is walking 3-4 laps (25 min of exercise) 2-3 times weekly. She is limited by muscle pain/cramping but is working through it. She is concerned it may be restless legs but does not want to start Becker new medication for it.  -Continue current medications to assess full impact of ozempic.   Allergic Rhinitis (Goal: Minimize symptoms) -Controlled -Current treatment  Flonase 50 mcg/act: Appropriate, Effective, Safe, Accessible  Loratadine 10 mg daily: Appropriate, Effective, Safe, Accessible  -Medications previously tried: NA  -Still has some breakthrough symptoms, much better control.  -Recommended to continue current medication  [name], PharmD, [place], [place] Pharmacist Practitioner  [facility] [phone]

## 2022-12-18 ENCOUNTER — Other Ambulatory Visit: Payer: Self-pay | Admitting: Family Medicine

## 2022-12-18 DIAGNOSIS — E1165 Type 2 diabetes mellitus with hyperglycemia: Secondary | ICD-10-CM

## 2022-12-18 NOTE — Telephone Encounter (Signed)
Requested Prescriptions  Pending Prescriptions Disp Refills   metFORMIN (GLUCOPHAGE) 500 MG tablet [Pharmacy Med Name: metFORMIN HCl 500 MG Oral Tablet] 360 tablet 0    Sig: Take 2 tablets by mouth twice daily     Endocrinology:  Diabetes - Biguanides Failed - 12/18/2022  9:11 AM      Failed - B12 Level in normal range and within 720 days    No results found for: "VITAMINB12"       Passed - Cr in normal range and within 360 days    Creatinine, Ser  Date Value Ref Range Status  10/24/2022 0.75 0.57 - 1.00 mg/dL Final         Passed - HBA1C is between 0 and 7.9 and within 180 days    Hgb A1c MFr Bld  Date Value Ref Range Status  10/24/2022 7.2 (H) 4.8 - 5.6 % Final    Comment:             Prediabetes: 5.7 - 6.4          Diabetes: >6.4          Glycemic control for adults with diabetes: <7.0          Passed - eGFR in normal range and within 360 days    GFR calc Af Amer  Date Value Ref Range Status  09/17/2020 89 >59 mL/min/1.73 Final    Comment:    **In accordance with recommendations from the NKF-ASN Task force,**   [facility] is in the process of updating its eGFR calculation to the   2021 CKD-EPI creatinine equation that estimates kidney function   without a race variable.    GFR calc non Af Amer  Date Value Ref Range Status  09/17/2020 77 >59 mL/min/1.73 Final   eGFR  Date Value Ref Range Status  10/24/2022 88 >59 mL/min/1.73 Final         Passed - Valid encounter within last 6 months    Recent Outpatient Visits           1 month ago Type 2 diabetes mellitus with other specified complication, without long-term current use of insulin ([place])   [place] Tally Joe T, FNP   3 months ago Pain in both lower extremities   [place], [place], PA-C   4 months ago 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 [facility])   [place] Gwyneth Sprout, FNP   7 months ago Annual physical  exam   [facility] Tally Joe T, FNP   11 months ago Controlled type 2 diabetes mellitus with hyperglycemia, without long-term current use of insulin [facility])   [place] Tally Joe T, FNP              Passed - CBC within normal limits and completed in the last 12 months    WBC  Date Value Ref Range Status  04/23/2022 5.8 3.4 - 10.8 x10E3/uL Final   RBC  Date Value Ref Range Status  04/23/2022 4.80 3.77 - 5.28 x10E6/uL Final   Hemoglobin  Date Value Ref Range Status  04/23/2022 13.0 11.1 - 15.9 g/dL Final   Hematocrit  Date Value Ref Range Status  04/23/2022 40.1 34.0 - 46.6 % Final   MCHC  Date Value Ref Range Status  04/23/2022 32.4 31.5 - 35.7 g/dL Final   [facility]  Date Value Ref Range Status  04/23/2022 27.1 26.6 -  33.0 pg Final   MCV  Date Value Ref Range Status  04/23/2022 84 79 - 97 fL Final   No results found for: "PLTCOUNTKUC", "LABPLAT", "POCPLA" RDW  Date Value Ref Range Status  04/23/2022 14.3 11.7 - 15.4 % Final

## 2023-01-10 ENCOUNTER — Other Ambulatory Visit: Payer: Self-pay | Admitting: Family Medicine

## 2023-01-10 DIAGNOSIS — E7800 Pure hypercholesterolemia, unspecified: Secondary | ICD-10-CM

## 2023-01-10 DIAGNOSIS — E1159 Type 2 diabetes mellitus with other circulatory complications: Secondary | ICD-10-CM

## 2023-01-15 ENCOUNTER — Other Ambulatory Visit: Payer: Self-pay | Admitting: Family Medicine

## 2023-01-15 ENCOUNTER — Telehealth: Payer: Self-pay

## 2023-01-15 DIAGNOSIS — E7800 Pure hypercholesterolemia, unspecified: Secondary | ICD-10-CM

## 2023-01-15 DIAGNOSIS — I10 Essential (primary) hypertension: Secondary | ICD-10-CM

## 2023-01-15 DIAGNOSIS — E1165 Type 2 diabetes mellitus with hyperglycemia: Secondary | ICD-10-CM

## 2023-01-15 DIAGNOSIS — E1169 Type 2 diabetes mellitus with other specified complication: Secondary | ICD-10-CM

## 2023-01-15 NOTE — Telephone Encounter (Signed)
Requested Prescriptions  Pending Prescriptions Disp Refills   loratadine (CLARITIN) 10 MG tablet [Pharmacy Med Name: Loratadine 10 MG Oral Tablet] 90 tablet 0    Sig: Take 1 tablet by mouth once daily     Ear, Nose, and Throat:  Antihistamines 2 Passed - 01/15/2023  8:34 AM      Passed - Cr in normal range and within 360 days    Creatinine, Ser  Date Value Ref Range Status  10/24/2022 0.75 0.57 - 1.00 mg/dL Final         Passed - Valid encounter within last 12 months    Recent Outpatient Visits           2 months ago Type 2 diabetes mellitus with other specified complication, without long-term current use of insulin [facility])   [place] Tally Joe T, FNP   4 months ago Pain in both lower extremities   [place], [place], PA-C   5 months ago 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 [facility])   [place] Gwyneth Sprout, FNP   8 months ago Annual physical exam   [facility] Tally Joe T, FNP   11 months ago Controlled type 2 diabetes mellitus with hyperglycemia, without long-term current use of insulin [facility])   [place] Gwyneth Sprout, [place]

## 2023-01-15 NOTE — Progress Notes (Signed)
Care Management & Coordination Services Pharmacy Team Pharmacy Assistant   Name: Jodi Becker  MRN: [id number] DOB: [birth year]  Reason for Encounter: Diabetes  Contacted patient to discuss diabetes disease state. Unsuccessful outreach. Left voicemail for patient to return call. On 04/04.  Contacted patient to discuss diabetes disease state. Spoke with patient on 01/16/2023   Chart Updates:  Recent office visits:  None ID  Recent consult visits:  None ID  Hospital visits:  None in previous 6 months  Medications: Outpatient Encounter Medications as of 01/15/2023  Medication Sig   amLODipine (NORVASC) 10 MG tablet Take 1 tablet by mouth once daily   aspirin 81 MG chewable tablet Chew by mouth.   b complex vitamins capsule Take 1 capsule by mouth daily.   Biotin 5000 MCG TABS Take 2 tablets by mouth.   blood glucose meter kit and supplies Dispense based on patient and insurance preference. Use up to four times daily as directed. (FOR ICD-10 E10.9, E11.9).   Cholecalciferol (VITAMIN D3) 1000 UNITS CAPS Take 2,000 Units by mouth daily.   gabapentin (NEURONTIN) 100 MG capsule Take 1 capsule (100 mg total) by mouth at bedtime.   glipiZIDE (GLUCOTROL XL) 10 MG 24 hr tablet Take 1 tablet (10 mg total) by mouth daily with breakfast.   hydrochlorothiazide (HYDRODIURIL) 50 MG tablet Take 1 tablet by mouth once daily   Lancets (ONETOUCH DELICA PLUS LANCET33G) MISC USE TO CHECK BLOOD SUGAR UP TO 4 TIMES DAILY   loratadine (CLARITIN) 10 MG tablet Take 1 tablet by mouth once daily   losartan (COZAAR) 100 MG tablet Take 1 tablet by mouth once daily   Magnesium 100 MG TABS Take 100 mg by mouth daily.   metFORMIN (GLUCOPHAGE) 500 MG tablet Take 2 tablets by mouth twice daily   ONETOUCH ULTRA test strip USE TO CHECK BLOOD SUGAR UP TO 4 TIMES DAILY   pravastatin (PRAVACHOL) 40 MG tablet Take 1 tablet by mouth once daily   Semaglutide, 1 MG/DOSE, 4 MG/3ML SOPN Inject 1 mg as directed once a  week. Patient receives via [employer] Patient Assistance through Dec 2023   vitamin C (ASCORBIC ACID) 500 MG tablet Take 500 mg by mouth daily.   No facility-administered encounter medications on file as of 01/15/2023.   Recent Relevant Labs: Lab Results  Component Value Date/Time   HGBA1C 7.2 (H) 10/24/2022 08:36 AM   HGBA1C 7.2 (A) 07/25/2022 08:23 AM   HGBA1C 8.3 (H) 04/23/2022 09:36 AM   MICROALBUR 50 03/13/2017 08:38 AM   MICROALBUR 50 01/14/2016 08:36 AM    Kidney Function Lab Results  Component Value Date/Time   CREATININE 0.75 10/24/2022 08:36 AM   CREATININE 0.68 04/23/2022 09:36 AM   GFRNONAA 77 09/17/2020 08:09 AM   GFRAA 89 09/17/2020 08:09 AM   Current antihyperglycemic regimen:  Ozempic 1 mg weekly Glipizide XL 10 mg daily Metformin 500 mg 2 tablets twice daily    Patient verbally confirms she is taking the above medications as directed. Yes  What diet changes have been made to improve diabetes control? None ID  What recent interventions/DTPs have been made to improve glycemic control:  None ID  Have there been any recent hospitalizations or ED visits since last visit with PharmD? No  Patient denies hypoglycemic symptoms, including Pale, Sweaty, Shaky, Hungry, Nervous/irritable, and Vision changes  Patient reports hyperglycemic symptoms, including fatigue when her blood sugars are elevated. Per patient this is one way she can tell her blood sugars are high.  How often are you checking your blood sugar? once daily  What are your blood sugars ranging?  Fasting: 148, 129, 184, 172, 137, 160, 171  During the week, how often does your blood glucose drop below 70? Never  Are you checking your feet daily/regularly? Yes  Adherence Review: Is the patient currently on a STATIN medication? Yes Is the patient currently on ACE/ARB medication? Yes Does the patient have >5 day gap between last estimated fill dates? Yes   Star Rating Drugs:  Glipizide 10 mg last  filled on 09/05/2022 for a 90-DS with [facility] Metformin 500 mg last filled on 09/17/2022 for a 90-DS with [facility] Losartan 100 mg last filled on 07/25/2022 for a 90-DS with [facility] Pravastatin 40 mg last filled on 07/18/2022 for a 90-DS with [facility] Ozempic 1 mg patient receives this medication via patient assistance with [employer]  Per [facility] patient picked up her Pravastatin 40 mg on 04/01, but is out of refills on Glipizide, Metformin, Losartan, and Pravastatin. I sent a task to CPP requesting for him to send these refills in today.  I spoke with the patient and she reports she is doing okay. Patient denies any ill symptoms at this time. Patient did request refills on her Amlodipine and Claritin. Patient has no other issues or concerns at this time.   Care Gaps: Annual wellness visit in last year? Yes Last eye exam / retinopathy screening: None ID Last diabetic foot exam: 04/23/2022  Adelene Idler, CPA/CMA Clinical Pharmacist Assistant Phone: [phone]

## 2023-01-16 MED ORDER — METFORMIN HCL 500 MG PO TABS: 1000.0000 mg | ORAL_TABLET | Freq: Two times a day (BID) | ORAL | 1 refills | Status: DC

## 2023-01-16 MED ORDER — PRAVASTATIN SODIUM 40 MG PO TABS
40.0000 mg | ORAL_TABLET | Freq: Every day | ORAL | 1 refills | Status: DC
Start: 2023-01-16 — End: 2023-12-25

## 2023-01-16 MED ORDER — GLIPIZIDE ER 10 MG PO TB24: 10.0000 mg | ORAL_TABLET | Freq: Every day | ORAL | 1 refills | Status: DC

## 2023-01-16 MED ORDER — LOSARTAN POTASSIUM 100 MG PO TABS: 100.0000 mg | ORAL_TABLET | Freq: Every day | ORAL | 1 refills | Status: DC

## 2023-01-16 NOTE — Addendum Note (Signed)
Addended by: Julious Payer A on: 01/16/2023 11:40 AM   Modules accepted: Orders

## 2023-02-04 DIAGNOSIS — H35033 Hypertensive retinopathy, bilateral: Secondary | ICD-10-CM | POA: Diagnosis not present

## 2023-02-12 ENCOUNTER — Telehealth: Payer: Self-pay

## 2023-02-12 NOTE — Progress Notes (Signed)
Care Management & Coordination Services Pharmacy Team Pharmacy Assistant   Name: Jodi Becker  MRN: [id number] DOB: January 27, 1956  Reason for Encounter: Diabetes  Contacted patient to discuss diabetes disease state. Unsuccessful outreach. Left voicemail for patient to return call. {US HC Outreach:[id number]} Chart Updates:  Recent office visits:  None ID  Recent consult visits:  None ID  Hospital visits:  None in previous 6 months  Medications: Outpatient Encounter Medications as of 02/12/2023  Medication Sig   amLODipine (NORVASC) 10 MG tablet Take 1 tablet by mouth once daily   aspirin 81 MG chewable tablet Chew by mouth.   b complex vitamins capsule Take 1 capsule by mouth daily.   Biotin 5000 MCG TABS Take 2 tablets by mouth.   blood glucose meter kit and supplies Dispense based on patient and insurance preference. Use up to four times daily as directed. (FOR ICD-10 E10.9, E11.9).   Cholecalciferol (VITAMIN D3) 1000 UNITS CAPS Take 2,000 Units by mouth daily.   gabapentin (NEURONTIN) 100 MG capsule Take 1 capsule (100 mg total) by mouth at bedtime.   glipiZIDE (GLUCOTROL XL) 10 MG 24 hr tablet Take 1 tablet (10 mg total) by mouth daily with breakfast.   hydrochlorothiazide (HYDRODIURIL) 50 MG tablet Take 1 tablet by mouth once daily   Lancets (ONETOUCH DELICA PLUS LANCET33G) MISC USE TO CHECK BLOOD SUGAR UP TO 4 TIMES DAILY   loratadine (CLARITIN) 10 MG tablet Take 1 tablet by mouth once daily   losartan (COZAAR) 100 MG tablet Take 1 tablet (100 mg total) by mouth daily.   Magnesium 100 MG TABS Take 100 mg by mouth daily.   metFORMIN (GLUCOPHAGE) 500 MG tablet Take 2 tablets (1,000 mg total) by mouth 2 (two) times daily.   ONETOUCH ULTRA test strip USE TO CHECK BLOOD SUGAR UP TO 4 TIMES DAILY   pravastatin (PRAVACHOL) 40 MG tablet Take 1 tablet (40 mg total) by mouth daily.   Semaglutide, 1 MG/DOSE, 4 MG/3ML SOPN Inject 1 mg as directed once a week. Patient receives via [employer] Patient Assistance through Dec 2023   vitamin C (ASCORBIC ACID) 500 MG tablet Take 500 mg by mouth daily.   No facility-administered encounter medications on file as of 02/12/2023.    Recent Relevant Labs: Lab Results  Component Value Date/Time   HGBA1C 7.2 (H) 10/24/2022 08:36 AM   HGBA1C 7.2 (A) 07/25/2022 08:23 AM   HGBA1C 8.3 (H) 04/23/2022 09:36 AM   MICROALBUR 50 03/13/2017 08:38 AM   MICROALBUR 50 01/14/2016 08:36 AM    Kidney Function Lab Results  Component Value Date/Time   CREATININE 0.75 10/24/2022 08:36 AM   CREATININE 0.68 04/23/2022 09:36 AM   GFRNONAA 77 09/17/2020 08:09 AM   GFRAA 89 09/17/2020 08:09 AM  Current antihyperglycemic regimen:  Ozempic 1 mg weekly Glipizide XL 10 mg daily Metformin 500 mg 2 tablets twice daily    Patient verbally confirms she is taking the above medications as directed. {yes/no:20286}  What diet changes have been made to improve diabetes control?  What recent interventions/DTPs have been made to improve glycemic control:  None ID  Have there been any recent hospitalizations or ED visits since last visit with PharmD? No  Patient {reports/denies:24182} hypoglycemic symptoms, including {Hypoglycemic Symptoms:3049003}  Patient {reports/denies:24182} hyperglycemic symptoms, including {symptoms; hyperglycemia:17903}  How often are you checking your blood sugar? {BG Testing frequency:23922}  What are your blood sugars ranging?  Fasting: *** Before meals: *** After meals: *** Bedtime: ***  During the  week, how often does your blood glucose drop below 70? {LowBGfrequency:24142}  Are you checking your feet daily/regularly? {yes/no:20286}  Adherence Review: Is the patient currently on a STATIN medication? {yes/no:20286} Is the patient currently on ACE/ARB medication? {yes/no:20286} Does the patient have >5 day gap between last estimated fill dates? {yes/no:20286}  Star Rating Drugs:  Glipizide 10 mg last filled on  09/05/2022 for a 90-DS with [facility] Metformin 500 mg last filled on 09/17/2022 for a 90-DS with [facility] Losartan 100 mg last filled on 07/25/2022 for a 90-DS with [facility] Pravastatin 40 mg last filled on 01/12/2023 for a 90-DS with [facility] Ozempic 1 mg patient receives this medication via patient assistance with [employer]   Care Gaps: Annual wellness visit in last year? Yes Last eye exam / retinopathy screening: None ID Last diabetic foot exam: 04/23/2022  Patient has follow-up with CPP on 06/12/2023 @ 0830.  Adelene Idler, CPA/CMA Clinical Pharmacist Assistant Phone: [phone]

## 2023-03-12 ENCOUNTER — Telehealth: Payer: Self-pay

## 2023-03-12 NOTE — Telephone Encounter (Signed)
Copied from CRM [id number]. Topic: General - Other >> Mar 12, 2023  1:29 PM Franchot Heidelberg wrote: Reason for CRM: Pt called to report that she has one week left of Ozempic, please advise

## 2023-03-13 ENCOUNTER — Other Ambulatory Visit: Payer: Self-pay

## 2023-03-13 DIAGNOSIS — E11628 Type 2 diabetes mellitus with other skin complications: Secondary | ICD-10-CM

## 2023-03-13 MED ORDER — SEMAGLUTIDE (1 MG/DOSE) 4 MG/3ML ~~LOC~~ SOPN: 1.0000 mg | PEN_INJECTOR | SUBCUTANEOUS | Status: DC

## 2023-03-16 ENCOUNTER — Telehealth: Payer: Self-pay

## 2023-03-16 DIAGNOSIS — E1169 Type 2 diabetes mellitus with other specified complication: Secondary | ICD-10-CM

## 2023-03-16 NOTE — Progress Notes (Signed)
Care Management & Coordination Services Pharmacy Team Pharmacy Assistant   Name: Jodi Becker  MRN: [id number] DOB: [birth year]  Reason for Encounter: Diabetes  Contacted patient to discuss diabetes disease state. Spoke with patient on 03/16/2023   Chart Updates:  Recent office visits:  None ID  Recent consult visits:  None ID  Hospital visits:  None in previous 6 months  Medications: Outpatient Encounter Medications as of 03/16/2023  Medication Sig   amLODipine (NORVASC) 10 MG tablet Take 1 tablet by mouth once daily   aspirin 81 MG chewable tablet Chew by mouth.   b complex vitamins capsule Take 1 capsule by mouth daily.   Biotin 5000 MCG TABS Take 2 tablets by mouth.   blood glucose meter kit and supplies Dispense based on patient and insurance preference. Use up to four times daily as directed. (FOR ICD-10 E10.9, E11.9).   Cholecalciferol (VITAMIN D3) 1000 UNITS CAPS Take 2,000 Units by mouth daily.   gabapentin (NEURONTIN) 100 MG capsule Take 1 capsule (100 mg total) by mouth at bedtime.   glipiZIDE (GLUCOTROL XL) 10 MG 24 hr tablet Take 1 tablet (10 mg total) by mouth daily with breakfast.   hydrochlorothiazide (HYDRODIURIL) 50 MG tablet Take 1 tablet by mouth once daily   Lancets (ONETOUCH DELICA PLUS LANCET33G) MISC USE TO CHECK BLOOD SUGAR UP TO 4 TIMES DAILY   loratadine (CLARITIN) 10 MG tablet Take 1 tablet by mouth once daily   losartan (COZAAR) 100 MG tablet Take 1 tablet (100 mg total) by mouth daily.   Magnesium 100 MG TABS Take 100 mg by mouth daily.   metFORMIN (GLUCOPHAGE) 500 MG tablet Take 2 tablets (1,000 mg total) by mouth 2 (two) times daily.   ONETOUCH ULTRA test strip USE TO CHECK BLOOD SUGAR UP TO 4 TIMES DAILY   pravastatin (PRAVACHOL) 40 MG tablet Take 1 tablet (40 mg total) by mouth daily.   Semaglutide, 1 MG/DOSE, 4 MG/3ML SOPN Inject 1 mg as directed once a week. Patient receives via [employer] Patient Assistance through Dec 2023   vitamin C  (ASCORBIC ACID) 500 MG tablet Take 500 mg by mouth daily.   No facility-administered encounter medications on file as of 03/16/2023.   Recent Relevant Labs: Lab Results  Component Value Date/Time   HGBA1C 7.2 (H) 10/24/2022 08:36 AM   HGBA1C 7.2 (A) 07/25/2022 08:23 AM   HGBA1C 8.3 (H) 04/23/2022 09:36 AM   MICROALBUR 50 03/13/2017 08:38 AM   MICROALBUR 50 01/14/2016 08:36 AM    Kidney Function Lab Results  Component Value Date/Time   CREATININE 0.75 10/24/2022 08:36 AM   CREATININE 0.68 04/23/2022 09:36 AM   GFRNONAA 77 09/17/2020 08:09 AM   GFRAA 89 09/17/2020 08:09 AM   Current antihyperglycemic regimen:  Ozempic 1 mg weekly Glipizide XL 10 mg daily Metformin 500 mg 2 tablets twice daily    Patient verbally confirms she is taking the above medications as directed. Yes  What recent interventions/DTPs have been made to improve glycemic control:  None ID  Have there been any recent hospitalizations or ED visits since last visit with PharmD? No  Patient denies hypoglycemic symptoms, including Pale, Sweaty, Shaky, Hungry, Nervous/irritable, and Vision changes  Patient denies hyperglycemic symptoms, including blurry vision, excessive thirst, fatigue, polyuria, and weakness  How often are you checking your blood sugar? once daily  What are your blood sugars ranging?  Fasting: 163, 137, 150, 152, 201, 153, 136  During the week, how often does your blood glucose  drop below 70? Never  Are you checking your feet daily/regularly? Yes  Adherence Review: Is the patient currently on a STATIN medication? Yes Is the patient currently on ACE/ARB medication? Yes Does the patient have >5 day gap between last estimated fill dates? No  Star Rating Drugs:  Glipizide 10 mg last filled on 12/04/2022 for a 90-DS with [facility] Metformin 500 mg last filled on 12/18/2022 for a 90-DS with [facility] Losartan 100 mg last filled on 01/16/2023 for a [facility]-DS [facility] Pravastatin 40 mg last filled on 01/12/2023 for a 90-DS with [facility] Ozempic 1 mg patient receives this medication via patient assistance from [employer]-Patient advised that her last dosage will be this Thursday. I contacted [employer] and spoke with Judeth Cornfield a representative with [employer] who confirmed that they started the process of shipping patients refill order on 03/12/2023. Per Judeth Cornfield it takes about 10-14 business days for the providers office to receive the shipment. Per Judeth Cornfield the shipment should arrive before her next dosage is needed on 06/13. I advised the patient to give me a call around the 11th or 12th if she had not received her new order. Patient verbalized understanding   Care Gaps: Annual wellness visit in last year? Yes next due 04/24/2023 Last eye exam / retinopathy screening: 12/10/202 Last diabetic foot exam:03/24/2022   Adelene Idler, CPA/CMA Clinical Pharmacist Assistant Phone: [phone]

## 2023-03-24 ENCOUNTER — Telehealth: Payer: Self-pay

## 2023-03-24 MED ORDER — OZEMPIC (1 MG/DOSE) 2 MG/1.5ML ~~LOC~~ SOPN
1.0000 mg | PEN_INJECTOR | SUBCUTANEOUS | 0 refills | Status: DC
Start: 2023-03-24 — End: 2023-04-29

## 2023-03-24 NOTE — Progress Notes (Cosign Needed)
Care Coordination Pharmacy Assistant   Name: Jodi Becker  MRN: [id number] DOB: Apr 23, 1956  Reason for Encounter: Patient Assistance Medication Coordination  Patient called to advise that she still has not received her shipment for Ozempic and she has no medication for her injection on Thursday.   I contacted [employer] and spoke with a representative who confirmed that they do have 14 business days to ship and they are still in that 14 business day window. I did inform the representative that the patient is all out of her medication, and if we go by their window she will miss this weeks injection.   I did ask for a voucher for the patient, but the representative informed me that they only do voucher if it's a shipping delay on their end. I did kindly inform the representative that since they processing was not done in a timely manner and the patient is currently out of her medication this would seem to fall in a delay on their end. The representative spoke with her supervisor who approved a voucher for the patient to pick up a 30-DS at her pharmacy.  BIN: [id number] PCN: [id number] GRP: [id number] Voucher ID: [id number] Medication: Ozempic 1 mg/mL (1 pens x 53mL/pen)  CPP sent in prescription for a 30-DS of Ozempic 1 mg, and above voucher information was provided to the pharmacy. Pharmacy confirmed that they were able to process prescription voucher and their will be no cost to the patient.  Patient advised of the updated information.   Medications: Outpatient Encounter Medications as of 03/24/2023  Medication Sig   amLODipine (NORVASC) 10 MG tablet Take 1 tablet by mouth once daily   aspirin 81 MG chewable tablet Chew by mouth.   b complex vitamins capsule Take 1 capsule by mouth daily.   Biotin 5000 MCG TABS Take 2 tablets by mouth.   blood glucose meter kit and supplies Dispense based on patient and insurance preference. Use up to four times daily as directed. (FOR ICD-10 E10.9, E11.9).    Cholecalciferol (VITAMIN D3) 1000 UNITS CAPS Take 2,000 Units by mouth daily.   gabapentin (NEURONTIN) 100 MG capsule Take 1 capsule (100 mg total) by mouth at bedtime.   glipiZIDE (GLUCOTROL XL) 10 MG 24 hr tablet Take 1 tablet (10 mg total) by mouth daily with breakfast.   hydrochlorothiazide (HYDRODIURIL) 50 MG tablet Take 1 tablet by mouth once daily   Lancets (ONETOUCH DELICA PLUS LANCET33G) MISC USE TO CHECK BLOOD SUGAR UP TO 4 TIMES DAILY   loratadine (CLARITIN) 10 MG tablet Take 1 tablet by mouth once daily   losartan (COZAAR) 100 MG tablet Take 1 tablet (100 mg total) by mouth daily.   Magnesium 100 MG TABS Take 100 mg by mouth daily.   metFORMIN (GLUCOPHAGE) 500 MG tablet Take 2 tablets (1,000 mg total) by mouth 2 (two) times daily.   ONETOUCH ULTRA test strip USE TO CHECK BLOOD SUGAR UP TO 4 TIMES DAILY   pravastatin (PRAVACHOL) 40 MG tablet Take 1 tablet (40 mg total) by mouth daily.   Semaglutide, 1 MG/DOSE, 4 MG/3ML SOPN Inject 1 mg as directed once a week. Patient receives via [employer] Patient Assistance through Dec 2023   vitamin C (ASCORBIC ACID) 500 MG tablet Take 500 mg by mouth daily.   No facility-administered encounter medications on file as of 03/24/2023.   Adelene Idler, CPA/CMA Clinical Pharmacist Assistant Phone: [phone]

## 2023-03-24 NOTE — Addendum Note (Signed)
Addended by: Julious Payer A on: 03/24/2023 11:47 AM   Modules accepted: Orders

## 2023-04-07 ENCOUNTER — Other Ambulatory Visit: Payer: Self-pay | Admitting: Family Medicine

## 2023-04-07 DIAGNOSIS — E1165 Type 2 diabetes mellitus with hyperglycemia: Secondary | ICD-10-CM

## 2023-04-07 NOTE — Telephone Encounter (Signed)
Medication Refill - Medication: blood glucose meter kit and supplies  Has the patient contacted their pharmacy? No.   Patient stated her glucose meter & supplies has broke, she stated the part that you put the needle in has broke and needs this replaced.   Preferred Pharmacy (with phone number or street name): [facility] [address] [place]), [place] - [address]  Phone: [phone] Fax: [phone]  Has the patient been seen for an appointment in the last year OR does the patient have an upcoming appointment? Yes.  Upcoming MEDICARE AWV on 04/28/23  Patients callback #: [phone]

## 2023-04-08 MED ORDER — BLOOD GLUCOSE METER KIT
PACK | 0 refills | Status: DC
Start: 2023-04-08 — End: 2023-04-14

## 2023-04-08 NOTE — Telephone Encounter (Signed)
Requested Prescriptions  Pending Prescriptions Disp Refills   blood glucose meter kit and supplies 1 each 0    Sig: Dispense based on patient and insurance preference. Use up to four times daily as directed. (FOR ICD-10 E10.9, E11.9).     Endocrinology: Diabetes - Testing Supplies Passed - 04/07/2023  5:22 PM      Passed - Valid encounter within last 12 months    Recent Outpatient Visits           5 months ago Type 2 diabetes mellitus with other specified complication, without long-term current use of insulin [facility])   [place] [facility] Merita Norton T, FNP   7 months ago Pain in both lower extremities   [place] [facility] [place] [place], PA-C   8 months ago 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 [facility])   [place] [facility] Jacky Kindle, FNP   11 months ago Annual physical exam   [place] [facility] Merita Norton T, FNP   1 year ago Controlled type 2 diabetes mellitus with hyperglycemia, without long-term current use of insulin [facility])   [facility] Jacky Kindle, [place]

## 2023-04-10 NOTE — Telephone Encounter (Signed)
[facility] [address] [place]), [place] - [address]    Phone:                                                         [phone] Fax:                                                             [phone]  Is calling to follow up on Blood Glucose meter kit and supplies one touch delica.

## 2023-04-13 ENCOUNTER — Telehealth: Payer: Self-pay

## 2023-04-13 NOTE — Telephone Encounter (Signed)
Copied from CRM [id number]. Topic: General - Other >> Apr 13, 2023 12:24 PM Dondra Prader E wrote: Reason for CRM: Pt needs her order for her glucose monitoring kit e-scribed instead of printed in the office. Please advise

## 2023-04-14 ENCOUNTER — Other Ambulatory Visit: Payer: Self-pay

## 2023-04-14 DIAGNOSIS — E1165 Type 2 diabetes mellitus with hyperglycemia: Secondary | ICD-10-CM

## 2023-04-14 MED ORDER — BLOOD GLUCOSE METER KIT
PACK | 0 refills | Status: DC
Start: 2023-04-14 — End: 2023-04-14

## 2023-04-14 MED ORDER — BLOOD GLUCOSE METER KIT
1.0000 | PACK | Freq: Every day | 0 refills | Status: DC
Start: 2023-04-14 — End: 2023-04-14

## 2023-04-15 ENCOUNTER — Other Ambulatory Visit: Payer: Self-pay | Admitting: Family Medicine

## 2023-04-15 DIAGNOSIS — E1165 Type 2 diabetes mellitus with hyperglycemia: Secondary | ICD-10-CM

## 2023-04-15 MED ORDER — BLOOD GLUCOSE METER KIT
1.0000 | PACK | Freq: Every day | 0 refills | Status: DC
Start: 2023-04-15 — End: 2023-12-10

## 2023-04-15 MED ORDER — BLOOD GLUCOSE MONITORING SUPPL DEVI: 1.0000 | Freq: Three times a day (TID) | 0 refills | Status: DC

## 2023-04-15 MED ORDER — BLOOD GLUCOSE TEST VI STRP: 1.0000 | ORAL_STRIP | Freq: Three times a day (TID) | 0 refills | Status: AC

## 2023-04-15 MED ORDER — LANCET DEVICE MISC: 1.0000 | Freq: Three times a day (TID) | 0 refills | Status: AC

## 2023-04-15 MED ORDER — LANCETS MISC. MISC: 1.0000 | Freq: Three times a day (TID) | 0 refills | Status: AC

## 2023-04-23 ENCOUNTER — Other Ambulatory Visit: Payer: Self-pay | Admitting: Family Medicine

## 2023-04-23 NOTE — Telephone Encounter (Signed)
Not in medication pt list. Please advise

## 2023-04-28 ENCOUNTER — Encounter: Payer: Self-pay | Admitting: Family Medicine

## 2023-04-28 ENCOUNTER — Ambulatory Visit (INDEPENDENT_AMBULATORY_CARE_PROVIDER_SITE_OTHER): Payer: HMO | Admitting: Family Medicine

## 2023-04-28 DIAGNOSIS — Z0000 Encounter for general adult medical examination without abnormal findings: Secondary | ICD-10-CM

## 2023-04-28 DIAGNOSIS — I152 Hypertension secondary to endocrine disorders: Secondary | ICD-10-CM

## 2023-04-28 DIAGNOSIS — E785 Hyperlipidemia, unspecified: Secondary | ICD-10-CM

## 2023-04-28 DIAGNOSIS — Z7985 Long-term (current) use of injectable non-insulin antidiabetic drugs: Secondary | ICD-10-CM

## 2023-04-28 DIAGNOSIS — E1169 Type 2 diabetes mellitus with other specified complication: Secondary | ICD-10-CM | POA: Diagnosis not present

## 2023-04-28 DIAGNOSIS — Z6841 Body Mass Index (BMI) 40.0 and over, adult: Secondary | ICD-10-CM | POA: Diagnosis not present

## 2023-04-28 DIAGNOSIS — E1159 Type 2 diabetes mellitus with other circulatory complications: Secondary | ICD-10-CM

## 2023-04-28 DIAGNOSIS — E1165 Type 2 diabetes mellitus with hyperglycemia: Secondary | ICD-10-CM

## 2023-04-28 DIAGNOSIS — E11628 Type 2 diabetes mellitus with other skin complications: Secondary | ICD-10-CM

## 2023-04-28 DIAGNOSIS — L84 Corns and callosities: Secondary | ICD-10-CM

## 2023-04-28 DIAGNOSIS — R238 Other skin changes: Secondary | ICD-10-CM | POA: Diagnosis not present

## 2023-04-28 MED ORDER — AQUAPHOR EX OINT
TOPICAL_OINTMENT | CUTANEOUS | 0 refills | Status: AC | PRN
Start: 2023-04-28 — End: ?

## 2023-04-28 NOTE — Patient Instructions (Signed)
Rx sent for moisturizing cream; recommend GoldBond in place of A&D for further skin issues, use every other day in place of daily.

## 2023-04-28 NOTE — Assessment & Plan Note (Signed)
Encourage stop A&D; start water based cream  Continue to monitor friction sources and hygiene given obesity/pannus

## 2023-04-28 NOTE — Assessment & Plan Note (Signed)
Chronic; worsening Body mass index is 41.8 kg/m. Discussed importance of healthy weight management Discussed diet and exercise

## 2023-04-28 NOTE — Assessment & Plan Note (Signed)
Chronic; at goal Continue to monitor for hypotension Continue norvasc 10, ASA 81, hydrochlorothiazide 50, Losartan 100

## 2023-04-28 NOTE — Assessment & Plan Note (Signed)
Foot callus noted; foot exam completed Continues to see outpatient foot care providers vs podiatry Continue to monitor A1c and concern for worsening callus development; currently seeing providers monthly

## 2023-04-28 NOTE — Assessment & Plan Note (Signed)
Chronic; goal <7% Previously elevated Patient agreeable to titrate ozempic if elevated today from 1 to 2 mg Continue to recommend balanced, lower carb meals. Smaller meal size, adding snacks. Choosing water as drink of choice and increasing purposeful exercise. Previously on ozempic 1 mg; metformin 1000 mg BID, glipizide 10 mg

## 2023-04-28 NOTE — Assessment & Plan Note (Signed)

## 2023-04-28 NOTE — Assessment & Plan Note (Signed)
Eye exam sent for DM foot exam completed Continue to monitor vaccines Recommend labs Encourage scheduling appt for mammo; declines breast exam in office

## 2023-04-28 NOTE — Progress Notes (Signed)
Annual Wellness Visit     Patient: Jodi Becker, Female    DOB: 1956-02-20, 67 y.o.   MRN: [id number] Visit Date: 04/28/2023  Today's Provider: Jacky Kindle, FNP  Introduced to nurse practitioner role and practice setting.  All questions answered.  Discussed provider/patient relationship and expectations.  Chief Complaint  Patient presents with   Annual Exam    Patient reports itching on right inner thigh for about 2 months. States she tried using a baby rash ointment and it helps with the itching for a few hours but comes right back. Reports she experiences this every year when it begins to get warm.   Subjective    Jodi Becker is a 67 y.o. female who presents today for her Annual Wellness Visit. She reports consuming a general diet. The patient does not participate in regular exercise at present. She generally feels fairly well. She reports sleeping fairly well. She does have additional problems to discuss today.   HPI    Medications: Outpatient Medications Prior to Visit  Medication Sig   amLODipine (NORVASC) 10 MG tablet Take 1 tablet by mouth once daily   aspirin 81 MG chewable tablet Chew by mouth.   b complex vitamins capsule Take 1 capsule by mouth daily.   Biotin 5000 MCG TABS Take 2 tablets by mouth.   blood glucose meter kit and supplies 1 each by Other route daily. Dispense based on patient and insurance preference. Use up to four times daily as directed. FOR ICD-10 E10.9, E11.9.   Blood Glucose Monitoring Suppl DEVI 1 each by Does not apply route in the morning, at noon, and at bedtime. May substitute to any manufacturer covered by patient's insurance.   Cholecalciferol (VITAMIN D3) 1000 UNITS CAPS Take 2,000 Units by mouth daily.   EQ ALL DAY ALLERGY RELIEF 10 MG tablet Take 1 tablet by mouth once daily   gabapentin (NEURONTIN) 100 MG capsule Take 1 capsule (100 mg total) by mouth at bedtime.   glipiZIDE (GLUCOTROL XL) 10 MG 24 hr tablet Take 1  tablet (10 mg total) by mouth daily with breakfast.   Glucose Blood (BLOOD GLUCOSE TEST STRIPS) STRP 1 each by In Vitro route in the morning, at noon, and at bedtime. May substitute to any manufacturer covered by patient's insurance.   hydrochlorothiazide (HYDRODIURIL) 50 MG tablet Take 1 tablet by mouth once daily   Lancet Device MISC 1 each by Does not apply route in the morning, at noon, and at bedtime. May substitute to any manufacturer covered by patient's insurance.   Lancets (ONETOUCH DELICA PLUS LANCET33G) MISC USE TO CHECK BLOOD SUGAR UP TO 4 TIMES DAILY   Lancets Misc. MISC 1 each by Does not apply route in the morning, at noon, and at bedtime. May substitute to any manufacturer covered by patient's insurance.   losartan (COZAAR) 100 MG tablet Take 1 tablet (100 mg total) by mouth daily.   Magnesium 100 MG TABS Take 100 mg by mouth daily.   metFORMIN (GLUCOPHAGE) 500 MG tablet Take 2 tablets (1,000 mg total) by mouth 2 (two) times daily.   ONETOUCH ULTRA test strip USE TO CHECK BLOOD SUGAR UP TO 4 TIMES DAILY   pravastatin (PRAVACHOL) 40 MG tablet Take 1 tablet (40 mg total) by mouth daily.   Semaglutide, 1 MG/DOSE, (OZEMPIC, 1 MG/DOSE,) 2 MG/1.5ML SOPN Inject 1 mg into the skin once a week.   Semaglutide, 1 MG/DOSE, 4 MG/3ML SOPN Inject 1 mg as directed  once a week. Patient receives via [employer] Patient Assistance through Dec 2023   vitamin C (ASCORBIC ACID) 500 MG tablet Take 500 mg by mouth daily.   No facility-administered medications prior to visit.    Allergies  Allergen Reactions   Sulfa Antibiotics     Patient Care Team: Jacky Kindle, FNP as PCP - General (Family Medicine) Gaspar Cola, [facility] (Inactive) (Pharmacist)  Review of Systems  Last CBC Lab Results  Component Value Date   WBC 5.8 04/23/2022   HGB 13.0 04/23/2022   HCT 40.1 04/23/2022   MCV 84 04/23/2022   MCH 27.1 04/23/2022   RDW 14.3 04/23/2022   PLT 195 04/23/2022   Last metabolic  panel Lab Results  Component Value Date   GLUCOSE 140 (H) 10/24/2022   NA 137 10/24/2022   K 3.9 10/24/2022   CL 97 10/24/2022   CO2 25 10/24/2022   BUN 16 10/24/2022   CREATININE 0.75 10/24/2022   EGFR 88 10/24/2022   CALCIUM 11.5 (H) 10/24/2022   PROT 7.1 10/24/2022   ALBUMIN 4.4 10/24/2022   LABGLOB 2.7 10/24/2022   AGRATIO 1.6 10/24/2022   BILITOT 0.4 10/24/2022   ALKPHOS 81 10/24/2022   AST 20 10/24/2022   ALT 34 (H) 10/24/2022   Last lipids Lab Results  Component Value Date   CHOL 134 10/24/2022   HDL 65 10/24/2022   LDLCALC 49 10/24/2022   TRIG 111 10/24/2022   CHOLHDL 2.1 10/24/2022   Last hemoglobin A1c Lab Results  Component Value Date   HGBA1C 7.2 (H) 10/24/2022   Last thyroid functions Lab Results  Component Value Date   TSH 2.900 04/23/2022   Last vitamin D No results found for: "25OHVITD2", "25OHVITD3", "VD25OH" Last vitamin B12 and Folate No results found for: "VITAMINB12", "FOLATE"         Objective    Vitals: BP 95/66 (BP Location: Left Arm, Patient Position: Sitting, Cuff Size: Large)   Pulse 70   Ht 5\' 5"  (1.651 m)   Wt 251 lb 3.2 oz (113.9 kg)   SpO2 100%   BMI 41.80 kg/m  BP Readings from Last 3 Encounters:  04/28/23 95/66  10/24/22 129/72  09/08/22 137/72   Wt Readings from Last 3 Encounters:  04/28/23 251 lb 3.2 oz (113.9 kg)  10/24/22 255 lb (115.7 kg)  09/08/22 257 lb (116.6 kg)   SpO2 Readings from Last 3 Encounters:  04/28/23 100%  10/24/22 100%  09/08/22 99%          Physical Exam Vitals and nursing note reviewed.  Constitutional:      General: She is not in acute distress.    Appearance: Normal appearance. She is obese. She is not ill-appearing, toxic-appearing or diaphoretic.  HENT:     Head: Normocephalic and atraumatic.  Cardiovascular:     Rate and Rhythm: Normal rate and regular rhythm.     Pulses:          Dorsalis pedis pulses are 1+ on the right side and 1+ on the left side.       Posterior  tibial pulses are 1+ on the right side and 1+ on the left side.     Heart sounds: Normal heart sounds. No murmur heard.    No friction rub. No gallop.  Pulmonary:     Effort: Pulmonary effort is normal. No respiratory distress.     Breath sounds: Normal breath sounds. No stridor. No wheezing, rhonchi or rales.  Chest:  Chest wall: No tenderness.  Abdominal:     General: Bowel sounds are normal.     Palpations: Abdomen is soft.  Musculoskeletal:        General: No swelling, tenderness, deformity or signs of injury. Normal range of motion.     Right lower leg: No edema.     Left lower leg: No edema.  Feet:     Right foot:     Protective Sensation: 10 sites tested.  10 sites sensed.     Skin integrity: Callus and dry skin present.     Toenail Condition: Right toenails are abnormally thick. Fungal disease present.    Left foot:     Protective Sensation: 10 sites tested.  10 sites sensed.     Skin integrity: Callus and dry skin present.     Toenail Condition: Left toenails are abnormally thick. Fungal disease present. Skin:    General: Skin is warm and dry.     Capillary Refill: Capillary refill takes less than 2 seconds.     Coloration: Skin is not jaundiced or pale.     Findings: No bruising, erythema, lesion or rash.  Neurological:     General: No focal deficit present.     Mental Status: She is alert and oriented to person, place, and time. Mental status is at baseline.     Cranial Nerves: No cranial nerve deficit.     Sensory: No sensory deficit.     Motor: No weakness.     Coordination: Coordination normal.  Psychiatric:        Mood and Affect: Mood normal.        Behavior: Behavior normal.        Thought Content: Thought content normal.        Judgment: Judgment normal.    Most recent functional status assessment:    10/24/2022    8:14 AM  In your present state of health, do you have any difficulty performing the following activities:  Hearing? 0  Vision? 0   Difficulty concentrating or making decisions? 0  Walking or climbing stairs? 0  Dressing or bathing? 0  Doing errands, shopping? 0   Most recent fall risk assessment:    10/24/2022    8:14 AM  Fall Risk   Falls in the past year? 0  Number falls in past yr: 0  Injury with Fall? 0  Risk for fall due to : No Fall Risks  Follow up Falls evaluation completed    Most recent depression screenings:    10/24/2022    8:13 AM 07/25/2022    8:18 AM  PHQ 2/9 Scores  PHQ - 2 Score 0 0  PHQ- 9 Score 0 0   Most recent cognitive screening:     No data to display         Most recent Audit-C alcohol use screening    10/24/2022    8:14 AM  Alcohol Use Disorder Test (AUDIT)  1. How often do you have a drink containing alcohol? 0  3. How often do you have six or more drinks on one occasion? 0   A score of 3 or more in women, and 4 or more in men indicates increased risk for alcohol abuse, EXCEPT if all of the points are from question 1   No results found for any visits on 04/28/23.  Assessment & Plan     Annual wellness visit done today including the all of the following: Reviewed patient's Family Medical History  Reviewed and updated list of patient's medical providers Assessment of cognitive impairment was done Assessed patient's functional ability Established a written schedule for health screening services Health Risk Assessent Completed and Reviewed  Exercise Activities and Dietary recommendations  Goals      Monitor and Manage My Blood Sugar-Diabetes Type 2     Timeframe:  Long-Range Goal Priority:  High Start Date: 01/27/22                            Expected End Date: 01/28/23                      Follow Up within 30 days   - check blood sugar daily before breakfast - check blood sugar if I feel it is too high or too low - enter blood sugar readings and medication or insulin into daily log - take the blood sugar log to all doctor visits    Why is this important?    Checking your blood sugar at home helps to keep it from getting very high or very low.  Writing the results in a diary or log helps the doctor know how to care for you.  Your blood sugar log should have the time, date and the results.  Also, write down the amount of insulin or other medicine that you take.  Other information, like what you ate, exercise done and how you were feeling, will also be helpful.     Notes:      Track and Manage My Blood Pressure-Hypertension     Timeframe:  Long-Range Goal Priority:  High Start Date: 01/27/22                            Expected End Date: 01/28/23                      Follow Up within 30 days   - check blood pressure weekly - write blood pressure results in a log or diary    Why is this important?   You won't feel high blood pressure, but it can still hurt your blood vessels.  High blood pressure can cause heart or kidney problems. It can also cause a stroke.  Making lifestyle changes like losing a little weight or eating less salt will help.  Checking your blood pressure at home and at different times of the day can help to control blood pressure.  If the doctor prescribes medicine remember to take it the way the doctor ordered.  Call the office if you cannot afford the medicine or if there are questions about it.     Notes:         Immunization History  Administered Date(s) Administered   Influenza,inj,Quad PF,6+ Mos 07/09/2015   Influenza-Unspecified 07/22/2016, 07/27/2017   Moderna Covid-19 Vaccine Bivalent Booster 38yrs & up 09/04/2021   Moderna Sars-Covid-2 Vaccination 11/01/2019, 11/29/2019, 09/05/2020   PNEUMOCOCCAL CONJUGATE-20 10/22/2021   Pneumococcal-Unspecified 12/05/2009   Tdap 10/22/2021   Zoster Recombinant(Shingrix) 10/28/2021, 03/22/2022   Zoster, Live 07/22/2016    Health Maintenance  Topic Date Due   OPHTHALMOLOGY EXAM  09/21/2020   COVID-19 Vaccine (5 - 2023-24 season) 06/13/2022   HEMOGLOBIN A1C   04/24/2023   INFLUENZA VACCINE  05/14/2023   Diabetic kidney evaluation - eGFR measurement  10/25/2023   Diabetic kidney evaluation - Urine ACR  10/25/2023  FOOT EXAM  04/27/2024   Medicare Annual Wellness (AWV)  04/27/2024   MAMMOGRAM  06/30/2024   Colonoscopy  08/06/2024   DTaP/Tdap/Td (2 - Td or Tdap) 10/23/2031   Pneumonia Vaccine 45+ Years old  Completed   DEXA SCAN  Completed   Hepatitis C Screening  Completed   Zoster Vaccines- Shingrix  Completed   HPV VACCINES  Aged Out     Discussed health benefits of physical activity, and encouraged her to engage in regular exercise appropriate for her age and condition.    Problem List Items Addressed This Visit       Cardiovascular and Mediastinum   Hypertension associated with diabetes (HCC)    Chronic; at goal Continue to monitor for hypotension Continue norvasc 10, ASA 81, hydrochlorothiazide 50, Losartan 100      Relevant Orders   Comprehensive metabolic panel   CBC with Differential/Platelet     Endocrine   Diabetes mellitus (HCC)    Chronic; goal <7% Previously elevated Patient agreeable to titrate ozempic if elevated today from 1 to 2 mg Continue to recommend balanced, lower carb meals. Smaller meal size, adding snacks. Choosing water as drink of choice and increasing purposeful exercise. 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)    Foot callus noted; foot exam completed Continues to see outpatient foot care providers vs podiatry Continue to monitor A1c and concern for worsening callus development; currently seeing providers monthly         Musculoskeletal and Integument   Dry skin of abdomen    Encourage stop A&D; start water based cream  Continue to monitor friction sources and hygiene given  obesity/pannus       Relevant Medications   mineral oil-hydrophilic petrolatum (AQUAPHOR) ointment   Foot callus   Relevant Medications   mineral oil-hydrophilic petrolatum (AQUAPHOR) ointment     Other   Annual physical exam    Things to do to keep yourself healthy  - Exercise at least 30-45 minutes a day, 3-4 days a week.  - Eat a low-fat diet with lots of fruits and vegetables, up to 7-9 servings per day.  - Seatbelts can save your life. Wear them always.  - Smoke detectors on every level of your home, check batteries every year.  - Eye Doctor - have an eye exam every 1-2 years  - Safe sex - if you may be exposed to STDs, use a condom.  - Alcohol -  If you drink, do it moderately, less than 2 drinks per day.  - Health Care Power of Attorney. Choose someone to speak for you if you are not able.  - Depression is common in our stressful world.If you're feeling down or losing interest in things you normally enjoy, please come in for a visit.  - Violence - If anyone is threatening or hurting you, please call immediately.       Relevant Medications   mineral oil-hydrophilic petrolatum (AQUAPHOR) ointment   Other Relevant Orders   HgB A1c   Lipid Profile   Comprehensive metabolic panel   CBC with Differential/Platelet   MM 3D SCREENING MAMMOGRAM BILATERAL BREAST   Ambulatory referral to Gastroenterology   Medicare annual wellness visit, subsequent - Primary    Eye  exam sent for DM foot exam completed Continue to monitor vaccines Recommend labs Encourage scheduling appt for mammo; declines breast exam in office       Relevant Orders   MM 3D SCREENING MAMMOGRAM BILATERAL BREAST   Ambulatory referral to Gastroenterology   Morbid obesity (HCC)    Chronic; worsening Body mass index is 41.8 kg/m. Discussed importance of healthy weight management Discussed diet and exercise       Return in about 6 months (around 10/29/2023) for chonic disease management.    Leilani Merl, FNP, have reviewed all documentation for this visit. The documentation on 04/28/23 for the exam, diagnosis, procedures, and orders are all accurate and complete.  Jacky Kindle, FNP  [facility] [phone] (phone) [phone] (fax)  [facility]

## 2023-04-29 ENCOUNTER — Telehealth: Payer: Self-pay | Admitting: Family Medicine

## 2023-04-29 ENCOUNTER — Other Ambulatory Visit: Payer: Self-pay | Admitting: Family Medicine

## 2023-04-29 LAB — CBC WITH DIFFERENTIAL/PLATELET
Basophils Absolute: 0 10*3/uL (ref 0.0–0.2)
Basos: 1 %
EOS (ABSOLUTE): 0.3 10*3/uL (ref 0.0–0.4)
Eos: 5 %
Hematocrit: 41 % (ref 34.0–46.6)
Hemoglobin: 13.3 g/dL (ref 11.1–15.9)
Immature Grans (Abs): 0 10*3/uL (ref 0.0–0.1)
Immature Granulocytes: 0 %
Lymphocytes Absolute: 1.8 10*3/uL (ref 0.7–3.1)
Lymphs: 32 %
MCH: 27.5 pg (ref 26.6–33.0)
MCHC: 32.4 g/dL (ref 31.5–35.7)
MCV: 85 fL (ref 79–97)
Monocytes Absolute: 0.4 10*3/uL (ref 0.1–0.9)
Monocytes: 8 %
Neutrophils Absolute: 2.9 10*3/uL (ref 1.4–7.0)
Neutrophils: 54 %
Platelets: 214 10*3/uL (ref 150–450)
RBC: 4.83 x10E6/uL (ref 3.77–5.28)
RDW: 13.6 % (ref 11.7–15.4)
WBC: 5.4 10*3/uL (ref 3.4–10.8)

## 2023-04-29 LAB — COMPREHENSIVE METABOLIC PANEL
ALT: 44 IU/L — ABNORMAL HIGH (ref 0–32)
AST: 23 IU/L (ref 0–40)
Albumin: 4.3 g/dL (ref 3.9–4.9)
Alkaline Phosphatase: 75 IU/L (ref 44–121)
BUN/Creatinine Ratio: 16 (ref 12–28)
BUN: 13 mg/dL (ref 8–27)
Bilirubin Total: 0.4 mg/dL (ref 0.0–1.2)
CO2: 24 mmol/L (ref 20–29)
Calcium: 11.1 mg/dL — ABNORMAL HIGH (ref 8.7–10.3)
Chloride: 101 mmol/L (ref 96–106)
Creatinine, Ser: 0.81 mg/dL (ref 0.57–1.00)
Globulin, Total: 2.6 g/dL (ref 1.5–4.5)
Glucose: 134 mg/dL — ABNORMAL HIGH (ref 70–99)
Potassium: 4.3 mmol/L (ref 3.5–5.2)
Sodium: 140 mmol/L (ref 134–144)
Total Protein: 6.9 g/dL (ref 6.0–8.5)
eGFR: 80 mL/min/{1.73_m2} (ref >59)

## 2023-04-29 LAB — LIPID PANEL
Chol/HDL Ratio: 2.1 ratio (ref 0.0–4.4)
Cholesterol, Total: 134 mg/dL (ref 100–199)
HDL: 63 mg/dL (ref >39)
LDL Chol Calc (NIH): 52 mg/dL (ref 0–99)
Triglycerides: 106 mg/dL (ref 0–149)
VLDL Cholesterol Cal: 19 mg/dL (ref 5–40)

## 2023-04-29 LAB — HEMOGLOBIN A1C
Est. average glucose Bld gHb Est-mCnc: 169 mg/dL
Hgb A1c MFr Bld: 7.5 % — ABNORMAL HIGH (ref 4.8–5.6)

## 2023-04-29 MED ORDER — NALOXEGOL OXALATE 12.5 MG PO TABS: 12.5000 mg | ORAL_TABLET | Freq: Every day | ORAL | 11 refills | Status: DC

## 2023-04-29 MED ORDER — SEMAGLUTIDE (2 MG/DOSE) 8 MG/3ML ~~LOC~~ SOPN: 2.0000 mg | PEN_INJECTOR | SUBCUTANEOUS | 3 refills | Status: DC

## 2023-04-29 NOTE — Telephone Encounter (Signed)
Covermymeds is prior authorization Key: [id number] Name: Jodi Becker Ozempic 2mg /Dose 8mg /46ml injectors

## 2023-04-29 NOTE — Telephone Encounter (Signed)
Covermymeds is requesting prior authorization Key: [id number] Name: [place] Movantik 12.5 mg tablets has been rejected and requires PA

## 2023-04-29 NOTE — Progress Notes (Signed)
Slight increase in A1c; remains stable under 8%. Continue to recommend balanced, lower carb meals. Smaller meal size, adding snacks. Choosing water as drink of choice and increasing purposeful exercise. All other labs stable. Expect follow up calls and appts for mammogram and colonoscopy

## 2023-04-30 ENCOUNTER — Ambulatory Visit: Payer: Self-pay | Admitting: *Deleted

## 2023-04-30 NOTE — Telephone Encounter (Signed)
Reason for Disposition  [1] Follow-up call to recent contact AND [2] information only call, no triage required  Answer Assessment - Initial Assessment Questions 1. REASON FOR CALL or QUESTION: "What is your reason for calling today?" or "How can I best help you?" or "What question do you have that I can help answer?"     Pt called in and was given her lab result message from Merita Norton, FNP dated 04/29/2023 at 9:19 AM.  She wanted to let Robynn Pane know she just had a colonoscopy done last year and she didn't need to have another one for 3 years.  I let pt know if she had not heard from the mammogram scheduler after 2 weeks to call us back and let us know and we would follow up with them.  She verbalized understanding.  Protocols used: Information Only Call - No Triage-A-AH

## 2023-04-30 NOTE — Telephone Encounter (Signed)
  Chief Complaint: Pt called in and was given her lab results. Symptoms: N/A Frequency: N/A Pertinent Negatives: Patient denies N/A Disposition: [] ED /[] Urgent Care (no appt availability in office) / [] Appointment(In office/virtual)/ []  [place] [facility]/ [] Home Care/ [] Refused Recommended Disposition /[] [place] [facility]/ []  Follow-up with PCP Additional Notes: Lab results given  She had a colonoscopy last year and didn't need another one for 3 years.  Message sent to Merita Norton, FNP regarding this.

## 2023-05-04 ENCOUNTER — Telehealth: Payer: Self-pay | Admitting: Family Medicine

## 2023-05-04 NOTE — Telephone Encounter (Signed)
Covermymeds is requesting prior authorization Key: [id number] Name: [place] Ozempic 2 mg/dose

## 2023-05-08 ENCOUNTER — Encounter: Payer: Self-pay | Admitting: *Deleted

## 2023-05-20 NOTE — Telephone Encounter (Signed)
Phone rang 2 times and went quiet. Unable to leave voicemail. CRM created. Ok for [facility] to advise. Patient should contact pharmacy to see how soon rx can be picked up

## 2023-06-22 DIAGNOSIS — E119 Type 2 diabetes mellitus without complications: Secondary | ICD-10-CM | POA: Diagnosis not present

## 2023-06-22 DIAGNOSIS — L600 Ingrowing nail: Secondary | ICD-10-CM | POA: Diagnosis not present

## 2023-06-22 DIAGNOSIS — L851 Acquired keratosis [keratoderma] palmaris et plantaris: Secondary | ICD-10-CM | POA: Diagnosis not present

## 2023-06-22 DIAGNOSIS — M79674 Pain in right toe(s): Secondary | ICD-10-CM | POA: Diagnosis not present

## 2023-06-22 DIAGNOSIS — B351 Tinea unguium: Secondary | ICD-10-CM | POA: Diagnosis not present

## 2023-06-22 DIAGNOSIS — M79675 Pain in left toe(s): Secondary | ICD-10-CM | POA: Diagnosis not present

## 2023-07-02 ENCOUNTER — Ambulatory Visit
Admission: RE | Admit: 2023-07-02 | Discharge: 2023-07-02 | Disposition: A | Discharge status: Home / Self Care | Payer: PPO | Source: Ambulatory Visit | Attending: Family Medicine | Admitting: Family Medicine

## 2023-07-02 DIAGNOSIS — Z0000 Encounter for general adult medical examination without abnormal findings: Secondary | ICD-10-CM

## 2023-07-02 DIAGNOSIS — Z1231 Encounter for screening mammogram for malignant neoplasm of breast: Secondary | ICD-10-CM | POA: Diagnosis not present

## 2023-07-06 ENCOUNTER — Other Ambulatory Visit: Payer: Self-pay | Admitting: Family Medicine

## 2023-07-06 DIAGNOSIS — I10 Essential (primary) hypertension: Secondary | ICD-10-CM

## 2023-07-12 ENCOUNTER — Other Ambulatory Visit: Payer: Self-pay | Admitting: Family Medicine

## 2023-07-12 DIAGNOSIS — I10 Essential (primary) hypertension: Secondary | ICD-10-CM

## 2023-07-12 DIAGNOSIS — I152 Hypertension secondary to endocrine disorders: Secondary | ICD-10-CM

## 2023-07-17 ENCOUNTER — Telehealth: Payer: Self-pay | Admitting: Family Medicine

## 2023-07-17 ENCOUNTER — Telehealth: Payer: Self-pay | Admitting: Pharmacist

## 2023-07-17 NOTE — Progress Notes (Signed)
   07/17/2023  Patient ID: Jodi Becker, female   DOB: 1956-02-18, 67 y.o.   MRN: [id number]  Spoke with the patient on the phone- said she received notice that re-enrollment for Novo Nordisk PAP 2025 starts on October 4th this year. Was calling to get the process started.   Reviewed qualifications and attachments necessary for PAP approval. Patient will either attach bank statements showing social security statement from each month deposited or the first 2-3 pages of her 2023 tax return.  Aware to expect application for Ozempic PAP renewal in the mail in 1-2 weeks. Confirmed she received increased dose of Ozempic 2mg  from [employer] already.     Marlowe Aschoff, PharmD [facility] Phone Number: [phone]

## 2023-07-17 NOTE — Telephone Encounter (Signed)
Pt is calling to speak with Trinna Post regarding Semaglutide, 2 MG/DOSE, 8 MG/3ML SOPN [[id number]] renewing medication assistance program. Please advise CB- [phone]

## 2023-07-20 ENCOUNTER — Telehealth: Payer: Self-pay | Admitting: Family Medicine

## 2023-07-20 ENCOUNTER — Ambulatory Visit: Payer: Self-pay | Admitting: *Deleted

## 2023-07-20 ENCOUNTER — Other Ambulatory Visit: Payer: Self-pay | Admitting: Family Medicine

## 2023-07-20 DIAGNOSIS — I152 Hypertension secondary to endocrine disorders: Secondary | ICD-10-CM

## 2023-07-20 NOTE — Telephone Encounter (Signed)
Summary: rash betweeen legs   Medication: triamcinolone ointment (KENALOG) 0.5 % Elise prescribed 11/01/2021 for a rash.  Pt would like a refill of this medication. She had a rash between her legs and the rash between her legs has come back.  She said due to heat.      Called patient [phone] to review rash between legs and medication request. No answer, LVMTCB # [id number].

## 2023-07-20 NOTE — Telephone Encounter (Signed)
  Chief Complaint: Rash Symptoms: Rash between legs "Where it gets hot and moist, when you're overweight." States "Not red." Itchy. Frequency: Off and on for 4 months. Pertinent Negatives: Patient denies pain Disposition: [] ED /[] Urgent Care (no appt availability in office) / [] Appointment(In office/virtual)/ []  [place] [facility]/ [] Home Care/ [] Refused Recommended Disposition /[] [place] [facility]/ [x]  Follow-up with PCP Additional Notes: Pt evasive historian. Difficulty describing rash and location. States told PCP about it at last visit, "I was told to try GOld Bond. Didn't work." States no worse, "Just no better." Requesting Triamcinolone cream. "What I had the first time, that worked."  Please advise. CB# [phone] Reason for Disposition  Red, moist, irritated area between skin folds (or under larger breasts)  Answer Assessment - Initial Assessment Questions 1. APPEARANCE of RASH: "Describe the rash."      "Not red" 2. LOCATION: "Where is the rash located?"      Between legs 3. NUMBER: "How many spots are there?"      Diffuse 4. SIZE: "How big are the spots?" (Inches, centimeters or compare to size of a coin)      Diffuse. 5. ONSET: "When did the rash start?"      Quite a while, 4 months off and o 6. ITCHING: "Does the rash itch?" If Yes, ask: "How bad is the itch?"  (Scale 0-10; or none, mild, moderate, severe)     Yes. Itchy 7. PAIN: "Does the rash hurt?" If Yes, ask: "How bad is the pain?"  (Scale 0-10; or none, mild, moderate, severe)    - NONE (0): no pain    - MILD (1-3): doesn't interfere with normal activities     - MODERATE (4-7): interferes with normal activities or awakens from sleep     - SEVERE (8-10): excruciating pain, unable to do any normal activities     No 8. OTHER SYMPTOMS: "Do you have any other symptoms?" (e.g., fever)     no  Protocols used: Rash or Redness - Localized-A-AH

## 2023-07-20 NOTE — Telephone Encounter (Signed)
error 

## 2023-07-21 ENCOUNTER — Other Ambulatory Visit: Payer: Self-pay | Admitting: Family Medicine

## 2023-07-21 DIAGNOSIS — I152 Hypertension secondary to endocrine disorders: Secondary | ICD-10-CM

## 2023-07-21 MED ORDER — NYSTATIN-TRIAMCINOLONE 100000-0.1 UNIT/GM-% EX OINT: 1.0000 | TOPICAL_OINTMENT | Freq: Two times a day (BID) | CUTANEOUS | 0 refills | Status: DC

## 2023-07-21 NOTE — Telephone Encounter (Signed)
Patient advised as directed below. 

## 2023-07-21 NOTE — Telephone Encounter (Signed)
Request is too soon, last refill 07/13/23 for 90 days.  Requested Prescriptions  Pending Prescriptions Disp Refills   hydrochlorothiazide (HYDRODIURIL) 50 MG tablet [Pharmacy Med Name: hydroCHLOROthiazide 50 MG Oral Tablet] 90 tablet 0    Sig: Take 1 tablet by mouth once daily     Cardiovascular: Diuretics - Thiazide Passed - 07/20/2023 10:56 AM      Passed - Cr in normal range and within 180 days    Creatinine, Ser  Date Value Ref Range Status  04/28/2023 0.81 0.57 - 1.00 mg/dL Final         Passed - K in normal range and within 180 days    Potassium  Date Value Ref Range Status  04/28/2023 4.3 3.5 - 5.2 mmol/L Final         Passed - Na in normal range and within 180 days    Sodium  Date Value Ref Range Status  04/28/2023 140 134 - 144 mmol/L Final         Passed - Last BP in normal range    BP Readings from Last 1 Encounters:  04/28/23 95/66         Passed - Valid encounter within last 6 months    Recent Outpatient Visits           9 months ago Type 2 diabetes mellitus with other specified complication, without long-term current use of insulin (HCC)   [facility] Merita Norton T, FNP   10 months ago Pain in both lower extremities   [place] [facility] [place], [place], PA-C   12 months ago 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 [facility])   [facility] Jacky Kindle, FNP   1 year ago Annual physical exam   [facility] Merita Norton T, FNP   1 year ago Controlled type 2 diabetes mellitus with hyperglycemia, without long-term current use of insulin [facility])    [facility] Jacky Kindle, FNP       Future Appointments             In 3 months Jacky Kindle, FNP [facility], [facility]

## 2023-07-22 NOTE — Telephone Encounter (Signed)
Requested Prescriptions  Pending Prescriptions Disp Refills   hydrochlorothiazide (HYDRODIURIL) 50 MG tablet [Pharmacy Med Name: hydroCHLOROthiazide 50 MG Oral Tablet] 90 tablet 0    Sig: Take 1 tablet by mouth once daily     Cardiovascular: Diuretics - Thiazide Passed - 07/21/2023  9:25 PM      Passed - Cr in normal range and within 180 days    Creatinine, Ser  Date Value Ref Range Status  04/28/2023 0.81 0.57 - 1.00 mg/dL Final         Passed - K in normal range and within 180 days    Potassium  Date Value Ref Range Status  04/28/2023 4.3 3.5 - 5.2 mmol/L Final         Passed - Na in normal range and within 180 days    Sodium  Date Value Ref Range Status  04/28/2023 140 134 - 144 mmol/L Final         Passed - Last BP in normal range    BP Readings from Last 1 Encounters:  04/28/23 95/66         Passed - Valid encounter within last 6 months    Recent Outpatient Visits           9 months ago Type 2 diabetes mellitus with other specified complication, without long-term current use of insulin (HCC)   [place] [facility] Merita Norton T, FNP   10 months ago Pain in both lower extremities   [place] [facility] [place], [place], PA-C   12 months ago 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 [facility])   [place] [facility] Jacky Kindle, FNP   1 year ago Annual physical exam   [facility] Merita Norton T, FNP   1 year ago Controlled type 2 diabetes mellitus with hyperglycemia, without long-term current use of insulin [facility])   [place] [facility] Jacky Kindle, FNP       Future Appointments             In 3 months Jacky Kindle, FNP [facility], [facility]

## 2023-07-27 ENCOUNTER — Other Ambulatory Visit: Payer: Self-pay | Admitting: Family Medicine

## 2023-07-27 DIAGNOSIS — E1159 Type 2 diabetes mellitus with other circulatory complications: Secondary | ICD-10-CM

## 2023-07-28 NOTE — Telephone Encounter (Signed)
Reordered 07/13/23 #90 and was sent to requesting pharmacy Requested Prescriptions  Refused Prescriptions Disp Refills   hydrochlorothiazide (HYDRODIURIL) 50 MG tablet [Pharmacy Med Name: hydroCHLOROthiazide 50 MG Oral Tablet] 90 tablet 0    Sig: Take 1 tablet by mouth once daily     Cardiovascular: Diuretics - Thiazide Passed - 07/27/2023 11:29 AM      Passed - Cr in normal range and within 180 days    Creatinine, Ser  Date Value Ref Range Status  04/28/2023 0.81 0.57 - 1.00 mg/dL Final         Passed - K in normal range and within 180 days    Potassium  Date Value Ref Range Status  04/28/2023 4.3 3.5 - 5.2 mmol/L Final         Passed - Na in normal range and within 180 days    Sodium  Date Value Ref Range Status  04/28/2023 140 134 - 144 mmol/L Final         Passed - Last BP in normal range    BP Readings from Last 1 Encounters:  04/28/23 95/66         Passed - Valid encounter within last 6 months    Recent Outpatient Visits           9 months ago Type 2 diabetes mellitus with other specified complication, without long-term current use of insulin (HCC)   [place] [facility] Merita Norton T, FNP   10 months ago Pain in both lower extremities   [place] [facility] [place], [place], PA-C   1 year ago 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 [facility])   [facility] Jacky Kindle, FNP   1 year ago Annual physical exam   [facility] Merita Norton T, FNP   1 year ago Controlled type 2 diabetes mellitus with hyperglycemia, without long-term current use of insulin [facility])   [facility] Jacky Kindle, FNP       Future Appointments             In 3 months Jacky Kindle, FNP [facility], [facility]

## 2023-08-11 ENCOUNTER — Other Ambulatory Visit: Payer: Self-pay | Admitting: Family Medicine

## 2023-08-11 ENCOUNTER — Telehealth: Payer: Self-pay

## 2023-08-11 DIAGNOSIS — E1165 Type 2 diabetes mellitus with hyperglycemia: Secondary | ICD-10-CM

## 2023-08-11 NOTE — Telephone Encounter (Signed)
Copied from CRM [id number]. Topic: General - Other >> Aug 11, 2023 12:20 PM Turkey B wrote: Reason for CRM: pt called in about Ozempic, that she usually gets at a discount or no charge and she picks it up a the office. Pt is saying that at [facility] and they are charging her 209.00

## 2023-08-11 NOTE — Telephone Encounter (Signed)
Patient advised.

## 2023-08-18 ENCOUNTER — Other Ambulatory Visit: Payer: Self-pay | Admitting: Family Medicine

## 2023-08-18 ENCOUNTER — Telehealth: Payer: Self-pay

## 2023-08-18 DIAGNOSIS — Z5986 Financial insecurity: Secondary | ICD-10-CM

## 2023-08-18 NOTE — Telephone Encounter (Unsigned)
Copied from CRM [id number]. Topic: General - Inquiry >> Aug 18, 2023  2:38 PM Runell Gess P wrote: Reason for CRM: pt called saying she used to get her Ozempic with medication assistance program but now since Trinna Post has left it will cost her 200.00 a quarter.  She said she can not afford that  CB@  [phone]

## 2023-08-19 ENCOUNTER — Other Ambulatory Visit: Payer: Self-pay | Admitting: Family Medicine

## 2023-08-19 ENCOUNTER — Telehealth: Payer: Self-pay

## 2023-08-19 DIAGNOSIS — E1169 Type 2 diabetes mellitus with other specified complication: Secondary | ICD-10-CM

## 2023-08-19 NOTE — Progress Notes (Signed)
   Care Guide Note  08/19/2023 Name: [name] MRN: [id number] DOB: [birth year]  Referred by: Jacky Kindle, FNP Reason for referral : Care Coordination (Outreach to schedule with Pharm d )   Jodi Becker is a 66 y.o. year old female who is a primary care patient of Jacky Kindle, FNP. Jodi Becker was referred to the pharmacist for assistance related to DM.    An unsuccessful telephone outreach was attempted today to contact the patient who was referred to the pharmacy team for assistance with medication assistance. Additional attempts will be made to contact the patient.   [name], RMA Care Guide [facility]  [place], [place] [address] Direct Dial: [phone] [email or web address]

## 2023-08-19 NOTE — Telephone Encounter (Signed)
Pt reports she was contacted this morning and advised

## 2023-08-24 ENCOUNTER — Telehealth: Payer: Self-pay | Admitting: Pharmacist

## 2023-08-24 NOTE — Progress Notes (Signed)
   Care Guide Note  08/24/2023 Name: Jodi Becker MRN: [id number] DOB: 10/08/1956  Referred by: Jacky Kindle, FNP Reason for referral : Care Coordination (Outreach to schedule with Pharm d )   Jodi Becker is a 67 y.o. year old female who is a primary care patient of Jacky Kindle, FNP. Jodi Becker was referred to the pharmacist for assistance related to DM.    Successful contact was made with the patient to discuss pharmacy services including being ready for the pharmacist to call at least 5 minutes before the scheduled appointment time, to have medication bottles and any blood sugar or blood pressure readings ready for review. The patient agreed to meet with the pharmacist via with the pharmacist via telephone visit on (date/time).  08/26/2023  [name], RMA Care Guide [facility]  [place], [place] [address] Direct Dial: [phone] [name]

## 2023-08-24 NOTE — Progress Notes (Signed)
   08/24/2023  Patient ID: Jodi Becker, female   DOB: [birth year], 67 y.o.   MRN: [id number]  Called and spoke with [facility] to verify that Ozempic 2mg  refill was in-process. They report the processing started on 08/19/23 and that the shipment can arrive in 10-14 business days, so hopefully around 09/02/23. Office will notify patient when it arrives.   Marlowe Aschoff, PharmD [facility] Phone Number: [phone]

## 2023-08-26 ENCOUNTER — Other Ambulatory Visit: Payer: PPO | Admitting: Pharmacist

## 2023-08-26 ENCOUNTER — Telehealth: Payer: Self-pay | Admitting: Family Medicine

## 2023-08-26 NOTE — Progress Notes (Signed)
08/26/2023 Name: Jodi Becker MRN: [id number] DOB: 1956/06/21  Chief Complaint  Patient presents with   Medication Management    Jodi Becker is a 67 y.o. year old female who presented for a telephone visit.   They were referred to the pharmacist by their PCP for assistance in managing medication access.    Subjective:  Care Team: Primary Care Provider: Jacky Kindle, FNP ; Next Scheduled Visit: 10/29/23 Clinical Pharmacist: Marlowe Aschoff, PharmD  Medication Access/Adherence  Current Pharmacy:  [facility] [address] ([address]), [place] - [address] [place] ([place]) [place] [address] Phone: [phone] Fax: [phone]  [facility] [place], [place] - [address] [place] [address] Phone: [phone] Fax: [phone]   Patient reports affordability concerns with their medications: No  Patient reports access/transportation concerns to their pharmacy: No  Patient reports adherence concerns with their medications:  No     Diabetes:  Current medications: Glipizide 10mg  daily, Metformin 500mg  (max dose of 2000mg  daily), Ozempic 1mg  (2mg  shipment coming soon) Medications tried in the past: Unknown  Current glucose readings:  174 in AM (soup + PB sandwich for dinner) 133 before bedtime 186 in AM (Ramen noodles for dinner) 9PM sweets- debbie cakes or honey buns Diet soda, tea with Equal sweetner  Patient denies hypoglycemic s/sx including dizziness, shakiness, sweating. Patient denies hyperglycemic symptoms including polyuria, polydipsia, polyphagia, nocturia, neuropathy, blurred vision.  Current meal patterns:  - Concerns about dinner and sweets at nighttime potentially causing high readings in the morning  Current medication access support: H[employer] Medicare Advantage   Objective:  Lab Results  Component Value Date   HGBA1C 7.5 (H) 04/28/2023    Lab Results   Component Value Date   CREATININE 0.81 04/28/2023   BUN 13 04/28/2023   NA 140 04/28/2023   K 4.3 04/28/2023   CL 101 04/28/2023   CO2 24 04/28/2023    Lab Results  Component Value Date   CHOL 134 04/28/2023   HDL 63 04/28/2023   LDLCALC 52 04/28/2023   TRIG 106 04/28/2023   CHOLHDL 2.1 04/28/2023    Medications Reviewed Today     Reviewed by Pollie Friar, RPH (Pharmacist) on 08/26/23 at 0910  Med List Status: <None>   Medication Order Taking? Sig Documenting Provider Last Dose Status Informant  amLODipine (NORVASC) 10 MG tablet [id number] Yes Take 1 tablet by mouth once daily Jacky Kindle, FNP Taking Active   aspirin 81 MG chewable tablet [id number] Yes Chew by mouth. [provider] Taking Active            Med Note Lauretta Chester, ALEXANDRE A   Mon Jan 27, 2022  1:28 PM)    b complex vitamins capsule [id number] Yes Take 1 capsule by mouth daily. [provider] Taking Active   Biotin 5000 MCG TABS [id number] Yes Take 2 tablets by mouth. [provider] Taking Active   blood glucose meter kit and supplies [id number] Yes 1 each by Other route daily. Dispense based on patient and insurance preference. Use up to four times daily as directed. FOR ICD-10 E10.9, E11.9. Jacky Kindle, FNP Taking Active   Blood Glucose Monitoring Suppl DEVI [id number] Yes 1 each by Does not apply route in the morning, at noon, and at bedtime. May substitute to any manufacturer covered by patient's insurance. Jacky Kindle, FNP Taking Active   Cholecalciferol (VITAMIN D3) 1000 UNITS  CAPS [id number] Yes Take 2,000 Units by mouth daily. [provider] Taking Active            Med Note Lauretta Chester, ALEXANDRE A   Mon Jan 27, 2022  1:28 PM)    gabapentin (NEURONTIN) 100 MG capsule [id number] Yes Take 1 capsule (100 mg total) by mouth at bedtime. Jacky Kindle, FNP Taking Active   glipiZIDE (GLUCOTROL XL) 10 MG 24 hr tablet [id number] Yes Take 1 tablet by mouth once daily with  breakfast Jacky Kindle, FNP Taking Active   hydrochlorothiazide (HYDRODIURIL) 50 MG tablet [id number] Yes Take 1 tablet by mouth once daily Jacky Kindle, FNP Taking Active   Lancets [facility] Larose Kells PLUS [place]) MISC [id number] Yes USE TO CHECK BLOOD SUGAR UP TO 4 TIMES DAILY Jacky Kindle, FNP Taking Active   loratadine (CLARITIN) 10 MG tablet [id number] Yes Take 1 tablet by mouth once daily Jacky Kindle, FNP Taking Active   losartan (COZAAR) 100 MG tablet [id number] Yes Take 1 tablet by mouth once daily Jacky Kindle, FNP Taking Active   Magnesium 100 MG TABS [id number] Yes Take 100 mg by mouth daily. [provider] Taking Active   metFORMIN (GLUCOPHAGE) 500 MG tablet [id number] Yes Take 2 tablets (1,000 mg total) by mouth 2 (two) times daily. Jacky Kindle, FNP Taking Active   mineral oil-hydrophilic petrolatum (AQUAPHOR) ointment [id number] Yes Apply topically as needed for dry skin. Jacky Kindle, FNP Taking Active   naloxegol oxalate (MOVANTIK) 12.5 MG TABS tablet [id number] Yes Take 1 tablet (12.5 mg total) by mouth daily. Jacky Kindle, FNP Taking Active   nystatin-triamcinolone ointment [facility]) [id number] Yes Apply 1 Application topically 2 (two) times daily. Jacky Kindle, FNP Taking Active   [facility] ULTRA test strip [id number] Yes USE TO CHECK BLOOD SUGAR UP TO 4 TIMES DAILY Jacky Kindle, FNP Taking Active   pravastatin (PRAVACHOL) 40 MG tablet [id number] Yes Take 1 tablet (40 mg total) by mouth daily. Jacky Kindle, FNP Taking Active   Semaglutide, 2 MG/DOSE, 8 MG/3ML Namon Cirri [id number] Yes Inject 2 mg as directed once a week.  Patient taking differently: Inject 2 mg as directed once a week. PAP medication   Jacky Kindle, FNP Taking Active   vitamin C (ASCORBIC ACID) 500 MG tablet [id number] Yes Take 500 mg by mouth daily. [provider] Taking Active               Assessment/Plan:   Diabetes: - Currently controlled - Reviewed long term  cardiovascular and renal outcomes of uncontrolled blood sugar - Reviewed goal A1c, goal fasting, and goal 2 hour post prandial glucose - Reviewed dietary modifications including limiting carb intake (breads, pasta, rice) and sweets later at night (Little Debbies or honey buns)- provided alternatives like sugar-free cookies/candies and keto wraps/whole wheat bread - Recommend to check glucose 1-2 times a day     Follow Up Plan:  - No follow-up needed- Ozempic PAP concerns had already been previously addressed- should be arriving around 11/20 to the office - Provided diet and constipation education during our call time- as requested by patient   Marlowe Aschoff, PharmD [facility] Phone Number: [phone]

## 2023-09-02 ENCOUNTER — Other Ambulatory Visit: Payer: Self-pay | Admitting: Family Medicine

## 2023-09-02 DIAGNOSIS — E1165 Type 2 diabetes mellitus with hyperglycemia: Secondary | ICD-10-CM

## 2023-09-24 ENCOUNTER — Telehealth: Payer: Self-pay | Admitting: Family Medicine

## 2023-10-03 ENCOUNTER — Other Ambulatory Visit: Payer: Self-pay | Admitting: Family Medicine

## 2023-10-03 DIAGNOSIS — I10 Essential (primary) hypertension: Secondary | ICD-10-CM

## 2023-10-06 ENCOUNTER — Other Ambulatory Visit: Payer: Self-pay | Admitting: Family Medicine

## 2023-10-06 DIAGNOSIS — I10 Essential (primary) hypertension: Secondary | ICD-10-CM

## 2023-10-06 NOTE — Telephone Encounter (Signed)
Requested Prescriptions  Pending Prescriptions Disp Refills   amLODipine (NORVASC) 10 MG tablet [Pharmacy Med Name: amLODIPine Besylate 10 MG Oral Tablet] 90 tablet 0    Sig: Take 1 tablet by mouth once daily     Cardiovascular: Calcium Channel Blockers 2 Passed - 10/06/2023  3:52 PM      Passed - Last BP in normal range    BP Readings from Last 1 Encounters:  04/28/23 95/66         Passed - Last Heart Rate in normal range    Pulse Readings from Last 1 Encounters:  04/28/23 70         Passed - Valid encounter within last 6 months    Recent Outpatient Visits           11 months ago Type 2 diabetes mellitus with other specified complication, without long-term current use of insulin (HCC)   [place] [facility] Merita Norton T, FNP   1 year ago Pain in both lower extremities   [place] [facility] [place], [place], PA-C   1 year ago 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 [facility])   [place] [facility] Jacky Kindle, FNP   1 year ago Annual physical exam   [facility] Merita Norton T, FNP   1 year ago Controlled type 2 diabetes mellitus with hyperglycemia, without long-term current use of insulin [facility])   [facility] Jacky Kindle, [place]

## 2023-10-08 ENCOUNTER — Other Ambulatory Visit: Payer: Self-pay | Admitting: Family Medicine

## 2023-10-08 DIAGNOSIS — I152 Hypertension secondary to endocrine disorders: Secondary | ICD-10-CM

## 2023-10-16 NOTE — Telephone Encounter (Signed)
 Received notification from [employer] regarding approval for OZEMPIC. Patient assistance approved from 10/15/23 to 10/08/24.  Medication will ship to [facility]  Pt ID: [id number]  Company phone: [phone]

## 2023-10-28 ENCOUNTER — Telehealth: Payer: Self-pay | Admitting: Family Medicine

## 2023-10-28 NOTE — Telephone Encounter (Signed)
 Pt is calling in because she says Ozempic  is making her sick Pt says the medicine has always caused stomach issues but with her dosage being updated it has made things worse. Please follow up with pt.

## 2023-10-29 ENCOUNTER — Ambulatory Visit: Payer: HMO | Admitting: Family Medicine

## 2023-11-02 ENCOUNTER — Ambulatory Visit: Payer: HMO | Admitting: Family Medicine

## 2023-11-03 ENCOUNTER — Telehealth: Payer: Self-pay | Admitting: Family Medicine

## 2023-11-03 DIAGNOSIS — E1159 Type 2 diabetes mellitus with other circulatory complications: Secondary | ICD-10-CM

## 2023-11-03 MED ORDER — HYDROCHLOROTHIAZIDE 50 MG PO TABS: 50.0000 mg | ORAL_TABLET | Freq: Every day | ORAL | 0 refills | Status: DC

## 2023-11-03 NOTE — Telephone Encounter (Signed)
[place] faxed refill request for the following medications:  hydrochlorothiazide (HYDRODIURIL) 50 MG tablet   Please advise.

## 2023-11-05 ENCOUNTER — Ambulatory Visit: Payer: HMO | Admitting: Physician Assistant

## 2023-11-05 ENCOUNTER — Ambulatory Visit (INDEPENDENT_AMBULATORY_CARE_PROVIDER_SITE_OTHER): Payer: HMO | Admitting: Physician Assistant

## 2023-11-05 ENCOUNTER — Encounter: Payer: Self-pay | Admitting: Physician Assistant

## 2023-11-05 VITALS — BP 122/76 | HR 82 | Resp 16 | Ht 65.0 in | Wt 266.0 lb

## 2023-11-05 DIAGNOSIS — L84 Corns and callosities: Secondary | ICD-10-CM | POA: Diagnosis not present

## 2023-11-05 DIAGNOSIS — Z7984 Long term (current) use of oral hypoglycemic drugs: Secondary | ICD-10-CM

## 2023-11-05 DIAGNOSIS — Z7189 Other specified counseling: Secondary | ICD-10-CM | POA: Diagnosis not present

## 2023-11-05 DIAGNOSIS — M79675 Pain in left toe(s): Secondary | ICD-10-CM | POA: Diagnosis not present

## 2023-11-05 DIAGNOSIS — E785 Hyperlipidemia, unspecified: Secondary | ICD-10-CM | POA: Diagnosis not present

## 2023-11-05 DIAGNOSIS — Z1329 Encounter for screening for other suspected endocrine disorder: Secondary | ICD-10-CM | POA: Diagnosis not present

## 2023-11-05 DIAGNOSIS — E1169 Type 2 diabetes mellitus with other specified complication: Secondary | ICD-10-CM

## 2023-11-05 DIAGNOSIS — I152 Hypertension secondary to endocrine disorders: Secondary | ICD-10-CM

## 2023-11-05 DIAGNOSIS — M79674 Pain in right toe(s): Secondary | ICD-10-CM | POA: Diagnosis not present

## 2023-11-05 DIAGNOSIS — E1159 Type 2 diabetes mellitus with other circulatory complications: Secondary | ICD-10-CM | POA: Diagnosis not present

## 2023-11-05 DIAGNOSIS — Z0000 Encounter for general adult medical examination without abnormal findings: Secondary | ICD-10-CM | POA: Diagnosis not present

## 2023-11-05 DIAGNOSIS — E11628 Type 2 diabetes mellitus with other skin complications: Secondary | ICD-10-CM

## 2023-11-05 DIAGNOSIS — B351 Tinea unguium: Secondary | ICD-10-CM | POA: Diagnosis not present

## 2023-11-05 MED ORDER — RYBELSUS 3 MG PO TABS
3.0000 mg | ORAL_TABLET | Freq: Every day | ORAL | 0 refills | Status: DC
Start: 2023-11-05 — End: 2024-04-18

## 2023-11-05 NOTE — Assessment & Plan Note (Signed)
Chronic, historic condition  Currently taking Pravastatin 40 mg PO every day and appears to be tolerating well  Continue current regimen Recheck lipid panel today Results to dictate further management  Follow up in 6 months or sooner if concerns arise
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
## 2023-11-05 NOTE — Assessment & Plan Note (Signed)
Chronic, historic condition Currently taking Metformin 1000 mg PO BID, Glipizide 10 mg PO every day,  Most recent A1c was 7.5%. Recheck today. Results to dictate further management  She has stopped taking Ozempic due to side effects and is amenable to trying Rybelsus today Will start on 3 mg PO every day and have her follow up with PCP in 3 months for continued monitoring Reviewed dietary measures for DM and GLP-1 medications. Recommend increasing fiber and protein intake, reducing sugar and carbs  Follow up in 3 months or sooner if concerns arise

## 2023-11-05 NOTE — Patient Instructions (Signed)
I have sent in a script for a diabetes medication called Rybelsus. Please take this once per day, 30 minutes prior to your first meal of the day.   Continue with your other medications as directed  To help with your constipation I recommend increasing your fiber  I recommend increasing your daily fiber intake- do this gradually to prevent bloating and abdominal discomfort. Try to get about 5 grams per day the first week or so then increase by 5 grams each week until you feel like you are having comfortable bowel movements  It was nice to meet you and I appreciate the opportunity to be involved in your care If you were satisfied with the care you received from me, I would greatly appreciate you saying so in the after-visit survey that is sent out following our visit.

## 2023-11-05 NOTE — Assessment & Plan Note (Signed)
A voluntary discussion about advance care planning including the explanation and discussion of advance directives was extensively discussed  with the patient for 3 minutes with patient and myself present.  Explanation about the health care proxy and Living will was reviewed and packet with forms with explanation of how to fill them out was given.  During this discussion, the patient was able to identify a health care proxy as no one currently and plans to fill out the paperwork required.  Patient was offered a separate Advance Care Planning visit for further assistance with forms.

## 2023-11-05 NOTE — Assessment & Plan Note (Signed)
Chronic, historic condition Appears to be doing well on current regimen comprised of Amlodipine 10 mg PO every day, Losartan 100 mg PO every day, hydrochlorothiazide 50 mg PO every day,  Continue current regimen Continue monitoring BP at home  Follow up in 3 months or sooner if concerns arise
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
## 2023-11-06 ENCOUNTER — Encounter: Payer: Self-pay | Admitting: Physician Assistant

## 2023-11-06 LAB — CBC WITH DIFFERENTIAL/PLATELET
Absolute Lymphocytes: 1801 {cells}/uL (ref 850–3900)
Absolute Monocytes: 473 {cells}/uL (ref 200–950)
Basophils Absolute: 51 {cells}/uL (ref 0–200)
Basophils Relative: 0.9 %
Eosinophils Absolute: 279 {cells}/uL (ref 15–500)
Eosinophils Relative: 4.9 %
HCT: 42.6 % (ref 35.0–45.0)
Hemoglobin: 13.5 g/dL (ref 11.7–15.5)
MCH: 27 pg (ref 27.0–33.0)
MCHC: 31.7 g/dL — ABNORMAL LOW (ref 32.0–36.0)
MCV: 85.2 fL (ref 80.0–100.0)
MPV: 12.2 fL (ref 7.5–12.5)
Monocytes Relative: 8.3 %
Neutro Abs: 3095 {cells}/uL (ref 1500–7800)
Neutrophils Relative %: 54.3 %
Platelets: 201 10*3/uL (ref 140–400)
RBC: 5 10*6/uL (ref 3.80–5.10)
RDW: 13.3 % (ref 11.0–15.0)
Total Lymphocyte: 31.6 %
WBC: 5.7 10*3/uL (ref 3.8–10.8)

## 2023-11-06 LAB — HEMOGLOBIN A1C
Hgb A1c MFr Bld: 8.4 %{Hb} — ABNORMAL HIGH (ref <5.7)
Mean Plasma Glucose: 194 mg/dL
eAG (mmol/L): 10.8 mmol/L

## 2023-11-06 LAB — COMPLETE METABOLIC PANEL WITH GFR
AG Ratio: 1.5 (calc) (ref 1.0–2.5)
ALT: 48 U/L — ABNORMAL HIGH (ref 6–29)
AST: 23 U/L (ref 10–35)
Albumin: 4.5 g/dL (ref 3.6–5.1)
Alkaline phosphatase (APISO): 83 U/L (ref 37–153)
BUN: 16 mg/dL (ref 7–25)
CO2: 28 mmol/L (ref 20–32)
Calcium: 11.7 mg/dL — ABNORMAL HIGH (ref 8.6–10.4)
Chloride: 102 mmol/L (ref 98–110)
Creat: 0.73 mg/dL (ref 0.50–1.05)
Globulin: 3 g/dL (ref 1.9–3.7)
Glucose, Bld: 205 mg/dL — ABNORMAL HIGH (ref 65–99)
Potassium: 5.1 mmol/L (ref 3.5–5.3)
Sodium: 139 mmol/L (ref 135–146)
Total Bilirubin: 0.6 mg/dL (ref 0.2–1.2)
Total Protein: 7.5 g/dL (ref 6.1–8.1)
eGFR: 90 mL/min/{1.73_m2} (ref >=60)

## 2023-11-06 LAB — MICROALBUMIN / CREATININE URINE RATIO
Creatinine, Urine: 51 mg/dL (ref 20–275)
Microalb Creat Ratio: 6 mg/g{creat} (ref <30)
Microalb, Ur: 0.3 mg/dL

## 2023-11-06 LAB — TSH: TSH: 1.45 m[IU]/L (ref 0.40–4.50)

## 2023-11-06 LAB — LIPID PANEL
Cholesterol: 145 mg/dL (ref <200)
HDL: 67 mg/dL (ref >=50)
LDL Cholesterol (Calc): 58 mg/dL
Non-HDL Cholesterol (Calc): 78 mg/dL (ref <130)
Total CHOL/HDL Ratio: 2.2 (calc) (ref <5.0)
Triglycerides: 123 mg/dL (ref <150)

## 2023-11-06 NOTE — Progress Notes (Signed)
A1c is above goal at 8.4% - we added Rybelsus 3 mg to regimen as she does not want to use Ozempic anymore. Recheck in 3 months recommended Calcium is elevated- appears stable but may need to check PTH at follow up  Lipid, TSH, CBC, microalbumin were normal

## 2023-11-09 ENCOUNTER — Telehealth: Payer: Self-pay | Admitting: Physician Assistant

## 2023-11-09 DIAGNOSIS — E1169 Type 2 diabetes mellitus with other specified complication: Secondary | ICD-10-CM

## 2023-11-09 NOTE — Telephone Encounter (Addendum)
Pt is calling to report is calling to ask questions about patient assistance for her Semaglutide (RYBELSUS) 3 MG TABS [[id number]] . Reporting that her sugar is elevated 216 Cb- [phone]

## 2023-11-12 MED ORDER — GLIPIZIDE ER 10 MG PO TB24
20.0000 mg | ORAL_TABLET | Freq: Every day | ORAL | 0 refills | Status: DC
Start: 2023-11-12 — End: 2024-02-10

## 2023-11-12 NOTE — Telephone Encounter (Signed)
Called and left voicemail for patient regarding increasing Glipizide XL to 20mg  daily. Sent Rx as two 10mg  tablets in case patient does NOT tolerate it. 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

## 2023-11-23 ENCOUNTER — Telehealth: Payer: Self-pay | Admitting: Physician Assistant

## 2023-11-23 MED ORDER — ONETOUCH DELICA PLUS LANCET33G MISC: 0 refills | Status: AC

## 2023-11-23 NOTE — Telephone Encounter (Signed)
 [facility] faxed refill request for the following medications:   Lancets (ONETOUCH DELICA PLUS LANCET33G) MISC     Please advise.

## 2023-11-24 ENCOUNTER — Other Ambulatory Visit: Payer: Self-pay | Admitting: Physician Assistant

## 2023-11-24 MED ORDER — ONETOUCH ULTRA VI STRP: ORAL_STRIP | 2 refills | Status: DC

## 2023-11-24 NOTE — Telephone Encounter (Signed)
Copied from CRM [id number]. Topic: General - Other >> Nov 24, 2023  9:25 AM Everette C wrote: Reason for CRM: Medication Refill - Most Recent Primary Care Visit:  Provider: Providence Crosby Department: [facility] Visit Type: OFFICE VISIT Date: 11/05/2023  Medication: [place]n [place] test strip [[id number]]  Has the patient contacted their pharmacy? Yes (Agent: If no, request that the patient contact the pharmacy for the refill. If patient does not wish to contact the pharmacy document the reason why and proceed with request.) (Agent: If yes, when and what did the pharmacy advise?)  Is this the correct pharmacy for this prescription? Yes If no, delete pharmacy and type the correct one.  This is the patient's preferred pharmacy:  [facility] [address] ([address]), [place] - [address] [place]) [place] [address] Phone: [phone] Fax: [phone]   Has the prescription been filled recently? Yes  Is the patient out of the medication? Yes  Has the patient been seen for an appointment in the last year OR does the patient have an upcoming appointment? Yes  Can we respond through MyChart? No  Agent: Please be advised that Rx refills may take up to 3 business days. We ask that you follow-up with your pharmacy.

## 2023-11-24 NOTE — Telephone Encounter (Signed)
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)   [place] [facility] Mecum, Erin E, PA-C   1 year ago Type 2 diabetes mellitus with other specified complication, without long-term current use of insulin [facility])   [facility] Merita Norton T, FNP   1 year ago Pain in both lower extremities   [place] [facility] [place], [place], PA-C   1 year ago 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 [facility])   [facility] Jacky Kindle, FNP   1 year ago Annual physical exam   [facility] Jacky Kindle, FNP       Future Appointments             In 2 months Ostwalt, Edmon Crape, PA-C [facility]

## 2023-11-30 ENCOUNTER — Telehealth: Payer: Self-pay | Admitting: Physician Assistant

## 2023-11-30 NOTE — Telephone Encounter (Signed)
[facility] faxed refill request for the following medications:  glucose blood (ONETOUCH ULTRA) test strip    Please advise

## 2023-12-01 ENCOUNTER — Other Ambulatory Visit: Payer: Self-pay | Admitting: Physician Assistant

## 2023-12-01 ENCOUNTER — Other Ambulatory Visit: Payer: Self-pay

## 2023-12-01 ENCOUNTER — Telehealth: Payer: Self-pay

## 2023-12-01 ENCOUNTER — Telehealth: Payer: Self-pay | Admitting: Physician Assistant

## 2023-12-01 DIAGNOSIS — E1169 Type 2 diabetes mellitus with other specified complication: Secondary | ICD-10-CM

## 2023-12-01 MED ORDER — ONETOUCH ULTRA TEST VI STRP: ORAL_STRIP | 2 refills | Status: DC

## 2023-12-01 NOTE — Telephone Encounter (Signed)
Medication Refill -  Most Recent Primary Care Visit:  Provider: Jacquelin Hawking E  Department: [facility]  Visit Type: OFFICE VISIT  Date: 11/05/2023  Medication: glucose blood (ONETOUCH ULTRA) test strip   Has the patient contacted their pharmacy? Yes   Pharmacy has sent in a request for the test strips.    Is this the correct pharmacy for this prescription? Yes  This is the patient's preferred pharmacy:  [facility] [address]), [place] - [address] [place]) [place] [address] Phone: [phone] Fax: [phone]   Has the prescription been filled recently? Yes  Showing prescription was sent in on 11/24/2023, pt states that she never received the prescription.    Is the patient out of the medication? Yes Pt is upset, states that she cannot check her blood sugar with out her test strips.   Has the patient been seen for an appointment in the last year OR does the patient have an upcoming appointment? Yes  Can we respond through MyChart? No  Agent: Please be advised that Rx refills may take up to 3 business days. We ask that you follow-up with your pharmacy.

## 2023-12-01 NOTE — Telephone Encounter (Signed)
Copied from CRM [id number]. Topic: General - Other >> Dec 01, 2023  9:09 AM Franchot Heidelberg wrote: Reason for CRM: Pt called back reporting that the pharmacy needs details for medication directions in order to submit to her insurance. This is for her blood glucose test strips.

## 2023-12-01 NOTE — Telephone Encounter (Signed)
 Refilled today in a separate refill encounter.

## 2023-12-02 ENCOUNTER — Telehealth: Payer: Self-pay | Admitting: Physician Assistant

## 2023-12-02 DIAGNOSIS — E1165 Type 2 diabetes mellitus with hyperglycemia: Secondary | ICD-10-CM

## 2023-12-02 MED ORDER — METFORMIN HCL 500 MG PO TABS
1000.0000 mg | ORAL_TABLET | Freq: Two times a day (BID) | ORAL | 0 refills | Status: DC
Start: 2023-12-02 — End: 2024-02-05

## 2023-12-02 NOTE — Telephone Encounter (Signed)
[facility] is requesting refill metFORMIN (GLUCOPHAGE) 500 MG tablet  Please advise

## 2023-12-03 ENCOUNTER — Telehealth: Payer: Self-pay | Admitting: Physician Assistant

## 2023-12-03 ENCOUNTER — Other Ambulatory Visit: Payer: Self-pay | Admitting: Physician Assistant

## 2023-12-03 DIAGNOSIS — E1159 Type 2 diabetes mellitus with other circulatory complications: Secondary | ICD-10-CM

## 2023-12-03 NOTE — Telephone Encounter (Unsigned)
Copied from CRM [id number]. Topic: Clinical - Medication Refill >> Dec 03, 2023 11:43 AM Turkey B wrote: Most Recent Primary Care Visit:  Provider: Providence Crosby  Department: [facility]  Visit Type: OFFICE VISIT  Date: 11/05/2023  Medication: glucose blood (ONETOUCH ULTRA TEST) test strip/hydrochlorothiazide (HYDRODIURIL) 50 MG tablet   Has the patient contacted their pharmacy? Yes but dint mention needing these meds  Is this the correct pharmacy for this prescription? yes This is the patient's preferred pharmacy:  [facility] [address] ([address]), [place] - [address] [place]) [place] [address] Phone: [phone] Fax: [phone]   Has the prescription been filled recently? no  Is the patient out of the medication? yes  Has the patient been seen for an appointment in the last year OR does the patient have an upcoming appointment? yes  Can we respond through MyChart? yes  Agent: Please be advised that Rx refills may take up to 3 business days. We ask that you follow-up with your pharmacy.

## 2023-12-03 NOTE — Telephone Encounter (Signed)
Copied from CRM [id number]. Topic: Clinical - Medication Refill >> Dec 03, 2023 12:09 PM Turkey B wrote: Most Recent Primary Care Visit:  Provider: Providence Crosby  Department: [facility]  Visit Type: OFFICE VISIT  Date: 11/05/2023  Medication: glucose blood (ONETOUCH ULTRA TEST) test strip/hydrochlorothiazide (HYDRODIURIL) 50 MG tablet  Has the patient contacted their pharmacy? Yes, but didn't mention to pharm needing these ( Is this the correct pharmacy for this prescription? yes If no, delete pharmacy and type the correct one.  This is the patient's preferred pharmacy:  [facility] [address] ([address]), [place] - [address] [place]) [place] [address] Phone: [phone] Fax: [phone]   Has the prescription been filled recently? yes  Is the patient out of the medication? yes  Has the patient been seen for an appointment in the last year OR does the patient have an upcoming appointment? yes  Can we respond through MyChart? yes  Agent: Please be advised that Rx refills may take up to 3 business days. We ask that you follow-up with your pharmacy.

## 2023-12-03 NOTE — Telephone Encounter (Unsigned)
Copied from CRM [id number]. Topic: Clinical - Medication Refill >> Dec 03, 2023 11:51 AM Turkey B wrote: Most Recent Primary Care Visit:  Provider: Providence Crosby  Department: [facility]  Visit Type: OFFICE VISIT  Date: 11/05/2023  Medication: hydrochlorothiazide (HYDRODIURIL) 50 MG tablet /glucose blood (ONETOUCH ULTRA TEST) test strip  Has the patient contacted their pharmacy? Yes but dint mention these meds to get fro pharmacy   Is this the correct pharmacy for this prescription? yes This is the patient's preferred pharmacy:  [facility] [address] ([address]), [place] - [address] [place]) [place] [address] Phone: [phone] Fax: [phone]   Has the prescription been filled recently? no  Is the patient out of the medication? yes  Has the patient been seen for an appointment in the last year OR does the patient have an upcoming appointment? yes  Can we respond through MyChart? yes  Agent: Please be advised that Rx refills may take up to 3 business days. We ask that you follow-up with your pharmacy.

## 2023-12-03 NOTE — Telephone Encounter (Signed)
Medication refill request duplicate-see chart for existing encounter

## 2023-12-04 NOTE — Telephone Encounter (Signed)
Requested Prescriptions  Pending Prescriptions Disp Refills   hydrochlorothiazide (HYDRODIURIL) 50 MG tablet [Pharmacy Med Name: hydroCHLOROthiazide 50 MG Oral Tablet] 90 tablet 0    Sig: Take 1 tablet by mouth once daily     Cardiovascular: Diuretics - Thiazide Passed - 12/04/2023  9:44 AM      Passed - Cr in normal range and within 180 days    Creat  Date Value Ref Range Status  11/05/2023 0.73 0.50 - 1.05 mg/dL Final   Creatinine, Urine  Date Value Ref Range Status  11/05/2023 51 20 - 275 mg/dL Final         Passed - K in normal range and within 180 days    Potassium  Date Value Ref Range Status  11/05/2023 5.1 3.5 - 5.3 mmol/L Final         Passed - Na in normal range and within 180 days    Sodium  Date Value Ref Range Status  11/05/2023 139 135 - 146 mmol/L Final  04/28/2023 140 134 - 144 mmol/L Final         Passed - Last BP in normal range    BP Readings from Last 1 Encounters:  11/05/23 122/76         Passed - Valid encounter within last 6 months    Recent Outpatient Visits           4 weeks ago 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)   [place] Mecum, Erin E, PA-C   1 year ago Type 2 diabetes mellitus with other specified complication, without long-term current use of insulin [facility])   [place] [facility] Merita Norton T, FNP   1 year ago Pain in both lower extremities   [place] [facility] [place], [place], PA-C   1 year ago 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 [facility])   [place] [facility] Jacky Kindle, FNP   1 year ago Annual physical exam   [facility] Jacky Kindle, FNP       Future Appointments             In 2 months Ostwalt, Edmon Crape, PA-C [facility]

## 2023-12-07 MED ORDER — BLOOD GLUCOSE TEST STRIPS 333 VI STRP: ORAL_STRIP | 2 refills | Status: DC

## 2023-12-07 MED ORDER — BLOOD GLUCOSE TEST STRIPS 333 VI STRP: ORAL_STRIP | 1 refills | Status: DC

## 2023-12-07 NOTE — Addendum Note (Signed)
 Addended by: Debera Lat on: 12/07/2023 09:40 AM   Modules accepted: Orders

## 2023-12-07 NOTE — Addendum Note (Signed)
 Addended by: Debera Lat on: 12/07/2023 09:35 AM   Modules accepted: Orders

## 2023-12-10 ENCOUNTER — Other Ambulatory Visit: Payer: Self-pay

## 2023-12-10 ENCOUNTER — Telehealth: Payer: Self-pay | Admitting: Physician Assistant

## 2023-12-10 DIAGNOSIS — E1169 Type 2 diabetes mellitus with other specified complication: Secondary | ICD-10-CM

## 2023-12-10 MED ORDER — BLOOD GLUCOSE TEST STRIPS 333 VI STRP: ORAL_STRIP | 2 refills | Status: AC

## 2023-12-10 NOTE — Telephone Encounter (Addendum)
 Prescription sent on 12/02/23 for 240 tabs. This is a 60 day supply. Dispense history shows it was filled. It is too soon for refill. Please update next prescription to a 90 day prescription of 360 tabs.

## 2023-12-10 NOTE — Telephone Encounter (Signed)
 Copied from CRM [id number]. Topic: Clinical - Prescription Issue >> Dec 10, 2023  9:26 AM Turkey B wrote: Reason for CRM: allison from [employer] called in , needs more directions on pt using glucose strips,. It doesn't say how many times a day she is to check it

## 2023-12-10 NOTE — Telephone Encounter (Signed)
 [facility] - [address]  is requesting refills on Metformin  500 mg. #360

## 2023-12-24 ENCOUNTER — Telehealth: Payer: Self-pay | Admitting: Physician Assistant

## 2023-12-24 DIAGNOSIS — E7800 Pure hypercholesterolemia, unspecified: Secondary | ICD-10-CM

## 2023-12-24 NOTE — Telephone Encounter (Signed)
 [facility] is requesting refill pravastatin (PRAVACHOL) 40 MG tablet  Please advise

## 2023-12-25 MED ORDER — PRAVASTATIN SODIUM 40 MG PO TABS
40.0000 mg | ORAL_TABLET | Freq: Every day | ORAL | 1 refills | Status: DC
Start: 2023-12-25 — End: 2024-06-17

## 2024-01-04 ENCOUNTER — Telehealth: Payer: Self-pay

## 2024-01-04 NOTE — Telephone Encounter (Signed)
 Patient was identified as falling into the [place] Measure - Diabetes.   Patient was: Appointment scheduled for lab or office visit for A1c.

## 2024-01-07 ENCOUNTER — Telehealth: Payer: Self-pay | Admitting: Physician Assistant

## 2024-01-07 ENCOUNTER — Telehealth: Payer: Self-pay

## 2024-01-07 NOTE — Telephone Encounter (Signed)
 Confirmed with patient she is no longer taking Ozempic   Copied from CRM [id number]. Topic: General - Other >> Jan 07, 2024 12:08 PM Antwanette L wrote: Reason for CRM: Patient received a vm saying her Ozempic is ready for pick up. The patient said she does not take Ozempic and is currently talking [place]. Please call the patient at [phone]

## 2024-01-07 NOTE — Telephone Encounter (Signed)
 LVM patient that Ozempic has arrived and is ready for pick up

## 2024-01-08 ENCOUNTER — Telehealth: Payer: Self-pay | Admitting: Physician Assistant

## 2024-01-08 DIAGNOSIS — I10 Essential (primary) hypertension: Secondary | ICD-10-CM

## 2024-01-08 MED ORDER — AMLODIPINE BESYLATE 10 MG PO TABS
10.0000 mg | ORAL_TABLET | Freq: Every day | ORAL | 0 refills | Status: DC
Start: 2024-01-08 — End: 2024-02-10

## 2024-01-08 MED ORDER — LOSARTAN POTASSIUM 100 MG PO TABS: 100.0000 mg | ORAL_TABLET | Freq: Every day | ORAL | 0 refills | Status: DC

## 2024-01-08 MED ORDER — LORATADINE 10 MG PO TABS: 10.0000 mg | ORAL_TABLET | Freq: Every day | ORAL | 0 refills | Status: DC

## 2024-01-08 NOTE — Telephone Encounter (Addendum)
 [facility] faxed refill request for the following medications:   amLODipine (NORVASC) 10 MG tablet     losartan (COZAAR) 100 MG tablet    loratadine (CLARITIN) 10 MG tablet     Please advise.

## 2024-02-05 ENCOUNTER — Ambulatory Visit (INDEPENDENT_AMBULATORY_CARE_PROVIDER_SITE_OTHER): Payer: Self-pay | Admitting: Physician Assistant

## 2024-02-05 ENCOUNTER — Encounter: Payer: Self-pay | Admitting: Physician Assistant

## 2024-02-05 ENCOUNTER — Other Ambulatory Visit: Payer: Self-pay | Admitting: Physician Assistant

## 2024-02-05 VITALS — BP 125/78 | HR 70 | Resp 16 | Ht 65.0 in | Wt 261.0 lb

## 2024-02-05 DIAGNOSIS — E785 Hyperlipidemia, unspecified: Secondary | ICD-10-CM

## 2024-02-05 DIAGNOSIS — Z7984 Long term (current) use of oral hypoglycemic drugs: Secondary | ICD-10-CM

## 2024-02-05 DIAGNOSIS — E7800 Pure hypercholesterolemia, unspecified: Secondary | ICD-10-CM

## 2024-02-05 DIAGNOSIS — I10 Essential (primary) hypertension: Secondary | ICD-10-CM | POA: Diagnosis not present

## 2024-02-05 DIAGNOSIS — E1165 Type 2 diabetes mellitus with hyperglycemia: Secondary | ICD-10-CM

## 2024-02-05 DIAGNOSIS — B353 Tinea pedis: Secondary | ICD-10-CM | POA: Diagnosis not present

## 2024-02-05 DIAGNOSIS — R748 Abnormal levels of other serum enzymes: Secondary | ICD-10-CM

## 2024-02-05 DIAGNOSIS — I152 Hypertension secondary to endocrine disorders: Secondary | ICD-10-CM

## 2024-02-05 DIAGNOSIS — E1169 Type 2 diabetes mellitus with other specified complication: Secondary | ICD-10-CM

## 2024-02-05 DIAGNOSIS — E1159 Type 2 diabetes mellitus with other circulatory complications: Secondary | ICD-10-CM | POA: Diagnosis not present

## 2024-02-05 NOTE — Progress Notes (Signed)
 Established patient visit  Patient: Jodi Becker   DOB: 1956-10-11   68 y.o. Female  MRN: [id number] Visit Date: 02/05/2024  Today's healthcare provider: Blane Bunting, PA-C   Chief Complaint  Patient presents with   Follow-up    3 mth f/u  wants to discuss new meds for DM    Subjective    Discussed the use of AI scribe software for clinical note transcription with the patient, who gave verbal consent to proceed.  History of Present Illness The patient, with a history of diabetes and hypertension, presents for a follow-up visit. She reports recent changes in her diabetes management, including discontinuation of metformin  and Ozempic  due to gastrointestinal side effects. She is currently on glipizide  10 mg daily and metformin  1000 mg twice daily, and has recently started a new medication, Farxiga. She reports that her blood sugar levels have been fluctuating, with a recent reading of 199 mg/dL, the highest it has been this week. She also notes that she has been experiencing increased urination since starting Farxiga.  In addition to her diabetes, the patient is also managing her hypertension with amlodipine  10 mcg, losartan  100 mg, and hydrochlorothiazide  50 mg daily. She reports that her blood pressure readings have been stable. However, she has been on hydrochlorothiazide  50 mg for several years, which is concerning due to potential electrolyte imbalance.  The patient also mentions a recent weight gain after discontinuing Ozempic , and expresses a desire to lose weight. She reports trying to diet and walking 3-4 times a week. She also has a history of fungal foot infections, which she manages with antifungal cream and regular podiatry visits.       11/05/2023    8:21 AM 10/24/2022    8:13 AM 07/25/2022    8:18 AM  Depression screen PHQ 2/9  Decreased Interest 0 0 0  Down, Depressed, Hopeless 0 0 0  PHQ - 2 Score 0 0 0  Altered sleeping  0 0  Tired, decreased energy  0 0   Change in appetite  0 0  Feeling bad or failure about yourself   0 0  Trouble concentrating  0 0  Moving slowly or fidgety/restless  0 0  Suicidal thoughts  0 0  PHQ-9 Score  0 0  Difficult doing work/chores  Not difficult at all Not difficult at all       No data to display          Medications: Outpatient Medications Prior to Visit  Medication Sig   amLODipine  (NORVASC ) 10 MG tablet Take 1 tablet (10 mg total) by mouth daily.   aspirin 81 MG chewable tablet Chew by mouth.   b complex vitamins capsule Take 1 capsule by mouth daily.   Biotin 5000 MCG TABS Take 2 tablets by mouth.   Cholecalciferol (VITAMIN D3) 1000 UNITS CAPS Take 2,000 Units by mouth daily.   gabapentin  (NEURONTIN ) 100 MG capsule Take 1 capsule (100 mg total) by mouth at bedtime.   glipiZIDE  (GLUCOTROL  XL) 10 MG 24 hr tablet Take 2 tablets (20 mg total) by mouth daily with breakfast.   Glucose Blood (BLOOD GLUCOSE TEST STRIPS 333) STRP To test daily and as needed for abnormal reading   hydrochlorothiazide  (HYDRODIURIL ) 50 MG tablet Take 1 tablet by mouth once daily   Lancets (ONETOUCH DELICA PLUS LANCET33G) MISC USE TO CHECK BLOOD SUGAR UP TO 4 TIMES DAILY   loratadine  (CLARITIN ) 10 MG tablet Take 1 tablet (10 mg total) by mouth daily.  losartan  (COZAAR ) 100 MG tablet Take 1 tablet (100 mg total) by mouth daily.   Magnesium 100 MG TABS Take 100 mg by mouth daily.   mineral oil-hydrophilic petrolatum (AQUAPHOR) ointment Apply topically as needed for dry skin.   naloxegol  oxalate (MOVANTIK ) 12.5 MG TABS tablet Take 1 tablet (12.5 mg total) by mouth daily.   nystatin -triamcinolone  ointment (MYCOLOG) Apply 1 Application topically 2 (two) times daily.   pravastatin  (PRAVACHOL ) 40 MG tablet Take 1 tablet (40 mg total) by mouth daily.   vitamin C (ASCORBIC ACID) 500 MG tablet Take 500 mg by mouth daily.   metFORMIN  (GLUCOPHAGE ) 500 MG tablet Take 2 tablets (1,000 mg total) by mouth 2 (two) times daily. (Patient not  taking: Reported on 02/05/2024)   Semaglutide  (RYBELSUS ) 3 MG TABS Take 1 tablet (3 mg total) by mouth daily. (Patient not taking: Reported on 02/05/2024)   No facility-administered medications prior to visit.    Review of Systems All negative Except see HPI       Objective    BP 125/78 (BP Location: Left Arm, Patient Position: Sitting, Cuff Size: Normal)   Pulse 70   Resp 16   Ht 5\' 5"  (1.651 m)   Wt 261 lb (118.4 kg)   SpO2 98%   BMI 43.43 kg/m     Physical Exam Vitals reviewed.  Constitutional:      General: She is not in acute distress.    Appearance: Normal appearance. She is well-developed. She is not diaphoretic.  HENT:     Head: Normocephalic and atraumatic.  Eyes:     General: No scleral icterus.    Conjunctiva/sclera: Conjunctivae normal.  Neck:     Thyroid : No thyromegaly.  Cardiovascular:     Rate and Rhythm: Normal rate and regular rhythm.     Pulses: Normal pulses.     Heart sounds: Normal heart sounds. No murmur heard. Pulmonary:     Effort: Pulmonary effort is normal. No respiratory distress.     Breath sounds: Normal breath sounds. No wheezing, rhonchi or rales.  Musculoskeletal:     Cervical back: Neck supple.     Right lower leg: No edema.     Left lower leg: No edema.  Lymphadenopathy:     Cervical: No cervical adenopathy.  Skin:    General: Skin is warm and dry.     Findings: Lesion present. No rash.  Neurological:     Mental Status: She is alert and oriented to person, place, and time. Mental status is at baseline.  Psychiatric:        Mood and Affect: Mood normal.        Behavior: Behavior normal.      No results found for any visits on 02/05/24.      Assessment & Plan Type 2 diabetes mellitus with other specified complication, without long-term current use of insulin (HCC) (Primary)  - Lipid panel - Hemoglobin A1c - Comprehensive metabolic panel with GFR  Benign hypertension  - Lipid panel - Hemoglobin A1c -  Comprehensive metabolic panel with GFR   Pure hypercholesterolemia Chronic Ordered - Lipid pane - Hemoglobin A1c - Comprehensive metabolic panel with GFR Will reassess after  receiving lab results Will FU  Type 2 diabetes mellitus with hyperglycemia Chronic Blood sugar elevated at 199 mg/dL. On metformin , glipizide , Elvina Hammers. Previous Ozempic  use discontinued due to GI side effects and weight gain. Encouraged weight loss. - Order blood work to monitor glucose levels. - Encourage weight loss through diet and exercise. -  Bring all medications to next appointment for review. Will follow-up  Hypertension Chronic Blood pressure well-controlled. High dose hydrochlorothiazide  (50 mg) raises concerns for electrolyte imbalance. - Order blood work to monitor electrolyte levels. Will follow-up  Elevated liver enzymes Liver enzymes elevated without identified cause. - Monitor liver function through blood work.  Fungal infection of the feet Fungal infection with itching. Under podiatric care for toenail management. Advised on foot care regimen.  Advised to use  - Aluminum acetate soak (Burow's solution; [place], 1 pack to 1 quart warm water) to decrease itching and acute eczematous reaction  - Antifungal cream of choice BID after soaks (allylamines slightly more effective than azoles) -  Perform careful removal of dead/thickened skin after soaking or bathing - Treat shoes with antifungal powders - Avoid occlusive footwear Perform careful removal of dead/thickened skin after soaking or bathing The patient was advised to call back or seek an in-person evaluation if the symptoms worsen or if the condition fails to improve as anticipated.  - Use antifungal cream daily. - Soak feet in recommended solution. - Moisturize feet regularly.  No orders of the defined types were placed in this encounter.   No follow-ups on file.   The patient was advised to call back or seek an in-person  evaluation if the symptoms worsen or if the condition fails to improve as anticipated.  I discussed the assessment and treatment plan with the patient. The patient was provided an opportunity to ask questions and all were answered. The patient agreed with the plan and demonstrated an understanding of the instructions.  I, Shatira Dobosz, PA-C have reviewed all documentation for this visit. The documentation on 02/05/2024  for the exam, diagnosis, procedures, and orders are all accurate and complete.  Blane Bunting, [facility], MMS [facility] [phone] (phone) [phone] (fax)  [facility]

## 2024-02-05 NOTE — Telephone Encounter (Signed)
 Requested Prescriptions  Pending Prescriptions Disp Refills   metFORMIN  (GLUCOPHAGE ) 500 MG tablet [Pharmacy Med Name: metFORMIN  HCl 500 MG Oral Tablet] 240 tablet 0    Sig: Take 2 tablets by mouth twice daily     Endocrinology:  Diabetes - Biguanides Failed - 02/05/2024  2:48 PM      Failed - HBA1C is between 0 and 7.9 and within 180 days    Hgb A1c MFr Bld  Date Value Ref Range Status  11/05/2023 8.4 (H) <5.7 % of total Hgb Final    Comment:    For someone without known diabetes, a hemoglobin A1c value of 6.5% or greater indicates that they may have  diabetes and this should be confirmed with a follow-up  test. . For someone with known diabetes, a value <7% indicates  that their diabetes is well controlled and a value  greater than or equal to 7% indicates suboptimal  control. A1c targets should be individualized based on  duration of diabetes, age, comorbid conditions, and  other considerations. . Currently, no consensus exists regarding use of hemoglobin A1c for diagnosis of diabetes for children. .          Failed - B12 Level in normal range and within 720 days    No results found for: "VITAMINB12"       Passed - Cr in normal range and within 360 days    Creat  Date Value Ref Range Status  11/05/2023 0.73 0.50 - 1.05 mg/dL Final   Creatinine, Urine  Date Value Ref Range Status  11/05/2023 51 20 - 275 mg/dL Final         Passed - eGFR in normal range and within 360 days    GFR calc Af Amer  Date Value Ref Range Status  09/17/2020 89 >59 mL/min/1.73 Final    Comment:    **In accordance with recommendations from the NKF-ASN Task force,**   [facility] is in the process of updating its eGFR calculation to the   2021 CKD-EPI creatinine equation that estimates kidney function   without a race variable.    GFR calc non Af Amer  Date Value Ref Range Status  09/17/2020 77 >59 mL/min/1.73 Final   eGFR  Date Value Ref Range Status  11/05/2023 90 > OR = 60  mL/min/1.63m2 Final  04/28/2023 80 >59 mL/min/1.73 Final         Passed - Valid encounter within last 6 months    Recent Outpatient Visits           Today Type 2 diabetes mellitus with other specified complication, without long-term current use of insulin (HCC)   [facility] [place], Oto, PA-C              Passed - CBC within normal limits and completed in the last 12 months    WBC  Date Value Ref Range Status  11/05/2023 5.7 3.8 - 10.8 Thousand/uL Final   RBC  Date Value Ref Range Status  11/05/2023 5.00 3.80 - 5.10 Million/uL Final   Hemoglobin  Date Value Ref Range Status  11/05/2023 13.5 11.7 - 15.5 g/dL Final  16/07/9603 54.0 11.1 - 15.9 g/dL Final   HCT  Date Value Ref Range Status  11/05/2023 42.6 35.0 - 45.0 % Final   Hematocrit  Date Value Ref Range Status  04/28/2023 41.0 34.0 - 46.6 % Final   MCHC  Date Value Ref Range Status  11/05/2023 31.7 (L) 32.0 - 36.0 g/dL Final  Comment:    For adults, a slight decrease in the calculated MCHC value (in the range of 30 to 32 g/dL) is most likely not clinically significant; however, it should be interpreted with caution in correlation with other red cell parameters and the patient's clinical condition.    [facility]  Date Value Ref Range Status  11/05/2023 27.0 27.0 - 33.0 pg Final   MCV  Date Value Ref Range Status  11/05/2023 85.2 80.0 - 100.0 fL Final  04/28/2023 85 79 - 97 fL Final   No results found for: "PLTCOUNTKUC", "LABPLAT", "POCPLA" RDW  Date Value Ref Range Status  11/05/2023 13.3 11.0 - 15.0 % Final  04/28/2023 13.6 11.7 - 15.4 % Final

## 2024-02-06 LAB — COMPREHENSIVE METABOLIC PANEL WITH GFR
ALT: 34 IU/L — ABNORMAL HIGH (ref 0–32)
AST: 20 IU/L (ref 0–40)
Albumin: 4.4 g/dL (ref 3.9–4.9)
Alkaline Phosphatase: 90 IU/L (ref 44–121)
BUN/Creatinine Ratio: 24 (ref 12–28)
BUN: 20 mg/dL (ref 8–27)
Bilirubin Total: 0.4 mg/dL (ref 0.0–1.2)
CO2: 23 mmol/L (ref 20–29)
Calcium: 11.5 mg/dL — ABNORMAL HIGH (ref 8.7–10.3)
Chloride: 102 mmol/L (ref 96–106)
Creatinine, Ser: 0.84 mg/dL (ref 0.57–1.00)
Globulin, Total: 2.3 g/dL (ref 1.5–4.5)
Glucose: 163 mg/dL — ABNORMAL HIGH (ref 70–99)
Potassium: 4.4 mmol/L (ref 3.5–5.2)
Sodium: 141 mmol/L (ref 134–144)
Total Protein: 6.7 g/dL (ref 6.0–8.5)
eGFR: 76 mL/min/{1.73_m2} (ref >59)

## 2024-02-06 LAB — LIPID PANEL
Chol/HDL Ratio: 2.3 ratio (ref 0.0–4.4)
Cholesterol, Total: 135 mg/dL (ref 100–199)
HDL: 60 mg/dL (ref >39)
LDL Chol Calc (NIH): 56 mg/dL (ref 0–99)
Triglycerides: 103 mg/dL (ref 0–149)
VLDL Cholesterol Cal: 19 mg/dL (ref 5–40)

## 2024-02-06 LAB — HEMOGLOBIN A1C
Est. average glucose Bld gHb Est-mCnc: 220 mg/dL
Hgb A1c MFr Bld: 9.3 % — ABNORMAL HIGH (ref 4.8–5.6)

## 2024-02-07 DIAGNOSIS — R748 Abnormal levels of other serum enzymes: Secondary | ICD-10-CM | POA: Insufficient documentation

## 2024-02-07 DIAGNOSIS — B353 Tinea pedis: Secondary | ICD-10-CM | POA: Insufficient documentation

## 2024-02-08 ENCOUNTER — Other Ambulatory Visit: Payer: Self-pay | Admitting: Physician Assistant

## 2024-02-08 DIAGNOSIS — I10 Essential (primary) hypertension: Secondary | ICD-10-CM

## 2024-02-09 DIAGNOSIS — H524 Presbyopia: Secondary | ICD-10-CM | POA: Diagnosis not present

## 2024-02-09 DIAGNOSIS — H40013 Open angle with borderline findings, low risk, bilateral: Secondary | ICD-10-CM | POA: Diagnosis not present

## 2024-02-09 DIAGNOSIS — H35033 Hypertensive retinopathy, bilateral: Secondary | ICD-10-CM | POA: Diagnosis not present

## 2024-02-10 ENCOUNTER — Other Ambulatory Visit: Payer: Self-pay

## 2024-02-10 ENCOUNTER — Other Ambulatory Visit: Payer: Self-pay | Admitting: Physician Assistant

## 2024-02-10 DIAGNOSIS — E1169 Type 2 diabetes mellitus with other specified complication: Secondary | ICD-10-CM

## 2024-02-10 DIAGNOSIS — I10 Essential (primary) hypertension: Secondary | ICD-10-CM

## 2024-02-10 MED ORDER — LOSARTAN POTASSIUM 100 MG PO TABS: 100.0000 mg | ORAL_TABLET | Freq: Every day | ORAL | 1 refills | Status: DC

## 2024-02-10 MED ORDER — AMLODIPINE BESYLATE 10 MG PO TABS: 10.0000 mg | ORAL_TABLET | Freq: Every day | ORAL | 1 refills | Status: DC

## 2024-02-10 MED ORDER — LORATADINE 10 MG PO TABS: 10.0000 mg | ORAL_TABLET | Freq: Every day | ORAL | 1 refills | Status: DC

## 2024-02-10 NOTE — Telephone Encounter (Signed)
 Copied from CRM [id number]. Topic: Clinical - Prescription Issue >> Feb 10, 2024  3:29 PM Phil Braun wrote: Reason for CRM: Pt called and is upset that she only gets a 30 day supply she has always got a 90 day supply of her medication. It saves her money. She is requesting a call to advise, why. She stated that she also has an appt scheduled for follow up.

## 2024-02-11 ENCOUNTER — Encounter: Payer: Self-pay | Admitting: Physician Assistant

## 2024-02-16 ENCOUNTER — Ambulatory Visit: Payer: Self-pay

## 2024-02-16 NOTE — Telephone Encounter (Signed)
 Copied from CRM [id number]. Topic: Clinical - Lab/Test Results >> Feb 16, 2024  1:11 PM Ethelle Herb L wrote: Reason for CRM: Patient given lab results. Patient has additional questions.  Pt A1c would like to do what's best Reason for Disposition  Health Information question, no triage required and triager able to answer question  Answer Assessment - Initial Assessment Questions 1. REASON FOR CALL or QUESTION: "What is your reason for calling today?" or "How can I best help you?" or "What question do you have that I can help answer?" Pt called concerning labwork results: informed pt of the following:    " Your labwork results all are within normal limits.  Except elevated A1c = 9.0. We could increase glipizide  , metformin  or rybelsus  if you agree. Elevated liver enzyme, could be related with lovastatin use. Will recheck at fu Elevated Calcium, could be related with vit d and ca intake, lovastatin use, will follow-up during your next visit Any questions please reach out to the office or message me on MyChart!:"  Patient verbalized understanding and stated her response to the medication r/t A1C was: Pt agrees with whatever the PCP thinks is best for the patient at this time.  Protocols used: Information Only Call - No Triage-A-AH

## 2024-02-18 ENCOUNTER — Telehealth: Payer: Self-pay

## 2024-02-18 ENCOUNTER — Encounter: Payer: Self-pay | Admitting: Physician Assistant

## 2024-02-18 NOTE — Telephone Encounter (Signed)
 Copied from CRM [id number]. Topic: Appointments - Appointment Scheduling >> Feb 18, 2024  3:12 PM Turkey B wrote: Pt returned call to make a virtual appt for dm regimen. She already has an appt on 06/02. Does she need this dm appt before then? Please cb

## 2024-02-19 ENCOUNTER — Encounter: Payer: Self-pay | Admitting: Physician Assistant

## 2024-02-27 ENCOUNTER — Other Ambulatory Visit: Payer: Self-pay | Admitting: Physician Assistant

## 2024-02-27 DIAGNOSIS — E1159 Type 2 diabetes mellitus with other circulatory complications: Secondary | ICD-10-CM

## 2024-03-10 ENCOUNTER — Telehealth: Payer: Self-pay

## 2024-03-10 ENCOUNTER — Ambulatory Visit: Admitting: Physician Assistant

## 2024-03-10 NOTE — Telephone Encounter (Signed)
 Patient is going to bring the papers in for us  to review and see if we can help  Copied from CRM [id number]. Topic: General - Other >> Mar 10, 2024 10:26 AM Sasha H wrote: Reason for CRM: pt states her insurance will not cover her last lab work due to not having the right diagnostic coding and having missing information.

## 2024-03-10 NOTE — Telephone Encounter (Signed)
 Form given to provider

## 2024-03-10 NOTE — Telephone Encounter (Signed)
 Patient dropped off denial of payment from [employer] for 02/05/24 for Hgb. A1C test. Sending this back in mailbox.

## 2024-03-14 ENCOUNTER — Ambulatory Visit: Admitting: Physician Assistant

## 2024-04-12 DIAGNOSIS — B351 Tinea unguium: Secondary | ICD-10-CM | POA: Diagnosis not present

## 2024-04-12 DIAGNOSIS — M79675 Pain in left toe(s): Secondary | ICD-10-CM | POA: Diagnosis not present

## 2024-04-12 DIAGNOSIS — M79674 Pain in right toe(s): Secondary | ICD-10-CM | POA: Diagnosis not present

## 2024-04-12 NOTE — Progress Notes (Signed)
 Subjective: Pt. returns with painful toenails on all toes causing pressure in the shoes when walking.  Objective: All of the toenails are thick, dystrophic, discolored, and brittle with subungual debris.  Impression: Painful onychomycosis all toes.  Plan: Debridement of all toenails in length and thickness sharply using toenail nippers.  Pt will return PRN.

## 2024-04-14 ENCOUNTER — Ambulatory Visit: Admitting: Physician Assistant

## 2024-04-14 VITALS — BP 129/73 | HR 71 | Resp 16 | Ht 65.0 in | Wt 258.0 lb

## 2024-04-14 DIAGNOSIS — E1159 Type 2 diabetes mellitus with other circulatory complications: Secondary | ICD-10-CM

## 2024-04-14 DIAGNOSIS — Z7984 Long term (current) use of oral hypoglycemic drugs: Secondary | ICD-10-CM

## 2024-04-14 DIAGNOSIS — I152 Hypertension secondary to endocrine disorders: Secondary | ICD-10-CM

## 2024-04-14 DIAGNOSIS — E1169 Type 2 diabetes mellitus with other specified complication: Secondary | ICD-10-CM

## 2024-04-14 NOTE — Progress Notes (Signed)
 Established patient visit  Patient: Jodi Becker   DOB: 28-May-1956   68 y.o. Female  MRN: [id number] Visit Date: 04/14/2024  Today's healthcare provider: Jolynn Spencer, PA-C   Chief Complaint  Patient presents with   Follow-up    1 month f/u no other concerns   Subjective     Discussed the use of AI scribe software for clinical note transcription with the patient, who gave verbal consent to proceed.  History of Present Illness Jodi Becker is a 68 year old female with diabetes and hypertension who presents for medication management and dietary guidance.  She experiences difficulty managing diabetes and hypertension, with elevated blood sugar levels and a recent A1c of 9.3. Her blood sugar was 166 mg/dL, influenced by dietary choices such as a [place] burger and potato salad. She acknowledges the need for smaller, frequent meals and to avoid night eating, which she finds challenging.  She is concerned about medication and dietary costs and is interested in programs to assist with healthcare expenses, mentioning her insurance, [employer]. She is in contact with the clinic's pharmacy and receives most medications from [facility] pharmacy. She is open to discussing her medication regimen with a pharmacist for potential adjustments.  She has been on hydrochlorothiazide  50 mg for hypertension for at least three years, with the dosage increased about five years ago. She enjoys cooking and often prepares meals for others, which complicates her dietary management. She likes salty foods but does not consume excessive salt. Her last blood work in April showed elevated liver enzymes and high blood sugar.      02/05/2024    8:43 AM 11/05/2023    8:21 AM 10/24/2022    8:13 AM  Depression screen PHQ 2/9  Decreased Interest 0 0 0  Down, Depressed, Hopeless 0 0 0  PHQ - 2 Score 0 0 0  Altered sleeping 0  0  Tired, decreased energy 0  0  Change in appetite 0  0  Feeling bad or  failure about yourself  0  0  Trouble concentrating 0  0  Moving slowly or fidgety/restless 0  0  Suicidal thoughts 0  0  PHQ-9 Score 0  0  Difficult doing work/chores Not difficult at all  Not difficult at all      02/05/2024    8:43 AM  GAD 7 : Generalized Anxiety Score  Nervous, Anxious, on Edge 0  Control/stop worrying 0  Worry too much - different things 0  Trouble relaxing 0  Restless 0  Easily annoyed or irritable 0  Afraid - awful might happen 0  Total GAD 7 Score 0  Anxiety Difficulty Not difficult at all    Medications: Outpatient Medications Prior to Visit  Medication Sig   amLODipine  (NORVASC ) 10 MG tablet Take 1 tablet (10 mg total) by mouth daily. TAKE 1 TABLET BY MOUTH ONCE DAILY.   aspirin 81 MG chewable tablet Chew by mouth.   b complex vitamins capsule Take 1 capsule by mouth daily.   Biotin 5000 MCG TABS Take 2 tablets by mouth.   Cholecalciferol (VITAMIN D3) 1000 UNITS CAPS Take 2,000 Units by mouth daily.   gabapentin  (NEURONTIN ) 100 MG capsule Take 1 capsule (100 mg total) by mouth at bedtime.   glipiZIDE  (GLUCOTROL  XL) 10 MG 24 hr tablet TAKE 2 TABLETS BY MOUTH ONCE DAILY WITH BREAKFAST   Glucose Blood (BLOOD GLUCOSE TEST STRIPS 333) STRP To test daily and as needed for abnormal reading  hydrochlorothiazide  (HYDRODIURIL ) 50 MG tablet Take 1 tablet by mouth once daily   Lancets (ONETOUCH DELICA PLUS LANCET33G) MISC USE TO CHECK BLOOD SUGAR UP TO 4 TIMES DAILY   loratadine  (CLARITIN ) 10 MG tablet Take 1 tablet (10 mg total) by mouth daily. Take 1 tablet by mouth once daily   losartan  (COZAAR ) 100 MG tablet Take 1 tablet (100 mg total) by mouth daily. TAKE 1 TABLET BY MOUTH ONCE DAILY .   Magnesium 100 MG TABS Take 100 mg by mouth daily.   metFORMIN  (GLUCOPHAGE ) 500 MG tablet Take 2 tablets by mouth twice daily   mineral oil-hydrophilic petrolatum (AQUAPHOR) ointment Apply topically as needed for dry skin.   naloxegol  oxalate (MOVANTIK ) 12.5 MG TABS tablet  Take 1 tablet (12.5 mg total) by mouth daily.   nystatin -triamcinolone  ointment (MYCOLOG) Apply 1 Application topically 2 (two) times daily.   pravastatin  (PRAVACHOL ) 40 MG tablet Take 1 tablet (40 mg total) by mouth daily.   Semaglutide  (RYBELSUS ) 3 MG TABS Take 1 tablet (3 mg total) by mouth daily. (Patient not taking: Reported on 02/05/2024)   vitamin C (ASCORBIC ACID) 500 MG tablet Take 500 mg by mouth daily.   No facility-administered medications prior to visit.    Review of Systems All negative Except see HPI       Objective    BP 129/73 (BP Location: Left Arm, Patient Position: Sitting, Cuff Size: Normal)   Pulse 71   Resp 16   Ht 5' 5 (1.651 m)   Wt 258 lb (117 kg)   SpO2 98%   BMI 42.93 kg/m     Physical Exam Vitals reviewed.  Constitutional:      General: She is not in acute distress.    Appearance: Normal appearance. She is well-developed. She is obese. She is not diaphoretic.  HENT:     Head: Normocephalic and atraumatic.  Eyes:     General: No scleral icterus.    Conjunctiva/sclera: Conjunctivae normal.  Neck:     Thyroid : No thyromegaly.  Cardiovascular:     Rate and Rhythm: Normal rate and regular rhythm.     Pulses: Normal pulses.     Heart sounds: Normal heart sounds. No murmur heard. Pulmonary:     Effort: Pulmonary effort is normal. No respiratory distress.     Breath sounds: Normal breath sounds. No wheezing, rhonchi or rales.  Musculoskeletal:     Cervical back: Neck supple.     Right lower leg: No edema.     Left lower leg: No edema.  Lymphadenopathy:     Cervical: No cervical adenopathy.  Skin:    General: Skin is warm and dry.     Findings: No rash.  Neurological:     Mental Status: She is alert and oriented to person, place, and time. Mental status is at baseline.  Psychiatric:        Mood and Affect: Mood normal.        Behavior: Behavior normal.      No results found for any visits on 04/14/24.      Assessment & Plan Type  2 Diabetes Mellitus Chronic Poor glycemic control with A1c of 9.3 and blood glucose of 166 mg/dL. Dietary management challenges noted. Emphasized dietary modifications and nutritionist consultation.Pt is currently on glipizide  10mg  BID, metformin  2000mg ,  - Pt declined a referral to a nutritionist for dietary counseling. She is planning to communicate with nutrition program through her insurance - Encourage use of American Diabetes Association resources. -  Advise frequent, smaller meals and avoidance of night eating. - Plan follow-up blood work in one month to reassess glycemic control with current med regimen and strict low carb diet Per pt she has been taking farxiga 10mg  , previously had a trial of rybelsus . Will needs sooner appointment with daily sugar not at goal. Will follow-up  Hypertension Chronic Managed with hydrochlorothiazide  50 mg x 3 years, amlodipine  10mg , losartan  100mg . Discussed need for regular monitoring for hypokalemia, renal function, hyperuricemia and salt intake reduction. Last GFR 76 - Refer to a hypertensive clinic for medication review. - Advise reduction of salt intake. - Consult with clinic pharmacist regarding medication adjustments. Reviewed labs from 02/05/24 with elevated liver enzyme Communicate with clinic pharmacy regarding changing her medications. Advised to bring all medication to the next appointment Will follow-up  Hypercalcemia Chronic x 3 years Asymptomatic PTH in the past was WNL Will recheck pth at the follow-up Will recheck with HTN clinic for adjustment of HTN meds  Hyperlipidemia Chronic Continue pravastatin  40mg  daily, lifestyle modifications Will follow-up  General Health Maintenance Encouraged engagement with insurance health programs for support in health management. - Encourage contact with insurance provider to explore health programs and support for nutritionist consultations.  Follow-up Scheduled follow-up to monitor  diabetes and hypertension management. - Schedule follow-up appointment in one month for blood work and reassessment of diabetes and hypertension management.   No orders of the defined types were placed in this encounter.   No follow-ups on file.   The patient was advised to call back or seek an in-person evaluation if the symptoms worsen or if the condition fails to improve as anticipated.  I discussed the assessment and treatment plan with the patient. The patient was provided an opportunity to ask questions and all were answered. The patient agreed with the plan and demonstrated an understanding of the instructions.  I, Patirica Longshore, PA-C have reviewed all documentation for this visit. The documentation on 04/14/2024  for the exam, diagnosis, procedures, and orders are all accurate and complete.  Jolynn Spencer, [facility], MMS [facility] [phone] (phone) [phone] (fax)  [facility]

## 2024-04-18 ENCOUNTER — Encounter: Payer: Self-pay | Admitting: Physician Assistant

## 2024-04-20 ENCOUNTER — Telehealth: Payer: Self-pay | Admitting: Physician Assistant

## 2024-04-20 NOTE — Telephone Encounter (Signed)
 Pt was informed that Ozempic  #4 has arrived and is ready for pick up

## 2024-05-03 ENCOUNTER — Other Ambulatory Visit: Payer: Self-pay | Admitting: Physician Assistant

## 2024-05-03 DIAGNOSIS — E1165 Type 2 diabetes mellitus with hyperglycemia: Secondary | ICD-10-CM

## 2024-05-04 ENCOUNTER — Other Ambulatory Visit: Payer: Self-pay | Admitting: Physician Assistant

## 2024-05-04 DIAGNOSIS — E1165 Type 2 diabetes mellitus with hyperglycemia: Secondary | ICD-10-CM

## 2024-05-09 ENCOUNTER — Other Ambulatory Visit: Payer: Self-pay | Admitting: Physician Assistant

## 2024-05-09 DIAGNOSIS — E1169 Type 2 diabetes mellitus with other specified complication: Secondary | ICD-10-CM

## 2024-05-10 ENCOUNTER — Telehealth: Payer: Self-pay

## 2024-05-10 NOTE — Telephone Encounter (Signed)
 Patient was identified as falling into the [place] Measure - Diabetes.   Patient was: Appointment already scheduled for:  05/30/2024. Patient was offered referral on last visit and declined.

## 2024-05-12 ENCOUNTER — Telehealth: Payer: Self-pay | Admitting: Pharmacist

## 2024-05-12 ENCOUNTER — Telehealth: Payer: Self-pay

## 2024-05-12 DIAGNOSIS — E1169 Type 2 diabetes mellitus with other specified complication: Secondary | ICD-10-CM

## 2024-05-12 MED ORDER — DAPAGLIFLOZIN PROPANEDIOL 10 MG PO TABS: 10.0000 mg | ORAL_TABLET | Freq: Every day | ORAL | 3 refills | Status: DC

## 2024-05-12 NOTE — Progress Notes (Signed)
   05/12/2024  Patient ID: Jodi Becker, female   DOB: Dec 04, 1955, 68 y.o.   MRN: [id number]  Received message regarding needing refills for Farxiga  PAP. Sending refill to [facility] now.   Aloysius Lewis, PharmD [facility]  Phone Number: [phone]

## 2024-05-12 NOTE — Telephone Encounter (Signed)
 Received a refill request AZ&ME to be fax to [facility] at [phone]

## 2024-05-26 ENCOUNTER — Other Ambulatory Visit: Payer: Self-pay | Admitting: Physician Assistant

## 2024-05-26 DIAGNOSIS — E1159 Type 2 diabetes mellitus with other circulatory complications: Secondary | ICD-10-CM

## 2024-05-30 ENCOUNTER — Encounter: Payer: Self-pay | Admitting: Physician Assistant

## 2024-05-30 ENCOUNTER — Ambulatory Visit (INDEPENDENT_AMBULATORY_CARE_PROVIDER_SITE_OTHER): Admitting: Physician Assistant

## 2024-05-30 VITALS — BP 130/78 | HR 72 | Ht 69.0 in | Wt 259.9 lb

## 2024-05-30 DIAGNOSIS — E785 Hyperlipidemia, unspecified: Secondary | ICD-10-CM

## 2024-05-30 DIAGNOSIS — E1159 Type 2 diabetes mellitus with other circulatory complications: Secondary | ICD-10-CM | POA: Diagnosis not present

## 2024-05-30 DIAGNOSIS — E1169 Type 2 diabetes mellitus with other specified complication: Secondary | ICD-10-CM | POA: Diagnosis not present

## 2024-05-30 DIAGNOSIS — Z7984 Long term (current) use of oral hypoglycemic drugs: Secondary | ICD-10-CM | POA: Diagnosis not present

## 2024-05-30 DIAGNOSIS — I152 Hypertension secondary to endocrine disorders: Secondary | ICD-10-CM

## 2024-05-30 NOTE — Progress Notes (Signed)
 Established patient visit  Patient: Jodi Becker   DOB: 29-Sep-1956   68 y.o. Female  MRN: [id number] Visit Date: 05/30/2024  Today's healthcare provider: Jolynn Spencer, PA-C   Chief Complaint  Patient presents with   Medical Management of Chronic Issues    She was last seen 04/14/24 and advised to return in one month for blood work and to have blood sugar and hypertension reassessed.    Diabetes    Blood sugar reading of 172 mg/dL this morning. She reports symptoms of fatigue. Her last eye exam was first of the year.    Hypertension    Patient reports she monitors on occasions. She reports reading this morning 138/82 with pulse of 64. She reports she is able to know when blood pressure is high due to her head will start hurting. She reports lower leg edema but it will go down at night   Subjective     HPI     Medical Management of Chronic Issues    Additional comments: She was last seen 04/14/24 and advised to return in one month for blood work and to have blood sugar and hypertension reassessed.         Diabetes    Additional comments: Blood sugar reading of 172 mg/dL this morning. She reports symptoms of fatigue. Her last eye exam was first of the year.         Hypertension    Additional comments: Patient reports she monitors on occasions. She reports reading this morning 138/82 with pulse of 64. She reports she is able to know when blood pressure is high due to her head will start hurting. She reports lower leg edema but it will go down at night      Last edited by Lilian Fitzpatrick, CMA on 05/30/2024  8:23 AM.       Discussed the use of AI scribe software for clinical note transcription with the patient, who gave verbal consent to proceed.  History of Present Illness Jodi Becker is a 68 year old female with type 2 diabetes who presents for management of her blood sugar levels.  She manages her type 2 diabetes with glipizide , metformin , and Farxiga .  Her blood sugar was 172 mg/dL this morning before eating. She typically checks her blood sugar in the morning before meals and occasionally after meals if levels are high. She takes glipizide  twice daily, both doses in the morning, and metformin  500 mg four times daily. She is attempting to manage her diet by reducing sweets and substituting fruits like watermelon, cantaloupe, banana, and strawberries, though she acknowledges these can increase her blood sugar levels.  She has not engaged with a nutritionist recently due to financial concerns, as she lives on [address], and has upcoming dental expenses.  She has hypertension and reports that her blood pressure at home is manageable, providing some home readings. She does not take flu shots due to concerns about sinus irritation.  She has seen a podiatrist recently for toenail trimming and callus shaving but prefers a female provider. No significant foot issues have been noted. She last saw an eye doctor at the beginning of the year and received new glasses. No double vision or blurry vision. She manages sinus issues with medication.       02/05/2024    8:43 AM 11/05/2023    8:21 AM 10/24/2022    8:13 AM  Depression screen PHQ 2/9  Decreased Interest 0 0 0  Down, Depressed,  Hopeless 0 0 0  PHQ - 2 Score 0 0 0  Altered sleeping 0  0  Tired, decreased energy 0  0  Change in appetite 0  0  Feeling bad or failure about yourself  0  0  Trouble concentrating 0  0  Moving slowly or fidgety/restless 0  0  Suicidal thoughts 0  0  PHQ-9 Score 0  0  Difficult doing work/chores Not difficult at all  Not difficult at all      02/05/2024    8:43 AM  GAD 7 : Generalized Anxiety Score  Nervous, Anxious, on Edge 0  Control/stop worrying 0  Worry too much - different things 0  Trouble relaxing 0  Restless 0  Easily annoyed or irritable 0  Afraid - awful might happen 0  Total GAD 7 Score 0  Anxiety Difficulty Not difficult at all     Medications: Outpatient Medications Prior to Visit  Medication Sig   amLODipine  (NORVASC ) 10 MG tablet Take 1 tablet (10 mg total) by mouth daily. TAKE 1 TABLET BY MOUTH ONCE DAILY.   aspirin 81 MG chewable tablet Chew by mouth.   b complex vitamins capsule Take 1 capsule by mouth daily.   Biotin 5000 MCG TABS Take 2 tablets by mouth.   Cholecalciferol (VITAMIN D3) 1000 UNITS CAPS Take 2,000 Units by mouth daily.   dapagliflozin  propanediol (FARXIGA ) 10 MG TABS tablet Take 1 tablet (10 mg total) by mouth daily.   gabapentin  (NEURONTIN ) 100 MG capsule Take 1 capsule (100 mg total) by mouth at bedtime.   glipiZIDE  (GLUCOTROL  XL) 10 MG 24 hr tablet TAKE 2 TABLETS BY MOUTH ONCE DAILY WITH BREAKFAST   Glucose Blood (BLOOD GLUCOSE TEST STRIPS 333) STRP To test daily and as needed for abnormal reading   hydrochlorothiazide  (HYDRODIURIL ) 50 MG tablet Take 1 tablet by mouth once daily   Lancets (ONETOUCH DELICA PLUS LANCET33G) MISC USE TO CHECK BLOOD SUGAR UP TO 4 TIMES DAILY   loratadine  (CLARITIN ) 10 MG tablet Take 1 tablet (10 mg total) by mouth daily. Take 1 tablet by mouth once daily   losartan  (COZAAR ) 100 MG tablet Take 1 tablet (100 mg total) by mouth daily. TAKE 1 TABLET BY MOUTH ONCE DAILY .   Magnesium 100 MG TABS Take 100 mg by mouth daily.   metFORMIN  (GLUCOPHAGE ) 500 MG tablet Take 2 tablets by mouth twice daily   mineral oil-hydrophilic petrolatum (AQUAPHOR) ointment Apply topically as needed for dry skin.   naloxegol  oxalate (MOVANTIK ) 12.5 MG TABS tablet Take 1 tablet (12.5 mg total) by mouth daily.   nystatin -triamcinolone  ointment (MYCOLOG) Apply 1 Application topically 2 (two) times daily.   pravastatin  (PRAVACHOL ) 40 MG tablet Take 1 tablet (40 mg total) by mouth daily.   vitamin C (ASCORBIC ACID) 500 MG tablet Take 500 mg by mouth daily.   No facility-administered medications prior to visit.    Review of Systems All negative Except see HPI       Objective    BP  130/78 (BP Location: Right Arm, Patient Position: Sitting, Cuff Size: Normal)   Pulse 72   Ht 5' 9 (1.753 m)   Wt 259 lb 14.4 oz (117.9 kg)   SpO2 100%   BMI 38.38 kg/m     Physical Exam Vitals reviewed.  Constitutional:      General: She is not in acute distress.    Appearance: Normal appearance. She is well-developed. She is not diaphoretic.  HENT:  Head: Normocephalic and atraumatic.  Eyes:     General: No scleral icterus.    Conjunctiva/sclera: Conjunctivae normal.  Neck:     Thyroid : No thyromegaly.  Cardiovascular:     Rate and Rhythm: Normal rate and regular rhythm.     Pulses: Normal pulses.     Heart sounds: Normal heart sounds. No murmur heard. Pulmonary:     Effort: Pulmonary effort is normal. No respiratory distress.     Breath sounds: Normal breath sounds. No wheezing, rhonchi or rales.  Musculoskeletal:     Cervical back: Neck supple.     Right lower leg: No edema.     Left lower leg: No edema.  Lymphadenopathy:     Cervical: No cervical adenopathy.  Skin:    General: Skin is warm and dry.     Findings: No rash.  Neurological:     Mental Status: She is alert and oriented to person, place, and time. Mental status is at baseline.  Psychiatric:        Mood and Affect: Mood normal.        Behavior: Behavior normal.      No results found for any visits on 05/30/24.      Assessment and Plan Assessment & Plan Adult Wellness Visit Due for routine health maintenance. Declined flu shot due to sinus irritation concerns. Believed up to date on COVID vaccinations. Dissatisfied with current podiatrist. Issue with receiving information from diabetic eye doctor. - Perform lab work. - Schedule follow-up appointment in 3 months. - Discuss seeing a female podiatrist if available.  Type 2 diabetes mellitus Chronic and unstable  diabetes managed with glipizide  20, metformin  2000 units, and Farxiga  10. Fasting blood sugar was 172 mg/dL. Dietary changes and  medication adjustments discussed. Hesitant to see a nutritionist due to financial constraints. Consulted clinic pharmacist for management. - Increase glipizide  to twice daily, morning and evening. - Schedule appointment with clinic pharmacist for diabetes management. - Encourage strict dietary changes, particularly reducing sweets and high-sugar fruits. - Check with insurance for potential weight loss classes. Will follow-up   Hypertension Chronic but blood pressure slightly elevated at 130/80 mmHg today. Generally well-controlled at home. Continue current blood pressure regimen Hydrochlorothiazide  50, amlodipine  10, losartan  100 Consider tapering hydrochlorothiazide  with clinic pharmacist Will follow-up  Hyperlipidemia Chronic Continue pravastatin  40 Mg Continue lifestyle modifications We will follow-up  Type 2 diabetes mellitus with other specified complication, without long-term current use of insulin (HCC) (Primary)  - Comprehensive metabolic panel with GFR - Lipid Panel With LDL/HDL Ratio - Hemoglobin A1c - AMB Referral VBCI Care Management  Hypertension associated with diabetes (HCC)  - Comprehensive metabolic panel with GFR - Lipid Panel With LDL/HDL Ratio - Hemoglobin A1c  Hyperlipidemia associated with type 2 diabetes mellitus (HCC)  - Comprehensive metabolic panel with GFR - Lipid Panel With LDL/HDL Ratio - Hemoglobin A1c   No orders of the defined types were placed in this encounter.   No follow-ups on file.   The patient was advised to call back or seek an in-person evaluation if the symptoms worsen or if the condition fails to improve as anticipated.  I discussed the assessment and treatment plan with the patient. The patient was provided an opportunity to ask questions and all were answered. The patient agreed with the plan and demonstrated an understanding of the instructions.  I, Brigette Hopfer, PA-C have reviewed all documentation for this visit. The  documentation on 05/30/2024  for the exam, diagnosis, procedures, and orders  are all accurate and complete.  Jolynn Spencer, [facility], MMS [facility] [phone] (phone) [phone] (fax)  [facility]

## 2024-05-31 LAB — COMPREHENSIVE METABOLIC PANEL WITH GFR
ALT: 44 IU/L — ABNORMAL HIGH (ref 0–32)
AST: 24 IU/L (ref 0–40)
Albumin: 4.4 g/dL (ref 3.9–4.9)
Alkaline Phosphatase: 85 IU/L (ref 44–121)
BUN/Creatinine Ratio: 21 (ref 12–28)
BUN: 15 mg/dL (ref 8–27)
Bilirubin Total: 0.6 mg/dL (ref 0.0–1.2)
CO2: 20 mmol/L (ref 20–29)
Calcium: 11.1 mg/dL — ABNORMAL HIGH (ref 8.7–10.3)
Chloride: 100 mmol/L (ref 96–106)
Creatinine, Ser: 0.71 mg/dL (ref 0.57–1.00)
Globulin, Total: 2.7 g/dL (ref 1.5–4.5)
Glucose: 164 mg/dL — ABNORMAL HIGH (ref 70–99)
Potassium: 4.1 mmol/L (ref 3.5–5.2)
Sodium: 139 mmol/L (ref 134–144)
Total Protein: 7.1 g/dL (ref 6.0–8.5)
eGFR: 93 mL/min/1.73 (ref >59)

## 2024-05-31 LAB — LIPID PANEL WITH LDL/HDL RATIO
Cholesterol, Total: 138 mg/dL (ref 100–199)
HDL: 65 mg/dL (ref >39)
LDL Chol Calc (NIH): 51 mg/dL (ref 0–99)
LDL/HDL Ratio: 0.8 ratio (ref 0.0–3.2)
Triglycerides: 126 mg/dL (ref 0–149)
VLDL Cholesterol Cal: 22 mg/dL (ref 5–40)

## 2024-05-31 LAB — HEMOGLOBIN A1C
Est. average glucose Bld gHb Est-mCnc: 229 mg/dL
Hgb A1c MFr Bld: 9.6 % — ABNORMAL HIGH (ref 4.8–5.6)

## 2024-06-02 ENCOUNTER — Ambulatory Visit: Payer: Self-pay | Admitting: Physician Assistant

## 2024-06-02 NOTE — Progress Notes (Signed)
 Please, check with [facility] if we could add PTH test fro abnormal calcium

## 2024-06-03 ENCOUNTER — Telehealth: Payer: Self-pay

## 2024-06-03 NOTE — Progress Notes (Signed)
 Care Guide Pharmacy Note  06/03/2024 Name: Gayna  LIADAN GUIZAR MRN: [id number] DOB: 1956/06/07  Referred By: Ostwalt, Janna, PA-C Reason for referral: Complex Care Management and Call Attempt #1 (Unsuccessful initial outreach to schedule with PHARM D- Allyson)   Leylani  A Linarez is a 68 y.o. year old female who is a primary care patient of Dineen, Janna, PA-C.  Krupa  A Culpepper was referred to the pharmacist for assistance related to: DMII  An unsuccessful telephone outreach was attempted today to contact the patient who was referred to the pharmacy team for assistance with medication assistance. Additional attempts will be made to contact the patient.  Leotis Rase [facility], [facility] Guide  Direct Dial: [phone]  Fax [phone]

## 2024-06-06 NOTE — Progress Notes (Unsigned)
 Care Guide Pharmacy Note  06/06/2024 Name: Jodi Becker MRN: [id number] DOB: [birth year]  Referred By: Ostwalt, Janna, PA-C Reason for referral: Complex Care Management, Call Attempt #1 (Unsuccessful initial outreach to schedule with PHARM D- Allyson), and Call Attempt #2 (Unsuccessful initial outreach to schedule with PHARM D- Allyson)   Jodi Becker is a 68 y.o. year old female who is a primary care patient of Dineen, Janna, PA-C.  Jodi Becker was referred to the pharmacist for assistance related to: DMII  A second unsuccessful telephone outreach was attempted today to contact the patient who was referred to the pharmacy team for assistance with medication assistance. Additional attempts will be made to contact the patient.  Leotis Rase [facility], [facility] Guide  Direct Dial: [phone]  Fax [phone]

## 2024-06-07 NOTE — Progress Notes (Signed)
 Care Guide Pharmacy Note  06/07/2024 Name: Dessa  TAKEIA CIARAVINO MRN: [id number] DOB: [birth year]  Referred By: Ostwalt, Janna, PA-C Reason for referral: Complex Care Management, Call Attempt #1 (Unsuccessful initial outreach to schedule with PHARM D- Allyson), Call Attempt #2 (Unsuccessful initial outreach to schedule with PHARM D- Allyson), and Call Attempt #3 (Successful initial outreach scheduled with PHARM D- Allyson)   Samarrah  A Poudrier is a 68 y.o. year old female who is a primary care patient of Dineen, Janna, PA-C.  Anderia  A Rachels was referred to the pharmacist for assistance related to: DMII  Successful contact was made with the patient to discuss pharmacy services including being ready for the pharmacist to call at least 5 minutes before the scheduled appointment time and to have medication bottles and any blood pressure readings ready for review. The patient agreed to meet with the pharmacist via In Office visit on 06/16/24 @ 10 AM. on (date/time).  Leotis Rase [facility], [facility] Guide  Direct Dial: [phone]  Fax [phone]

## 2024-06-16 ENCOUNTER — Encounter: Payer: Self-pay | Admitting: Physician Assistant

## 2024-06-16 ENCOUNTER — Other Ambulatory Visit: Payer: Self-pay | Admitting: Physician Assistant

## 2024-06-16 ENCOUNTER — Telehealth: Payer: Self-pay

## 2024-06-16 ENCOUNTER — Ambulatory Visit

## 2024-06-16 ENCOUNTER — Other Ambulatory Visit: Payer: Self-pay

## 2024-06-16 VITALS — BP 109/71 | HR 69

## 2024-06-16 DIAGNOSIS — Z7985 Long-term (current) use of injectable non-insulin antidiabetic drugs: Secondary | ICD-10-CM | POA: Diagnosis not present

## 2024-06-16 DIAGNOSIS — I152 Hypertension secondary to endocrine disorders: Secondary | ICD-10-CM | POA: Diagnosis not present

## 2024-06-16 DIAGNOSIS — E1159 Type 2 diabetes mellitus with other circulatory complications: Secondary | ICD-10-CM | POA: Diagnosis not present

## 2024-06-16 DIAGNOSIS — E1169 Type 2 diabetes mellitus with other specified complication: Secondary | ICD-10-CM

## 2024-06-16 DIAGNOSIS — Z1231 Encounter for screening mammogram for malignant neoplasm of breast: Secondary | ICD-10-CM

## 2024-06-16 DIAGNOSIS — Z7984 Long term (current) use of oral hypoglycemic drugs: Secondary | ICD-10-CM

## 2024-06-16 MED ORDER — HYDROCHLOROTHIAZIDE 25 MG PO TABS: 25.0000 mg | ORAL_TABLET | Freq: Every day | ORAL | 3 refills | Status: AC

## 2024-06-16 MED ORDER — TRULICITY 0.75 MG/0.5ML ~~LOC~~ SOAJ: 0.7500 mg | SUBCUTANEOUS | Status: DC

## 2024-06-16 NOTE — Telephone Encounter (Signed)
 Mail out pt portion of [facility] [employer] Trulicity  to pt homes address and faxed provider portion to be sign and date today.

## 2024-06-16 NOTE — Patient Instructions (Signed)
 Thanks for seeing me today!  Please decrease your hydrochlorothiazide  to 25 mg daily. You can split your 50 mg tablet in half until you pick up the new prescription Increase walking to 2 times per week

## 2024-06-16 NOTE — Progress Notes (Signed)
 S:     Chief Complaint  Patient presents with   Medication Management    Hypertension & Diabetes    Reason for visit: ?  Jodi Becker is a 68 y.o. female with a history of diabetes (type 2), who presents today for a follow up diabetes pharmacotherapy visit.? Pertinent PMH also includes HTN, HLD, and obesity.  Care Team: Primary Care Provider: Ostwalt, Janna, PA-C  At last visit with PCP on 05/30/24, patient reported a fasting BG reading of 172 mg/dL. Glipizide  was increased to 20 mg daily. A1c was elevated at 9.6% at that time.   Current diabetes medications include: metformin  500 mg 2 tablets BID, Farxiga  10 mg daily Previous diabetes medications include: Trulicity , Tradjenta , Januvia, Ozempic  (GI upset), Rybelsus , pioglitazone  Current hypertension medications include: amlodipine  10 mg daily, losartan  100 mg daily, hydrochlorothiazide  50 mg daily  Current hyperlipidemia medications include: pravastatin  40 mg daily  Patient reports adherence to taking all medications as prescribed.   Have you been experiencing any side effects to the medications prescribed? no Do you have any problems obtaining medications due to transportation or finances? yes Insurance coverage: [employer]  Patient denies any GI upset with Trulicity  when used previously.   Patient denies hypoglycemic events.  Reported home fasting blood sugars: 170-190 mg/dL   Patient reports nocturia (nighttime urination).  Patient denies visual changes.  Patient reported dietary habits: Eats 2 meals/day Breakfast: oatmeal, eggs, bacon, sausage biscuit Lunch: meat and salad, hotdog, cheeseburger Dinner: vegetable and protein Snacks: chips, fruit Drinks: coffee, sugar-free tea  Patient-reported exercise habits: no formal exercise reported  Hypertension: Patient has a validated, automated, upper arm home BP cuff Current blood pressure readings readings: 130s systolic  This AM: 129/69  mmHg  Patient denies hypotensive s/sx including dizziness, lightheadedness.  Patient denies hypertensive symptoms including chest pain, shortness of breath   DM Prevention:  Statin: Taking; moderate intensity.?  History of albuminuria? no, last UACR on 11/05/23 = 6 mg/g Last eye exam: 10/2023 Lab Results  Component Value Date   HMDIABEYEEXA No Retinopathy 09/22/2019   Tobacco Use:   Tobacco Use: Low Risk  (05/30/2024)   Patient History    Smoking Tobacco Use: Never    Smokeless Tobacco Use: Never    Passive Exposure: Not on file   O:   Vitals:  Wt Readings from Last 3 Encounters:  05/30/24 259 lb 14.4 oz (117.9 kg)  04/14/24 258 lb (117 kg)  02/05/24 261 lb (118.4 kg)   BP Readings from Last 3 Encounters:  06/16/24 109/71  05/30/24 130/78  04/14/24 129/73   Pulse Readings from Last 3 Encounters:  06/16/24 69  05/30/24 72  04/14/24 71     Labs:?  Lab Results  Component Value Date   HGBA1C 9.6 (H) 05/30/2024   HGBA1C 9.3 (H) 02/05/2024   HGBA1C 8.4 (H) 11/05/2023   GLUCOSE 164 (H) 05/30/2024   MICRALBCREAT 6 11/05/2023   MICRALBCREAT 5 10/24/2022   MICRALBCREAT <4 10/22/2021   CREATININE 0.71 05/30/2024   CREATININE 0.84 02/05/2024   CREATININE 0.73 11/05/2023    Lab Results  Component Value Date   CHOL 138 05/30/2024   LDLCALC 51 05/30/2024   LDLCALC 56 02/05/2024   LDLCALC 58 11/05/2023   HDL 65 05/30/2024   TRIG 126 05/30/2024   TRIG 103 02/05/2024   TRIG 123 11/05/2023   ALT 44 (H) 05/30/2024   ALT 34 (H) 02/05/2024   AST 24 05/30/2024   AST 20 02/05/2024  Chemistry      Component Value Date/Time   NA 139 05/30/2024 0904   K 4.1 05/30/2024 0904   CL 100 05/30/2024 0904   CO2 20 05/30/2024 0904   BUN 15 05/30/2024 0904   CREATININE 0.71 05/30/2024 0904   CREATININE 0.73 11/05/2023 0914      Component Value Date/Time   CALCIUM 11.1 (H) 05/30/2024 0904   ALKPHOS 85 05/30/2024 0904   AST 24 05/30/2024 0904   ALT 44 (H) 05/30/2024  0904   BILITOT 0.6 05/30/2024 0904       The 10-year ASCVD risk score (Arnett DK, et al., 2019) is: 12.7%  Lab Results  Component Value Date   MICRALBCREAT 6 11/05/2023   MICRALBCREAT 5 10/24/2022   MICRALBCREAT <4 10/22/2021    A/P: Diabetes currently uncontrolled with a most recent A1c of 9.6% on 05/30/24, which is up from 9.3% on 02/05/24. Medication adherence appears appropriate. Patient was receiving Ozempic  via PAP, however, she experienced significant GI distress with this. Historically managed on Trulicity  without ADE, but this was switched to Ozempic  in order to enroll in PAP. Will enroll in PAP for Trulicity  and cautiously titrate given history of GI distress with Ozempic .  -Restarted GLP-1 Trulicity  (dulaglutide ) 0.75 mg weekly. Will contact CPhT, Ana, to help enroll in PAP.  -Continued SGLT2-I Farxiga  (dapagliflozin ) 10 mg daily  -Continued glipizide  10 mg BID -Continued metformin  500 mg 2 tablets BID.  -Patient educated on purpose, proper use, and potential adverse effects of Trulicity .  -Extensively discussed pathophysiology of diabetes, recommended lifestyle interventions, dietary effects on blood sugar control.  -Counseled on s/sx of and management of hypoglycemia.  -Next A1c anticipated 08/2024.  -Patient goal to increase walking to 2 days per week  ASCVD risk - primary prevention in patient with diabetes. Last LDL is 51 mg/dL, at goal of <29 mg/dL.  -Continued pravastatin  40 mg daily.   Hypertension longstanding currently controlled on therapies. Home blood pressure readings at goal with systolic of 130 mmHg, however patient reports these readings are prior to taking medications. Will decrease thiazide diuretic dose at this time given controlled readings prior to medications.   -Decreased dose of hydrochlorothiazide  to 25 mg daily. -Continued amlodipine  10 mg daily -Continued losartan  100 mg daily -Counseled to check BP ~1 hour after medications and present with log  at follow up appointment  Written patient instructions provided. Patient verbalized understanding of treatment plan.  Total time in face to face counseling 60 minutes.     Follow-up:  Pharmacist on 07/20/24 PCP clinic visit on 08/30/24  [name] [name], PharmD Clinical Pharmacist [facility] [phone]

## 2024-06-17 ENCOUNTER — Other Ambulatory Visit: Payer: Self-pay | Admitting: Physician Assistant

## 2024-06-17 DIAGNOSIS — E7800 Pure hypercholesterolemia, unspecified: Secondary | ICD-10-CM

## 2024-06-21 NOTE — Telephone Encounter (Signed)
 Received provider potion today,waiting on pt portion to mail back to submitted to [facility].

## 2024-06-22 ENCOUNTER — Ambulatory Visit

## 2024-06-22 DIAGNOSIS — Z0000 Encounter for general adult medical examination without abnormal findings: Secondary | ICD-10-CM

## 2024-06-22 DIAGNOSIS — Z1211 Encounter for screening for malignant neoplasm of colon: Secondary | ICD-10-CM
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
## 2024-06-22 NOTE — Patient Instructions (Addendum)
 Ms. Rowen,  Thank you for taking the time for your Medicare Wellness Visit. I appreciate your continued commitment to your health goals. Please review the care plan we discussed, and feel free to reach out if I can assist you further.  Medicare recommends these wellness visits once per year to help you and your care team stay ahead of potential health issues. These visits are designed to focus on prevention, allowing your provider to concentrate on managing your acute and chronic conditions during your regular appointments.  Please note that Annual Wellness Visits do not include a physical exam. Some assessments may be limited, especially if the visit was conducted virtually. If needed, we may recommend a separate in-person follow-up with your provider.  Ongoing Care Seeing your primary care provider every 3 to 6 months helps us  monitor your health and provide consistent, personalized care.   Referrals If a referral was made during today's visit and you haven't received any updates within two weeks, please contact the referred provider directly to check on the status.  Recommended Screenings:  Health Maintenance  Topic Date Due   Eye exam for diabetics  09/21/2020   Complete foot exam   04/27/2024   Flu Shot  05/13/2024   COVID-19 Vaccine (5 - 2025-26 season) 06/13/2024   Colon Cancer Screening  08/06/2024   Mammogram  07/01/2024   Yearly kidney health urinalysis for diabetes  11/04/2024   Hemoglobin A1C  11/30/2024   Yearly kidney function blood test for diabetes  05/30/2025   Medicare Annual Wellness Visit  06/22/2025   DEXA scan (bone density measurement)  07/01/2027   DTaP/Tdap/Td vaccine (2 - Td or Tdap) 10/23/2031   Pneumococcal Vaccine for age over 44  Completed   Hepatitis C Screening  Completed   Zoster (Shingles) Vaccine  Completed   HPV Vaccine  Aged Out   Meningitis B Vaccine  Aged Out     Advance Care Planning is important because it: Ensures you receive medical  care that aligns with your values, goals, and preferences. Provides guidance to your family and loved ones, reducing the emotional burden of decision-making during critical moments.  Vision: Annual vision screenings are recommended for early detection of glaucoma, cataracts, and diabetic retinopathy. These exams can also reveal signs of chronic conditions such as diabetes and high blood pressure.  Dental: Annual dental screenings help detect early signs of oral cancer, gum disease, and other conditions linked to overall health, including heart disease and diabetes.  Please see the attached documents for additional preventive care recommendations.   NEXT AWV 06/27/25 @ 3:50 PM BY PHONE

## 2024-07-01 NOTE — Telephone Encounter (Signed)
 Received a call from pt,pt had a few question on her application,question's were answer pt will mailback application today,

## 2024-07-05 ENCOUNTER — Telehealth: Payer: Self-pay

## 2024-07-05 NOTE — Telephone Encounter (Signed)
 Received a reorder form from [employer] requesting a refill on (Ozempic ) fill and faxed to provider office to sign and date can be fax to [employer] or fax back to [phone].

## 2024-07-07 NOTE — Telephone Encounter (Signed)
 Received pt portion of PAP [employer] (Trulicity ),faxed it to [employer] along provider portion,proof of income,pharmacy out of pocket spend, Ins. Card today will follo wup in a few days.

## 2024-07-07 NOTE — Telephone Encounter (Signed)
 Received provider portion of [employer] refill reorder form from provider office, faxed to [employer] today.

## 2024-07-08 ENCOUNTER — Telehealth: Payer: Self-pay

## 2024-07-08 DIAGNOSIS — E1169 Type 2 diabetes mellitus with other specified complication: Secondary | ICD-10-CM

## 2024-07-08 MED ORDER — TRULICITY 0.75 MG/0.5ML ~~LOC~~ SOAJ: 0.7500 mg | SUBCUTANEOUS | 0 refills | Status: DC

## 2024-07-08 NOTE — Telephone Encounter (Signed)
 Received a Dealer requesting medical exception request form needs to be completed by provider,I have send a msg to [facility] to help with it.

## 2024-07-08 NOTE — Progress Notes (Signed)
 Trulicity  patient assistance requires for patient to be on therapy within the past 12 months. Patient was last on Trulicity  in 09/2022 until cost was no longer feasible at the pharmacy.   Patient will require Trulicity  prior authorization. Submitted prior authorization to covermymeds on 07/08/24. Received PA approval.  Per test claim, medication costs $0 at the pharmacy. Will discontinue PAP process at this time.  Left voicemail with patient to discuss. Will try again at a later time.   Annaliza Zia E. Marsh, PharmD Clinical Pharmacist [facility] [phone]

## 2024-07-08 NOTE — Progress Notes (Signed)
 Brief Telephone Documentation Reason for Call: Trulicity  copay  Summary of Call: Discussed $0 copay for Trulicity . Patient vocalized understanding.   Follow Up: Patient given direct line for further questions/concerns.  Md Smola E. Marsh, PharmD Clinical Pharmacist [facility] [phone]

## 2024-07-08 NOTE — Telephone Encounter (Signed)
 Per [facility] pt has been approved for PA on Trulicity  today  and cancel Ozempic  due to pt can not be on both meds.

## 2024-07-14 ENCOUNTER — Ambulatory Visit: Payer: Self-pay

## 2024-07-14 ENCOUNTER — Telehealth: Payer: Self-pay

## 2024-07-14 NOTE — Telephone Encounter (Signed)
 Brief Telephone Documentation Reason for Call: Patient left message regarding question for pharmacist regarding blood pressure   Summary of Call: Patient reported going to the dentist today and was explained a high cost for her procedure that was unexpected. Patient subsequently had elevated BP on first check, that decreased upon recheck. Patient was unable to confirm any of these readings.  Upon returning home, she began to check her BP multiple times with readings ~140/80 mmHg. Patient reports home BP readings have been 130/80s for the past month.  Encouraged patient to continue monitoring BP, but only once daily after medications to avoid any additional unnecessary stress from excessive monitoring.   Patient denied CP, HA, SOB, blurry vision, or dizziness. Encouraged patient to present to ED if symptoms develop.   Follow Up: 07/20/24 with clinical pharmacist  [name] Jodi Becker, PharmD Clinical Pharmacist [facility] [phone]

## 2024-07-14 NOTE — Telephone Encounter (Signed)
 FYI Only or Action Required?: FYI only for provider.  Patient was last seen in primary care on 05/30/2024 by Ostwalt, Janna, PA-C.  Called Nurse Triage reporting Hypertension.  Symptoms began today.  Interventions attempted: Rest, hydration, or home remedies.  Symptoms are: gradually improving.  Triage Disposition: See PCP Within 2 Weeks  Patient/caregiver understands and will follow disposition?: No    Copied from CRM [id number]. Topic: Clinical - Medical Advice >> Jul 14, 2024 12:05 PM Yolanda T wrote: Reason for CRM: patient stated she was taken off the hydrochlorothiazide  50mg  and dosage changed to 25mg . she has contacted the pharmacist with no answer. She says she went to the dentist got upset and he pressure went up but she does not know the reading. Patient said her pressure a few minutes ago was 147-87. Patient says she has been having a slight headache. Please f/u with patient Reason for Disposition  [1] Systolic BP >= 130 OR Diastolic >= 80 AND [2] taking BP medications  Answer Assessment - Initial Assessment Questions Additional info: Dental office earlier, she was told a high cost for visit which upset her, her blood pressure elevated she is unsure what the reading was, she rechecked and it is 147/87 She declined acute visit, she plans to monitor her blood pressure and will call back if not returning to baseline, headache not resolving or if she develops new symptoms.    1. BLOOD PRESSURE: What is your blood pressure? Did you take at least two measurements 5 minutes apart?     High at dental office earlier, now it is 147/87  2. ONSET: When did you take your blood pressure?     Few minutes ago 3. HOW: How did you take your blood pressure? (e.g., automatic home BP monitor, visiting nurse)     Home cuff 4. HISTORY: Do you have a history of high blood pressure?     yes 5. MEDICINES: Are you taking any medicines for blood pressure? Have you missed any doses  recently?     No missed doses  6. OTHER SYMPTOMS: Do you have any symptoms? (e.g., blurred vision, chest pain, difficulty breathing, headache, weakness)     Slight headache  Protocols used: Blood Pressure - High-A-AH

## 2024-07-15 NOTE — Telephone Encounter (Signed)
 Called pt but pt disconnected the call. Was unable to advise on provider recommendation

## 2024-07-20 ENCOUNTER — Telehealth: Payer: Self-pay

## 2024-07-20 ENCOUNTER — Ambulatory Visit

## 2024-07-20 VITALS — BP 126/73 | HR 63

## 2024-07-20 DIAGNOSIS — I152 Hypertension secondary to endocrine disorders: Secondary | ICD-10-CM

## 2024-07-20 DIAGNOSIS — E1169 Type 2 diabetes mellitus with other specified complication: Secondary | ICD-10-CM

## 2024-07-20 DIAGNOSIS — E1159 Type 2 diabetes mellitus with other circulatory complications: Secondary | ICD-10-CM

## 2024-07-20 DIAGNOSIS — Z7984 Long term (current) use of oral hypoglycemic drugs: Secondary | ICD-10-CM | POA: Diagnosis not present

## 2024-07-20 DIAGNOSIS — Z7985 Long-term (current) use of injectable non-insulin antidiabetic drugs: Secondary | ICD-10-CM | POA: Diagnosis not present

## 2024-07-20 MED ORDER — TRULICITY 1.5 MG/0.5ML ~~LOC~~ SOAJ: 1.5000 mg | SUBCUTANEOUS | 0 refills | Status: DC

## 2024-07-20 NOTE — Progress Notes (Signed)
 S:     Chief Complaint  Patient presents with   Medication Management    Diabetes and Hypertension    Reason for visit: ?  Jodi  A Becker is a 68 y.o. female with a history of diabetes (type 2), who presents today for a follow up diabetes pharmacotherapy visit.? Pertinent PMH also includes HTN, HLD, and obesity.  Care Team: Primary Care Provider: Ostwalt, Janna, PA-C  At last visit with PCP on 05/30/24, patient reported a fasting BG reading of 172 mg/dL. Glipizide  was increased to 20 mg daily. A1c was elevated at 9.6% at that time.   At last visit with clinical pharmacist on 06/16/24, patient was restarted on Trulicity  0.75 mg weekly. Additionally, reported home BP readings were around 130s systolic BEFORE medications. Decreased hydrochlorothiazide  from 50 to 25 mg daily at that time.   Current diabetes medications include: metformin  500 mg 2 tablets BID, Farxiga  10 mg daily, Trulicity  0.75 mg weekly (Saturdays), glipizide  XL 5 mg 1 tablet BID Previous diabetes medications include: Trulicity , Tradjenta , Januvia, Ozempic  (GI upset), Rybelsus , pioglitazone  Current hypertension medications include: amlodipine  10 mg daily, losartan  100 mg daily, hydrochlorothiazide  25 mg daily  Current hyperlipidemia medications include: pravastatin  40 mg daily  Patient reports adherence to taking all medications as prescribed. Uses a weekly pill box.   Have you been experiencing any side effects to the medications prescribed? Yes - constipation from Trulicity  Do you have any problems obtaining medications due to transportation or finances? yes Insurance coverage: [employer]  Patient denies hypoglycemic events.  Reported home fasting blood sugars: 126-204 mg/dL  Patient denies nocturia (nighttime urination).  Patient denies visual changes.   Patient reported dietary habits: Eats 2 meals/day  Breakfast: oatmeal, eggs, bacon, sausage biscuit Lunch: meat and salad, hotdog,  cheeseburger Dinner: vegetable and protein Snacks: chips, fruit Drinks: coffee, sugar-free tea  Patient-reported exercise habits: walking 2 times per week  Hypertension: Patient has a validated, automated, upper arm home BP cuff Current blood pressure readings readings: average 128/76  Patient denies  hypotensive s/sx including dizziness, lightheadedness.  Patient denies hypertensive symptoms including chest pain, shortness of breath    DM Prevention:  Statin: Taking; moderate intensity.?  History of albuminuria? no, last UACR on 11/05/23 = 6 mg/g Last eye exam: 10/2023 Lab Results  Component Value Date   HMDIABEYEEXA No Retinopathy 09/22/2019   Tobacco Use:   Tobacco Use: Low Risk  (06/22/2024)   Patient History    Smoking Tobacco Use: Never    Smokeless Tobacco Use: Never    Passive Exposure: Not on file   O:   Vitals:  Wt Readings from Last 3 Encounters:  05/30/24 259 lb 14.4 oz (117.9 kg)  04/14/24 258 lb (117 kg)  02/05/24 261 lb (118.4 kg)   BP Readings from Last 3 Encounters:  07/20/24 126/73  06/16/24 109/71  05/30/24 130/78   Pulse Readings from Last 3 Encounters:  07/20/24 63  06/16/24 69  05/30/24 72     Labs:?  Lab Results  Component Value Date   HGBA1C 9.6 (H) 05/30/2024   HGBA1C 9.3 (H) 02/05/2024   HGBA1C 8.4 (H) 11/05/2023   GLUCOSE 164 (H) 05/30/2024   MICRALBCREAT 6 11/05/2023   MICRALBCREAT 5 10/24/2022   MICRALBCREAT <4 10/22/2021   CREATININE 0.71 05/30/2024   CREATININE 0.84 02/05/2024   CREATININE 0.73 11/05/2023    Lab Results  Component Value Date   CHOL 138 05/30/2024   LDLCALC 51 05/30/2024   LDLCALC 56 02/05/2024  LDLCALC 58 11/05/2023   HDL 65 05/30/2024   TRIG 126 05/30/2024   TRIG 103 02/05/2024   TRIG 123 11/05/2023   ALT 44 (H) 05/30/2024   ALT 34 (H) 02/05/2024   AST 24 05/30/2024   AST 20 02/05/2024      Chemistry      Component Value Date/Time   NA 139 05/30/2024 0904   K 4.1 05/30/2024 0904   CL  100 05/30/2024 0904   CO2 20 05/30/2024 0904   BUN 15 05/30/2024 0904   CREATININE 0.71 05/30/2024 0904   CREATININE 0.73 11/05/2023 0914      Component Value Date/Time   CALCIUM 11.1 (H) 05/30/2024 0904   ALKPHOS 85 05/30/2024 0904   AST 24 05/30/2024 0904   ALT 44 (H) 05/30/2024 0904   BILITOT 0.6 05/30/2024 0904       The 10-year ASCVD risk score (Arnett DK, et al., 2019) is: 16.9%  Lab Results  Component Value Date   MICRALBCREAT 6 11/05/2023   MICRALBCREAT 5 10/24/2022   MICRALBCREAT <4 10/22/2021    A/P: Diabetes currently uncontrolled with a most recent A1c of 9.6% on 05/30/24, which is up from 9.3% on 02/05/24. Medication adherence appears appropriate. Patient reports some increased constipation with Trulicity  that is manageable. BG readings have improved since last visit with a fasting range of 126-204 mg/dL since starting Trulicity . Patient has 2 additional doses left of Trulicity . Will increase dose at next fill. May discuss discontinuation of glipizide  at follow up.  -Increased dose of GLP-1 Trulicity  (dulaglutide ) to 1.5 mg weekly starting 07/30/24 -Continued SGLT2-I Farxiga  (dapagliflozin ) 10 mg daily  -Continued glipizide  10 mg BID -Continued metformin  500 mg 2 tablets BID.  -Patient educated on purpose, proper use, and potential adverse effects of Trulicity .  -Extensively discussed pathophysiology of diabetes, recommended lifestyle interventions, dietary effects on blood sugar control.  -Counseled on s/sx of and management of hypoglycemia.  -Next A1c anticipated 08/2024.   ASCVD risk - primary prevention in patient with diabetes. Last LDL is 51 mg/dL, at goal of <29 mg/dL.  -Continued pravastatin  40 mg daily.   Hypertension longstanding currently controlled on therapies. Home blood pressure readings at goal with an average of 128/76 mmHg, however patient continues to monitor BP readings BEFORE medications. Concern for possible hypotension, however patient denies  any s/sx of hypotension. Will continue current regimen at this time.  -Continued hydrochlorothiazide  25 mg daily. -Continued amlodipine  10 mg daily -Continued losartan  100 mg daily -Counseled to check BP ~1 hour after medications and present with log at follow up appointment  Written patient instructions provided. Patient verbalized understanding of treatment plan.  Total time in face to face counseling 30 minutes.     Follow-up:  Pharmacist on 09/01/24 PCP clinic visit on 09/01/24  [name] [name], PharmD Clinical Pharmacist [facility] [phone]

## 2024-07-20 NOTE — Telephone Encounter (Signed)
 Gave pt a call to let her know she will be receiving PAP Farxiga  in the mail,pt is coming up due for reenrollment and will faxed provider portion.

## 2024-07-20 NOTE — Patient Instructions (Signed)
 Please begin monitoring your blood pressure at least one hour after your medications.  Please continue monitoring you blood sugar before breakfast.

## 2024-07-22 NOTE — Telephone Encounter (Signed)
 Received provider portion back on [employer] Farxiga.

## 2024-07-29 ENCOUNTER — Other Ambulatory Visit: Payer: Self-pay | Admitting: Physician Assistant

## 2024-07-29 DIAGNOSIS — E1165 Type 2 diabetes mellitus with hyperglycemia: Secondary | ICD-10-CM

## 2024-08-03 ENCOUNTER — Other Ambulatory Visit (HOSPITAL_COMMUNITY): Payer: Self-pay

## 2024-08-03 NOTE — Telephone Encounter (Signed)
 Received pt portion [employer] Farxiga  faxed to [employer] along provider portion today.

## 2024-08-05 ENCOUNTER — Telehealth: Payer: Self-pay

## 2024-08-05 DIAGNOSIS — E1169 Type 2 diabetes mellitus with other specified complication: Secondary | ICD-10-CM

## 2024-08-05 MED ORDER — DAPAGLIFLOZIN PROPANEDIOL 10 MG PO TABS: 10.0000 mg | ORAL_TABLET | Freq: Every day | ORAL | 3 refills | Status: AC

## 2024-08-05 NOTE — Telephone Encounter (Signed)
 Received approval letter [employer] Farxiga  thru 10/12/2025 approval letter index.

## 2024-08-05 NOTE — Telephone Encounter (Signed)
 Received a refill request on [employer] Farxiga  can be fax to [facility] AT [phone]

## 2024-08-05 NOTE — Addendum Note (Signed)
 Addended by: MARSH, Tome Wilson E on: 08/05/2024 03:40 PM   Modules accepted: Orders

## 2024-08-09 ENCOUNTER — Ambulatory Visit
Admission: RE | Admit: 2024-08-09 | Discharge: 2024-08-09 | Disposition: A | Discharge status: Home / Self Care | Source: Ambulatory Visit | Attending: Physician Assistant | Admitting: Physician Assistant

## 2024-08-09 DIAGNOSIS — Z1231 Encounter for screening mammogram for malignant neoplasm of breast: Secondary | ICD-10-CM | POA: Diagnosis not present

## 2024-08-10 NOTE — Progress Notes (Signed)
 Jodi Becker  Jodi Becker                                          MRN: [id number]   08/10/2024   The [facility] Quality Team Specialist reviewed this patient medical record for the purposes of chart review for care gap closure. The following were reviewed: chart review for care gap closure-glycemic status assessment.    [facility]BCI Quality Team

## 2024-08-11 ENCOUNTER — Ambulatory Visit: Payer: Self-pay | Admitting: Physician Assistant

## 2024-08-14 ENCOUNTER — Other Ambulatory Visit: Payer: Self-pay | Admitting: Physician Assistant

## 2024-08-14 DIAGNOSIS — E1169 Type 2 diabetes mellitus with other specified complication: Secondary | ICD-10-CM

## 2024-08-17 ENCOUNTER — Other Ambulatory Visit: Payer: Self-pay

## 2024-08-17 ENCOUNTER — Telehealth: Payer: Self-pay

## 2024-08-17 DIAGNOSIS — Z860100 Personal history of colon polyps, unspecified: Secondary | ICD-10-CM

## 2024-08-17 MED ORDER — GOLYTELY 236 G PO SOLR: 4000.0000 mL | Freq: Once | ORAL | 0 refills | Status: AC

## 2024-08-17 NOTE — Telephone Encounter (Signed)
 Gastroenterology Pre-Procedure Review  Request Date: 11/28/24 Requesting Physician: Dr. Dr. Jinny  PATIENT REVIEW QUESTIONS: The patient responded to the following health history questions as indicated:    1. Are you having any GI issues? no 2. Do you have a personal history of Polyps? yes (last procedure perfomed by Dr.  Therisa recommended repeat in 3 years. Personal history of colon cancer in 2016 ) 3. Do you have a family history of Colon Cancer or Polyps? no 4. Diabetes Mellitus? yes (Stop dates noted and verbally advised as follows (Trulicity  7 days, Farxiga  3, Metformin  2 days, Glipizide  1) 5. Joint replacements in the past 12 months?no 6. Major health problems in the past 3 months?no 7. Any artificial heart valves, MVP, or defibrillator?no    MEDICATIONS & ALLERGIES:    Patient reports the following regarding taking any anticoagulation/antiplatelet therapy:   Plavix, Coumadin, Eliquis, Xarelto, Lovenox, Pradaxa, Brilinta, or Effient? no Aspirin? yes (81 mg daily)  Patient confirms/reports the following medications:  Current Outpatient Medications  Medication Sig Dispense Refill   amLODipine  (NORVASC ) 10 MG tablet Take 1 tablet (10 mg total) by mouth daily. TAKE 1 TABLET BY MOUTH ONCE DAILY. 90 tablet 1   aspirin 81 MG chewable tablet Chew by mouth.     b complex vitamins capsule Take 1 capsule by mouth daily.     Biotin 5000 MCG TABS Take 2 tablets by mouth.     Cholecalciferol (VITAMIN D3) 1000 UNITS CAPS Take 2,000 Units by mouth daily.     dapagliflozin  propanediol (FARXIGA ) 10 MG TABS tablet Take 1 tablet (10 mg total) by mouth daily. 90 tablet 3   glipiZIDE  (GLUCOTROL  XL) 10 MG 24 hr tablet TAKE 2 TABLETS BY MOUTH ONCE DAILY WITH BREAKFAST 180 tablet 1   Glucose Blood (BLOOD GLUCOSE TEST STRIPS 333) STRP To test daily and as needed for abnormal reading 100 strip 2   hydrochlorothiazide  (HYDRODIURIL ) 25 MG tablet Take 1 tablet (25 mg total) by mouth daily. 90 tablet 3    Lancets (ONETOUCH DELICA PLUS LANCET33G) MISC USE TO CHECK BLOOD SUGAR UP TO 4 TIMES DAILY 100 each 0   loratadine  (CLARITIN ) 10 MG tablet Take 1 tablet (10 mg total) by mouth daily. Take 1 tablet by mouth once daily 90 tablet 1   losartan  (COZAAR ) 100 MG tablet Take 1 tablet (100 mg total) by mouth daily. TAKE 1 TABLET BY MOUTH ONCE DAILY . 90 tablet 1   Magnesium 100 MG TABS Take 100 mg by mouth daily.     metFORMIN  (GLUCOPHAGE ) 500 MG tablet Take 2 tablets by mouth twice daily 360 tablet 2   mineral oil-hydrophilic petrolatum (AQUAPHOR) ointment Apply topically as needed for dry skin. 420 g 0   pravastatin  (PRAVACHOL ) 40 MG tablet Take 1 tablet by mouth once daily 90 tablet 3   TRULICITY  1.5 MG/0.5ML SOAJ INJECT 1.5 MG INTO THE SKIN ONCE A WEEK 4 mL 0   vitamin C (ASCORBIC ACID) 500 MG tablet Take 500 mg by mouth daily.     No current facility-administered medications for this visit.    Patient confirms/reports the following allergies:  Allergies  Allergen Reactions   Sulfa Antibiotics     No orders of the defined types were placed in this encounter.   AUTHORIZATION INFORMATION Primary Insurance: 1D#: Group #:  Secondary Insurance: 1D#: Group #:  SCHEDULE INFORMATION: Date: 11/28/24 Time: Location: [facility]

## 2024-08-28 ENCOUNTER — Other Ambulatory Visit: Payer: Self-pay | Admitting: Physician Assistant

## 2024-08-30 ENCOUNTER — Ambulatory Visit: Admitting: Physician Assistant

## 2024-08-30 ENCOUNTER — Telehealth: Payer: Self-pay

## 2024-08-30 NOTE — Telephone Encounter (Signed)
 Patient was identified as falling into the [place] Measure - Diabetes.   Patient was: Appointment already scheduled for:  09/01/2024.

## 2024-09-01 ENCOUNTER — Ambulatory Visit: Admitting: Physician Assistant

## 2024-09-01 ENCOUNTER — Ambulatory Visit (INDEPENDENT_AMBULATORY_CARE_PROVIDER_SITE_OTHER)

## 2024-09-01 ENCOUNTER — Ambulatory Visit

## 2024-09-01 ENCOUNTER — Encounter: Payer: Self-pay | Admitting: Physician Assistant

## 2024-09-01 VITALS — BP 128/79 | HR 75 | Resp 16 | Ht 65.0 in | Wt 263.8 lb

## 2024-09-01 DIAGNOSIS — E1169 Type 2 diabetes mellitus with other specified complication: Secondary | ICD-10-CM

## 2024-09-01 DIAGNOSIS — E785 Hyperlipidemia, unspecified: Secondary | ICD-10-CM

## 2024-09-01 DIAGNOSIS — R748 Abnormal levels of other serum enzymes: Secondary | ICD-10-CM

## 2024-09-01 DIAGNOSIS — Z7985 Long-term (current) use of injectable non-insulin antidiabetic drugs: Secondary | ICD-10-CM | POA: Diagnosis not present

## 2024-09-01 DIAGNOSIS — J018 Other acute sinusitis: Secondary | ICD-10-CM

## 2024-09-01 DIAGNOSIS — I152 Hypertension secondary to endocrine disorders: Secondary | ICD-10-CM

## 2024-09-01 DIAGNOSIS — E1159 Type 2 diabetes mellitus with other circulatory complications: Secondary | ICD-10-CM

## 2024-09-01 MED ORDER — CETIRIZINE HCL 10 MG PO TABS: 10.0000 mg | ORAL_TABLET | Freq: Every day | ORAL | 11 refills | Status: AC

## 2024-09-01 MED ORDER — TRULICITY 3 MG/0.5ML ~~LOC~~ SOAJ: 3.0000 mg | SUBCUTANEOUS | 1 refills | Status: DC

## 2024-09-01 MED ORDER — FLUTICASONE PROPIONATE 50 MCG/ACT NA SUSP: 2.0000 | Freq: Every day | NASAL | 0 refills | Status: AC

## 2024-09-01 NOTE — Progress Notes (Signed)
 Established patient visit  Patient: Jodi Becker   DOB: May 11, 1956   68 y.o. Female  MRN: [id number] Visit Date: 09/01/2024  Today's healthcare provider: Jolynn Spencer, PA-C   Chief Complaint  Patient presents with   Medical Management of Chronic Issues   Subjective       Discussed the use of AI scribe software for clinical note transcription with the patient, who gave verbal consent to proceed.  History of Present Illness Jodi Becker is a 68 year old female with diabetes and hypertension who presents with sinus congestion and cough.  She has had sinus congestion and a cough for two weeks. Over-the-counter allergy medications and a mucus relief product have been ineffective. She experiences a sensation of mucus in her throat, causing frequent coughing. There is no ear or eye discharge, and she has not had a fever. Occasional sore throat from coughing is relieved by warm salt water gargles and cough drops.  Her blood sugar readings range from 140 mg/dL to 769 mg/dL, and she monitors her levels daily. She uses Trulicity , with a recent recommendation to increase the dosage.  Her blood pressure is stable, with recent readings between 120/80 mmHg and 130/80 mmHg. She has not experienced issues with her current medication regimen.       09/01/2024    9:17 AM 06/22/2024   10:19 AM 02/05/2024    8:43 AM  Depression screen PHQ 2/9  Decreased Interest 0 0 0  Down, Depressed, Hopeless 0 0 0  PHQ - 2 Score 0 0 0  Altered sleeping  0 0  Tired, decreased energy  0 0  Change in appetite  0 0  Feeling bad or failure about yourself   0 0  Trouble concentrating  0 0  Moving slowly or fidgety/restless  0 0  Suicidal thoughts  0 0  PHQ-9 Score  0  0   Difficult doing work/chores  Not difficult at all Not difficult at all     Data saved with a previous flowsheet row definition      09/01/2024    9:17 AM 02/05/2024    8:43 AM  GAD 7 : Generalized Anxiety Score  Nervous,  Anxious, on Edge 0 0  Control/stop worrying 0 0  Worry too much - different things 0 0  Trouble relaxing 0 0  Restless 0 0  Easily annoyed or irritable 0 0  Afraid - awful might happen 0 0  Total GAD 7 Score 0 0  Anxiety Difficulty Not difficult at all Not difficult at all    Medications: Outpatient Medications Prior to Visit  Medication Sig   amLODipine  (NORVASC ) 10 MG tablet Take 1 tablet (10 mg total) by mouth daily. TAKE 1 TABLET BY MOUTH ONCE DAILY.   aspirin 81 MG chewable tablet Chew by mouth.   b complex vitamins capsule Take 1 capsule by mouth daily.   Biotin 5000 MCG TABS Take 2 tablets by mouth.   Cholecalciferol (VITAMIN D3) 1000 UNITS CAPS Take 2,000 Units by mouth daily.   dapagliflozin  propanediol (FARXIGA ) 10 MG TABS tablet Take 1 tablet (10 mg total) by mouth daily.   glipiZIDE  (GLUCOTROL  XL) 10 MG 24 hr tablet TAKE 2 TABLETS BY MOUTH ONCE DAILY WITH BREAKFAST   Glucose Blood (BLOOD GLUCOSE TEST STRIPS 333) STRP To test daily and as needed for abnormal reading   hydrochlorothiazide  (HYDRODIURIL ) 25 MG tablet Take 1 tablet (25 mg total) by mouth daily.   Lancets (ONETOUCH DELICA PLUS LANCET33G) MISC  USE TO CHECK BLOOD SUGAR UP TO 4 TIMES DAILY   loratadine  (CLARITIN ) 10 MG tablet Take 1 tablet by mouth once daily   losartan  (COZAAR ) 100 MG tablet Take 1 tablet (100 mg total) by mouth daily. TAKE 1 TABLET BY MOUTH ONCE DAILY .   Magnesium 100 MG TABS Take 100 mg by mouth daily.   metFORMIN  (GLUCOPHAGE ) 500 MG tablet Take 2 tablets by mouth twice daily   mineral oil-hydrophilic petrolatum (AQUAPHOR) ointment Apply topically as needed for dry skin.   pravastatin  (PRAVACHOL ) 40 MG tablet Take 1 tablet by mouth once daily   TRULICITY  1.5 MG/0.5ML SOAJ INJECT 1.5 MG INTO THE SKIN ONCE A WEEK   vitamin C (ASCORBIC ACID) 500 MG tablet Take 500 mg by mouth daily.   No facility-administered medications prior to visit.    Review of Systems All negative Except see HPI        Objective    BP 128/79 (BP Location: Left Arm, Patient Position: Sitting, Cuff Size: Large)   Pulse 75   Resp 16   Ht 5' 5 (1.651 m)   Wt 263 lb 12.8 oz (119.7 kg)   SpO2 100%   BMI 43.90 kg/m     Physical Exam Vitals reviewed.  Constitutional:      General: She is not in acute distress.    Appearance: Normal appearance. She is well-developed. She is not diaphoretic.  HENT:     Head: Normocephalic and atraumatic.  Eyes:     General: No scleral icterus.    Conjunctiva/sclera: Conjunctivae normal.  Neck:     Thyroid : No thyromegaly.  Cardiovascular:     Rate and Rhythm: Normal rate and regular rhythm.     Pulses: Normal pulses.     Heart sounds: Normal heart sounds. No murmur heard. Pulmonary:     Effort: Pulmonary effort is normal. No respiratory distress.     Breath sounds: Normal breath sounds. No wheezing, rhonchi or rales.  Musculoskeletal:     Cervical back: Neck supple.     Right lower leg: No edema.     Left lower leg: No edema.  Lymphadenopathy:     Cervical: No cervical adenopathy.  Skin:    General: Skin is warm and dry.     Findings: No rash.  Neurological:     Mental Status: She is alert and oriented to person, place, and time. Mental status is at baseline.  Psychiatric:        Mood and Affect: Mood normal.        Behavior: Behavior normal.      No results found for any visits on 09/01/24.      Assessment & Plan Acute sinusitis with nasal congestion and cough Two weeks of nasal congestion and cough with mucus. No fever or significant improvement with OTC medications. - Prescribed nasal spray /flonase . - Recommended sinus rinse. - Advised warm salt water gargles and hot tea with honey. - Suggested cepacol or Zicam lozenges and cough drops. - [employer] for Zyrtec  and prescribed if covered. Will follow-up  Type 2 diabetes mellitus Chronic and unstable Recent improvement in blood sugar levels, but fasting levels remain  slightly elevated. A1c was 9.6 on 05/30/24, fasting BG at home is 183 - Increased Trulicity  dosage to 3 mg, per pharmacist's recommendation. Continue taking metformin  500 and glipizide  10. Will taper depending on BG. - Continue daily blood sugar monitoring. Will recheck A1c and cmp Will follow-up  Hypertension Chronic and stable Recent readings show  improvement. Target is 120/80 mmHg or at least 130/80 mmHg. - Continue current antihypertensive regimen: amlodipine  10, hydrochlorothiazide  25, losartan  100. - Monitor blood pressure regularly at home. Recheck cmp Will follow-up   Type 2 diabetes mellitus with other specified complication, without long-term current use of insulin (HCC) (Primary)  - Comprehensive metabolic panel with GFR - Hemoglobin A1c - Lipid panel  Hyperlipidemia associated with type 2 diabetes mellitus (HCC) Chronic and stable with LDL of 51, at goal However, The 10-year ASCVD risk score (Arnett DK, et al., 2019) is: 17.4% Continue pravastatin  40 -lp recheck Will reassess after  receiving lab results Continue lifestyle modifications Will follow-up   Acute non-recurrent sinusitis of other sinus  - fluticasone  (FLONASE ) 50 MCG/ACT nasal spray; Place 2 sprays into both nostrils daily.  Dispense: 16 g; Refill: 0 - cetirizine  (ZYRTEC ) 10 MG tablet; Take 1 tablet (10 mg total) by mouth daily.  Dispense: 30 tablet; Refill: 11  Elevated liver enzyme Persistent Recheck cmp and will follow-up  No orders of the defined types were placed in this encounter.   No follow-ups on file.   The patient was advised to call back or seek an in-person evaluation if the symptoms worsen or if the condition fails to improve as anticipated.  I discussed the assessment and treatment plan with the patient. The patient was provided an opportunity to ask questions and all were answered. The patient agreed with the plan and demonstrated an understanding of the instructions.  I, Elliette Seabolt, PA-C have reviewed all documentation for this visit. The documentation on 09/01/2024  for the exam, diagnosis, procedures, and orders are all accurate and complete.  Jolynn Spencer, [facility] MMS [facility] [phone] (phone) [phone] (fax)  [facility]

## 2024-09-01 NOTE — Progress Notes (Signed)
 S:     Reason for visit: ?  Jodi  A Becker is a 68 y.o. female with a history of diabetes (type 2), who presents today for a follow up diabetes Face to Face pharmacotherapy visit.? Pertinent PMH also includes HTN, HLD, and obesity.  Care Team: Primary Care Provider: Ostwalt, Janna, PA-C  At last visit with clinical pharmacist on 07/20/24, fasting BG readings were 126-204 mg/dL. Trulicity  was increased from 0.75 mg to 1.5 mg weekly.   Current diabetes medications include: metformin  500 mg 2 tablets BID, Farxiga  10 mg daily, Trulicity  1.5 mg weekly (Saturdays), glipizide  XL 5 mg 1 tablet BID Previous diabetes medications include: Trulicity , Tradjenta , Januvia, Ozempic  (GI upset), Rybelsus , pioglitazone  Current hypertension medications include: amlodipine  10 mg daily, losartan  100 mg daily, hydrochlorothiazide  25 mg daily  Current hyperlipidemia medications include: pravastatin  40 mg daily  Patient reports adherence to taking all medications as prescribed. Uses a weekly pill box.    Have you been experiencing any side effects to the medications prescribed? Yes - constipation from Trulicity  Do you have any problems obtaining medications due to transportation or finances? yes Insurance coverage: [employer]  Current medication access support:  Farxiga  via [employer] until 10/12/25  Patient denies hypoglycemic events.  Reported home fasting blood sugars: 111-183 mg/dL  Patient reported dietary habits: Eats 2 meals/day  Breakfast: oatmeal, eggs, bacon, sausage biscuit Lunch: meat and salad, hotdog, cheeseburger Dinner: vegetable and protein Snacks: chips, fruit Drinks: coffee, sugar-free tea   Patient-reported exercise habits: walking 2 times per week  Hypertension: Patient has a validated, automated, upper arm home BP cuff Current blood pressure readings readings: average 110-134/69-79   Patient denies  hypotensive s/sx including dizziness, lightheadedness.  Patient  denies hypertensive symptoms including chest pain, shortness of breath  DM Prevention:  Statin: Taking; moderate intensity.?  ACE/ARB: yes; losartan  Last urinary albumin/creatinine ratio:  Lab Results  Component Value Date   MICRALBCREAT 6 11/05/2023   MICRALBCREAT 5 10/24/2022   MICRALBCREAT <4 10/22/2021   Last eye exam:  Lab Results  Component Value Date   HMDIABEYEEXA No Retinopathy 09/22/2019   Lab Results  Component Value Date   HMDIABEYEEXA No Retinopathy 09/22/2019   Last foot exam: 04/28/2023 Tobacco Use:  Tobacco Use: Low Risk  (06/22/2024)   Patient History    Smoking Tobacco Use: Never    Smokeless Tobacco Use: Never    Passive Exposure: Not on file   O:   Vitals:  Wt Readings from Last 3 Encounters:  05/30/24 259 lb 14.4 oz (117.9 kg)  04/14/24 258 lb (117 kg)  02/05/24 261 lb (118.4 kg)   BP Readings from Last 3 Encounters:  07/20/24 126/73  06/16/24 109/71  05/30/24 130/78   Pulse Readings from Last 3 Encounters:  07/20/24 63  06/16/24 69  05/30/24 72     Labs:?  Lab Results  Component Value Date   HGBA1C 9.6 (H) 05/30/2024   HGBA1C 9.3 (H) 02/05/2024   HGBA1C 8.4 (H) 11/05/2023   GLUCOSE 164 (H) 05/30/2024   MICRALBCREAT 6 11/05/2023   MICRALBCREAT 5 10/24/2022   MICRALBCREAT <4 10/22/2021   CREATININE 0.71 05/30/2024   CREATININE 0.84 02/05/2024   CREATININE 0.73 11/05/2023    Lab Results  Component Value Date   CHOL 138 05/30/2024   LDLCALC 51 05/30/2024   LDLCALC 56 02/05/2024   LDLCALC 58 11/05/2023   HDL 65 05/30/2024   TRIG 126 05/30/2024   TRIG 103 02/05/2024   TRIG 123 11/05/2023   ALT  44 (H) 05/30/2024   ALT 34 (H) 02/05/2024   AST 24 05/30/2024   AST 20 02/05/2024      Chemistry      Component Value Date/Time   NA 139 05/30/2024 0904   K 4.1 05/30/2024 0904   CL 100 05/30/2024 0904   CO2 20 05/30/2024 0904   BUN 15 05/30/2024 0904   CREATININE 0.71 05/30/2024 0904   CREATININE 0.73 11/05/2023 0914       Component Value Date/Time   CALCIUM 11.1 (H) 05/30/2024 0904   ALKPHOS 85 05/30/2024 0904   AST 24 05/30/2024 0904   ALT 44 (H) 05/30/2024 0904   BILITOT 0.6 05/30/2024 0904       The 10-year ASCVD risk score (Arnett DK, et al., 2019) is: 16.9%  Lab Results  Component Value Date   MICRALBCREAT 6 11/05/2023   MICRALBCREAT 5 10/24/2022   MICRALBCREAT <4 10/22/2021    A/P: Diabetes currently uncontrolled with a most recent A1c of 9.6% on 05/30/24, which is up from 9.6% on 02/05/24. Patient is able to verbalize appropriate hypoglycemia management plan. Medication adherence appears appropriate. Fasting BG readings have improved with a range of 111-183 mg/dL. Will increase GLP1 dose at this time.  -Increased dose of GLP-1 Trulicity  (dulaglutide ) 3 mg weekly -Continued SGLT2-I Farxiga  (dapagliflozin )10 mg daily.  -Continued metformin  500 mg daily.  -Continued glipizide  10 mg daily. Will discuss dose decrease at follow up. -Patient educated on purpose, proper use, and potential adverse effects of Trulicity .  -Extensively discussed pathophysiology of diabetes, recommended lifestyle interventions, dietary effects on blood sugar control.  -Counseled on s/sx of and management of hypoglycemia.  -Next A1c anticipated today.   ASCVD risk - primary prevention in patient with diabetes. Last LDL is 51 mg/dL, at goal of <29 mg/dL.  -Continued pravastatin  40 mg daily.   Hypertension longstanding currently controlled. Blood pressure goal of <130/80 mmHg. Medication adherence appropriate.  -Continued hydrochlorothiazide  25 mg daily. -Continued amlodipine  10 mg daily -Continued losartan  100 mg daily -Counseled to check BP ~1 hour after medications and present with log at follow up appointment  Patient verbalized understanding of treatment plan. Total time patient counseling 20 minutes.  Follow-up:  Pharmacist on 09/29/24 PCP clinic visit on 12/02/24  [name] [name], PharmD, CPP Clinical  Pharmacist [facility] [phone]

## 2024-09-02 LAB — COMPREHENSIVE METABOLIC PANEL WITH GFR
ALT: 34 IU/L — ABNORMAL HIGH (ref 0–32)
AST: 22 IU/L (ref 0–40)
Albumin: 4.6 g/dL (ref 3.9–4.9)
Alkaline Phosphatase: 74 IU/L (ref 49–135)
BUN/Creatinine Ratio: 20 (ref 12–28)
BUN: 14 mg/dL (ref 8–27)
Bilirubin Total: 0.5 mg/dL (ref 0.0–1.2)
CO2: 24 mmol/L (ref 20–29)
Calcium: 11.5 mg/dL — ABNORMAL HIGH (ref 8.7–10.3)
Chloride: 99 mmol/L (ref 96–106)
Creatinine, Ser: 0.69 mg/dL (ref 0.57–1.00)
Globulin, Total: 2.6 g/dL (ref 1.5–4.5)
Glucose: 99 mg/dL (ref 70–99)
Potassium: 4.2 mmol/L (ref 3.5–5.2)
Sodium: 143 mmol/L (ref 134–144)
Total Protein: 7.2 g/dL (ref 6.0–8.5)
eGFR: 94 mL/min/1.73 (ref >59)

## 2024-09-02 LAB — LIPID PANEL
Chol/HDL Ratio: 1.9 ratio (ref 0.0–4.4)
Cholesterol, Total: 136 mg/dL (ref 100–199)
HDL: 70 mg/dL (ref >39)
LDL Chol Calc (NIH): 47 mg/dL (ref 0–99)
Triglycerides: 106 mg/dL (ref 0–149)
VLDL Cholesterol Cal: 19 mg/dL (ref 5–40)

## 2024-09-02 LAB — HEMOGLOBIN A1C
Est. average glucose Bld gHb Est-mCnc: 186 mg/dL
Hgb A1c MFr Bld: 8.1 % — ABNORMAL HIGH (ref 4.8–5.6)

## 2024-09-05 ENCOUNTER — Ambulatory Visit: Payer: Self-pay | Admitting: Physician Assistant

## 2024-09-05 ENCOUNTER — Ambulatory Visit: Payer: Self-pay

## 2024-09-05 DIAGNOSIS — J018 Other acute sinusitis: Secondary | ICD-10-CM

## 2024-09-05 NOTE — Telephone Encounter (Signed)
 FYI Only or Action Required?: Action required by provider: update on patient condition. Request ABX  Patient was last seen in primary care on 09/01/2024 by Ostwalt, Janna, PA-C.  Called Nurse Triage reporting Sinusitis.  Symptoms began several weeks ago.  Interventions attempted: OTC medications: zyrtec  and Rest, hydration, or home remedies.  Symptoms are: gradually worsening.  Triage Disposition: See Physician Within 24 Hours  Patient/caregiver understands and will follow disposition?: No, wishes to speak with PCP  Copied from CRM #[id number]. Topic: Clinical - Prescription Issue >> Sep 05, 2024  8:31 AM Avram MATSU wrote: Reason for CRM: patient was told by her provider yo call Monday if the medication has not been working. Patient stated the provider will send in antibiotic. Please advise [phone]   [facility] [address] ([address]), [place] - [address]) [place] [address] Phone: [phone] Fax: [phone] Reason for Disposition  [1] Continuous (nonstop) coughing interferes with work or school AND [2] no improvement using cough treatment per Care Advice  Answer Assessment - Initial Assessment Questions Pt with persistent symptoms. Present at [facility] 11/20 and was told if allergy type meds and saline wash did not improve to call in for ABX. Since she takes something for allergies daily anyways there has not been much improvement. Allergy to Sulfa Antibiotics. Pharmacy confirmed. Requesting an ABX that is covered by insurance  ED/UC precautions advised and understood.   1. ONSET: When did the cough begin?      2-3 weeks  2. SEVERITY: How bad is the cough today?      Waking her up at night- feels like phelgm getting stuck  3. SPUTUM: Describe the color of your sputum (e.g., none, dry cough; clear, white, yellow, green)     Thick white, yellow 4. HEMOPTYSIS: Are you coughing up any blood? If Yes, ask: How much? (e.g.,  flecks, streaks, tablespoons, etc.)     denies 5. DIFFICULTY BREATHING: Are you having difficulty breathing? If Yes, ask: How bad is it? (e.g., mild, moderate, severe)      Denies SOB, wheezing when she lays down  6. FEVER: Do you have a fever? If Yes, ask: What is your temperature, how was it measured, and when did it start?     denies 7. CARDIAC HISTORY: Do you have any history of heart disease? (e.g., heart attack, congestive heart failure)      HTN  8. LUNG HISTORY: Do you have any history of lung disease?  (e.g., pulmonary embolus, asthma, emphysema)     allergies 9. PE RISK FACTORS: Do you have a history of blood clots? (or: recent major surgery, recent prolonged travel, bedridden)     denies 10. OTHER SYMPTOMS: Do you have any other symptoms? (e.g., runny nose, wheezing, chest pain)       Wheezing when laying down,  12. TRAVEL: Have you traveled out of the country in the last month? (e.g., travel history, exposures)       denies  Protocols used: Cough - Acute Productive-A-AH

## 2024-09-06 ENCOUNTER — Ambulatory Visit: Payer: Self-pay | Admitting: *Deleted

## 2024-09-06 ENCOUNTER — Other Ambulatory Visit: Payer: Self-pay

## 2024-09-06 DIAGNOSIS — J018 Other acute sinusitis: Secondary | ICD-10-CM

## 2024-09-06 MED ORDER — AMOXICILLIN-POT CLAVULANATE 875-125 MG PO TABS: 1.0000 | ORAL_TABLET | Freq: Two times a day (BID) | ORAL | 0 refills | Status: DC

## 2024-09-06 MED ORDER — AMOXICILLIN-POT CLAVULANATE 875-125 MG PO TABS
1.0000 | ORAL_TABLET | Freq: Two times a day (BID) | ORAL | 0 refills | Status: DC
Start: 2024-09-06 — End: 2024-09-06

## 2024-09-06 NOTE — Addendum Note (Signed)
 Addended by: Feliza Diven on: 09/06/2024 09:04 AM   Modules accepted: Orders

## 2024-09-06 NOTE — Addendum Note (Signed)
 Addended by: Sani Loiseau on: 09/06/2024 09:14 AM   Modules accepted: Orders

## 2024-09-06 NOTE — Telephone Encounter (Signed)
 Med was sent in electronically.

## 2024-09-06 NOTE — Telephone Encounter (Signed)
 Spoke with pharmacy, pt has already picked up medication

## 2024-09-06 NOTE — Telephone Encounter (Signed)
 FYI Only or Action Required?: Action required by provider: follow up Rx- not received by pharmacy.  Patient was last seen in primary care on 09/01/2024 by Ostwalt, Janna, PA-C.  Called Nurse Triage reporting No chief complaint on file..  Symptoms began several days ago.  Interventions attempted: Prescription medications: Patient states she has been using all prescribed medications from [facility]- she is awaiting Rx for antibiotic.  Symptoms are: gradually worsening.  Triage Disposition: Call PCP Now  Patient/caregiver understands and will follow disposition?: Yes- Please notify patient when Rx verified received by pharmacy   Copied from CRM [id number]. Topic: Clinical - Red Word Triage >> Sep 06, 2024  9:12 AM Antwanette L wrote: Red Word that prompted transfer to Nurse Triage: Patient called stating she has not picked up her prescribed medications. Patient was seen on 11/20 by Janna. Chart review shows an order for cetirizine  (Zyrtec ) 10 mg tablet placed on 11/20 and an order for amoxicillin -clavulanate (Augmentin ) 875-125 mg tablet placed today. Patient reports coughing up thick mucus and having a runny nose >> Sep 06, 2024  9:19 AM Antwanette L wrote: An order for (FLONASE ) 50 MCG/ACT nasal spray was placed on 11/20 Reason for Disposition  [1] Prescription not at pharmacy AND [2] was prescribed by doctor (or NP/PA) recently  (Exception: [facility] has access to EMR and prescription is recorded there. Go to Home Care and confirm for pharmacy.)  Answer Assessment - Initial Assessment Questions 1. NAME of MEDICINE: What medicine(s) are you calling about?     amoxicillin -clavulanate (Augmentin ) 875-125 mg  2. QUESTION: What is your question? (e.g., double dose of medicine, side effect)     Patient states Rx is not at pharmacy- patient advised just sent this morning- will call pharmacy to verify they received  3. PRESCRIBER: Who prescribed the medicine? Reason: if prescribed by specialist, call  should be referred to that group.     PCP 4. SYMPTOMS: Do you have any symptoms? If Yes, ask: What symptoms are you having?  How bad are the symptoms (e.g., mild, moderate, severe)     Cough, congestion  Called pharmacy- Rx has not been received yet. Note sent to office for follow up.  Protocols used: Medication Question Call-A-AH

## 2024-09-11 ENCOUNTER — Other Ambulatory Visit: Payer: Self-pay | Admitting: Physician Assistant

## 2024-09-11 DIAGNOSIS — I10 Essential (primary) hypertension: Secondary | ICD-10-CM

## 2024-09-12 DIAGNOSIS — I739 Peripheral vascular disease, unspecified: Secondary | ICD-10-CM | POA: Diagnosis not present

## 2024-09-12 DIAGNOSIS — B351 Tinea unguium: Secondary | ICD-10-CM | POA: Diagnosis not present

## 2024-09-12 DIAGNOSIS — E1165 Type 2 diabetes mellitus with hyperglycemia: Secondary | ICD-10-CM | POA: Diagnosis not present

## 2024-09-29 ENCOUNTER — Ambulatory Visit

## 2024-09-29 DIAGNOSIS — Z7985 Long-term (current) use of injectable non-insulin antidiabetic drugs: Secondary | ICD-10-CM

## 2024-09-29 DIAGNOSIS — E119 Type 2 diabetes mellitus without complications: Secondary | ICD-10-CM | POA: Diagnosis not present

## 2024-09-29 DIAGNOSIS — E1169 Type 2 diabetes mellitus with other specified complication: Secondary | ICD-10-CM

## 2024-09-29 MED ORDER — GLIPIZIDE ER 10 MG PO TB24: 10.0000 mg | ORAL_TABLET | Freq: Every day | ORAL | 1 refills | Status: DC

## 2024-09-29 NOTE — Progress Notes (Signed)
 S:     Reason for visit: ?  Jodi Becker is a 68 y.o. female with a history of diabetes (type 2), who presents today for a follow up diabetes Face to Face pharmacotherapy visit.? Pertinent PMH also includes HTN, HLD, and obesity.  Care Team: Primary Care Provider: Ostwalt, Janna, PA-C  At last visit with clinical pharmacist on 09/01/24, A1c was improved at 8.1% from 9.6%. Trulicity  was increased to 3 mg at that time.   Today, patient reports she just picked up a new box of Trulicity  3 mg.  Current diabetes medications include: metformin  500 mg 2 tablets BID, Farxiga  10 mg daily, Trulicity  3 mg weekly (Saturdays), glipizide  XL 10 mg 2 tablets daily Previous diabetes medications include: Tradjenta , Januvia, Ozempic  (GI upset), Rybelsus , pioglitazone  Current hypertension medications include: amlodipine  10 mg daily, losartan  100 mg daily, hydrochlorothiazide  25 mg daily  Current hyperlipidemia medications include: pravastatin  40 mg daily  Patient reports adherence to taking all medications as prescribed. Uses a weekly pill box.    Have you been experiencing any side effects to the medications prescribed? Yes - constipation from Trulicity  Do you have any problems obtaining medications due to transportation or finances? yes Insurance coverage: [employer]  Current medication access support:  Farxiga  via AZ&Me until 10/12/25  Patient denies hypoglycemic events.  Reported home fasting blood sugars: 105-148 mg/dL  Patient reported dietary habits: Eats 2 meals/day  Breakfast: oatmeal, eggs, bacon, sausage biscuit Lunch: meat and salad, hotdog, cheeseburger Dinner: vegetable and protein Snacks: chips, fruit Drinks: coffee, sugar-free tea   Patient-reported exercise habits: walking 2 times per week  Hypertension: Patient has a validated, automated, upper arm home BP cuff Current blood pressure readings readings: average 109-133/69-83   Patient denies  hypotensive  s/sx including dizziness, lightheadedness.  Patient denies hypertensive symptoms including chest pain, shortness of breath  DM Prevention:  Statin: Taking; moderate intensity.?  ACE/ARB: yes; losartan  Last urinary albumin/creatinine ratio:  Lab Results  Component Value Date   MICRALBCREAT 6 11/05/2023   MICRALBCREAT 5 10/24/2022   MICRALBCREAT <4 10/22/2021   Last eye exam:  Lab Results  Component Value Date   HMDIABEYEEXA No Retinopathy 09/22/2019   Lab Results  Component Value Date   HMDIABEYEEXA No Retinopathy 09/22/2019   Last foot exam: 04/28/2023 Tobacco Use:  Tobacco Use: Low Risk (09/01/2024)   Patient History    Smoking Tobacco Use: Never    Smokeless Tobacco Use: Never    Passive Exposure: Not on file   O:   Vitals:  Wt Readings from Last 3 Encounters:  09/01/24 263 lb 12.8 oz (119.7 kg)  05/30/24 259 lb 14.4 oz (117.9 kg)  04/14/24 258 lb (117 kg)   BP Readings from Last 3 Encounters:  09/01/24 128/79  07/20/24 126/73  06/16/24 109/71   Pulse Readings from Last 3 Encounters:  09/01/24 75  07/20/24 63  06/16/24 69     Labs:?  Lab Results  Component Value Date   HGBA1C 8.1 (H) 09/01/2024   HGBA1C 9.6 (H) 05/30/2024   HGBA1C 9.3 (H) 02/05/2024   GLUCOSE 99 09/01/2024   MICRALBCREAT 6 11/05/2023   MICRALBCREAT 5 10/24/2022   MICRALBCREAT <4 10/22/2021   CREATININE 0.69 09/01/2024   CREATININE 0.71 05/30/2024   CREATININE 0.84 02/05/2024    Lab Results  Component Value Date   CHOL 136 09/01/2024   LDLCALC 47 09/01/2024   LDLCALC 51 05/30/2024   LDLCALC 56 02/05/2024   HDL 70 09/01/2024   TRIG 106  09/01/2024   TRIG 126 05/30/2024   TRIG 103 02/05/2024   ALT 34 (H) 09/01/2024   ALT 44 (H) 05/30/2024   AST 22 09/01/2024   AST 24 05/30/2024      Chemistry      Component Value Date/Time   NA 143 09/01/2024 1016   K 4.2 09/01/2024 1016   CL 99 09/01/2024 1016   CO2 24 09/01/2024 1016   BUN 14 09/01/2024 1016   CREATININE 0.69  09/01/2024 1016   CREATININE 0.73 11/05/2023 0914      Component Value Date/Time   CALCIUM 11.5 (H) 09/01/2024 1016   ALKPHOS 74 09/01/2024 1016   AST 22 09/01/2024 1016   ALT 34 (H) 09/01/2024 1016   BILITOT 0.5 09/01/2024 1016       The 10-year ASCVD risk score (Arnett DK, et al., 2019) is: 20.4%  Lab Results  Component Value Date   MICRALBCREAT 6 11/05/2023   MICRALBCREAT 5 10/24/2022   MICRALBCREAT <4 10/22/2021    A/P: Diabetes currently uncontrolled with a most recent A1c of 8.1% on 09/01/24, which is down from 9.6% on 05/30/24. Patient is able to verbalize appropriate hypoglycemia management plan. Medication adherence appears appropriate. Fasting BG readings have improved with a range of 105-148 mg/dL. Will increase GLP1 dose at follow up since she currently has 6 weeks left of therapy at her current dose at home. Will decrease glipizide  dose given age and duration of therapy.  -Continued GLP-1 Trulicity  (dulaglutide ) 3 mg weekly -Continued SGLT2-I Farxiga  (dapagliflozin )10 mg daily.  -Continued metformin  500 mg daily.  -Decreased dose of glipizide   to 10 mg daily. Will discuss discontinuation at follow up  -Patient educated on purpose, proper use, and potential adverse effects of Trulicity .  -Extensively discussed pathophysiology of diabetes, recommended lifestyle interventions, dietary effects on blood sugar control.  -Counseled on s/sx of and management of hypoglycemia.  -Next A1c anticipated today.   ASCVD risk - primary prevention in patient with diabetes. Last LDL is 47 mg/dL, at goal of <29 mg/dL.  -Continued pravastatin  40 mg daily.   Patient verbalized understanding of treatment plan. Total time patient counseling 20 minutes.  Follow-up:  Pharmacist on 11/02/24 PCP clinic visit on 12/02/24  [name] [name], PharmD, CPP Clinical Pharmacist [facility] [phone]

## 2024-10-20 ENCOUNTER — Other Ambulatory Visit: Payer: Self-pay | Admitting: Physician Assistant

## 2024-10-20 DIAGNOSIS — E1169 Type 2 diabetes mellitus with other specified complication: Secondary | ICD-10-CM

## 2024-10-23 ENCOUNTER — Other Ambulatory Visit: Payer: Self-pay | Admitting: Physician Assistant

## 2024-10-23 DIAGNOSIS — E1169 Type 2 diabetes mellitus with other specified complication: Secondary | ICD-10-CM

## 2024-10-24 ENCOUNTER — Other Ambulatory Visit: Payer: Self-pay

## 2024-10-24 DIAGNOSIS — E1169 Type 2 diabetes mellitus with other specified complication: Secondary | ICD-10-CM

## 2024-10-24 MED ORDER — TRULICITY 3 MG/0.5ML ~~LOC~~ SOAJ: 3.0000 mg | SUBCUTANEOUS | 0 refills | Status: DC

## 2024-11-02 ENCOUNTER — Ambulatory Visit (INDEPENDENT_AMBULATORY_CARE_PROVIDER_SITE_OTHER)

## 2024-11-02 VITALS — BP 117/67 | HR 68

## 2024-11-02 DIAGNOSIS — E1159 Type 2 diabetes mellitus with other circulatory complications: Secondary | ICD-10-CM

## 2024-11-02 DIAGNOSIS — E1169 Type 2 diabetes mellitus with other specified complication: Secondary | ICD-10-CM | POA: Diagnosis not present

## 2024-11-02 DIAGNOSIS — I152 Hypertension secondary to endocrine disorders: Secondary | ICD-10-CM

## 2024-11-02 DIAGNOSIS — E785 Hyperlipidemia, unspecified: Secondary | ICD-10-CM | POA: Diagnosis not present

## 2024-11-02 DIAGNOSIS — Z7984 Long term (current) use of oral hypoglycemic drugs: Secondary | ICD-10-CM | POA: Diagnosis not present

## 2024-11-02 MED ORDER — TRULICITY 4.5 MG/0.5ML ~~LOC~~ SOAJ: 4.5000 mg | SUBCUTANEOUS | 1 refills | Status: AC

## 2024-11-02 NOTE — Progress Notes (Signed)
 "  S:     Reason for visit: ?  Jodi Becker is a 69 y.o. female with a history of diabetes (type 2), who presents today for a follow up diabetes Face to Face pharmacotherapy visit.? Pertinent PMH also includes HTN, HLD, and obesity.  Care Team: Primary Care Provider: Ostwalt, Janna, PA-C  At last visit with clinical pharmacist on 09/29/24, patient was instructed to decrease glipizide  to one tablet daily.   Current diabetes medications include: metformin  500 mg 2 tablets BID, Farxiga  10 mg daily, Trulicity  3 mg weekly (Saturdays), glipizide  XL 10 mg 1 tablets daily Previous diabetes medications include: Tradjenta , Januvia, Ozempic  (GI upset), Rybelsus , pioglitazone  Current hypertension medications include: amlodipine  10 mg daily, losartan  100 mg daily, hydrochlorothiazide  25 mg daily  Current hyperlipidemia medications include: pravastatin  40 mg daily  Patient reports adherence to taking all medications as prescribed. Uses a weekly pill box.    Have you been experiencing any side effects to the medications prescribed? Yes - constipation from Trulicity  Do you have any problems obtaining medications due to transportation or finances? yes Insurance coverage: [employer]  Current medication access support:  Farxiga  via AZ&Me until 10/12/25  Patient denies hypoglycemic events.  Reported home fasting blood sugars: 95-140 mg/dL  Patient reported dietary habits: Eats 2 meals/day  Breakfast: oatmeal, eggs, bacon, sausage biscuit Lunch: meat and salad, hotdog, cheeseburger Dinner: vegetable and protein Snacks: chips, fruit Drinks: coffee, sugar-free tea   Patient-reported exercise habits: walking 2 times per week  Hypertension: Patient has a validated, automated, upper arm home BP cuff Current blood pressure readings readings: 103-141/61-76   Patient denies  hypotensive s/sx including dizziness, lightheadedness.  Patient denies hypertensive symptoms including chest  pain, shortness of breath  DM Prevention:  Statin: Taking; moderate intensity.?  ACE/ARB: yes; losartan  Last urinary albumin/creatinine ratio:  Lab Results  Component Value Date   MICRALBCREAT 6 11/05/2023   MICRALBCREAT 5 10/24/2022   MICRALBCREAT <4 10/22/2021   Last eye exam:  Lab Results  Component Value Date   HMDIABEYEEXA No Retinopathy 09/22/2019   Lab Results  Component Value Date   HMDIABEYEEXA No Retinopathy 09/22/2019   Last foot exam: 04/28/2023 Tobacco Use:  Tobacco Use: Low Risk (09/01/2024)   Patient History    Smoking Tobacco Use: Never    Smokeless Tobacco Use: Never    Passive Exposure: Not on file   O:   Vitals:  Wt Readings from Last 3 Encounters:  09/01/24 263 lb 12.8 oz (119.7 kg)  05/30/24 259 lb 14.4 oz (117.9 kg)  04/14/24 258 lb (117 kg)   BP Readings from Last 3 Encounters:  09/01/24 128/79  07/20/24 126/73  06/16/24 109/71   Pulse Readings from Last 3 Encounters:  09/01/24 75  07/20/24 63  06/16/24 69     Labs:?  Lab Results  Component Value Date   HGBA1C 8.1 (H) 09/01/2024   HGBA1C 9.6 (H) 05/30/2024   HGBA1C 9.3 (H) 02/05/2024   GLUCOSE 99 09/01/2024   MICRALBCREAT 6 11/05/2023   MICRALBCREAT 5 10/24/2022   MICRALBCREAT <4 10/22/2021   CREATININE 0.69 09/01/2024   CREATININE 0.71 05/30/2024   CREATININE 0.84 02/05/2024    Lab Results  Component Value Date   CHOL 136 09/01/2024   LDLCALC 47 09/01/2024   LDLCALC 51 05/30/2024   LDLCALC 56 02/05/2024   HDL 70 09/01/2024   TRIG 106 09/01/2024   TRIG 126 05/30/2024   TRIG 103 02/05/2024   ALT 34 (H) 09/01/2024   ALT 44 (  H) 05/30/2024   AST 22 09/01/2024   AST 24 05/30/2024      Chemistry      Component Value Date/Time   NA 143 09/01/2024 1016   K 4.2 09/01/2024 1016   CL 99 09/01/2024 1016   CO2 24 09/01/2024 1016   BUN 14 09/01/2024 1016   CREATININE 0.69 09/01/2024 1016   CREATININE 0.73 11/05/2023 0914      Component Value Date/Time   CALCIUM 11.5  (H) 09/01/2024 1016   ALKPHOS 74 09/01/2024 1016   AST 22 09/01/2024 1016   ALT 34 (H) 09/01/2024 1016   BILITOT 0.5 09/01/2024 1016       The 10-year ASCVD risk score (Arnett DK, et al., 2019) is: 21.8%  Lab Results  Component Value Date   MICRALBCREAT 6 11/05/2023   MICRALBCREAT 5 10/24/2022   MICRALBCREAT <4 10/22/2021    A/P: Diabetes currently uncontrolled with a most recent A1c of 8.1% on 09/01/24, which is down from 9.6% on 05/30/24. Patient is able to verbalize appropriate hypoglycemia management plan. Medication adherence appears appropriate. Fasting BG readings have improved with a range of 95-140 mg/dL most consistently with a few outliers. Patient just picked up refill of Trulicity  ~1 week ago. Will plan to increase dose at next fill and discontinue glipizide  at that time given age and duration of therapy (10+ years).  -Increased dose of GLP-1 Trulicity  (dulaglutide )to 4.5 mg weekly at next fill -Continued SGLT2-I Farxiga  (dapagliflozin )10 mg daily.  -Continued metformin  500 mg 2 tablets BID.  -Discontinued glipizide  when she increases Trulicity  -Patient educated on purpose, proper use, and potential adverse effects of Trulicity .  -Extensively discussed pathophysiology of diabetes, recommended lifestyle interventions, dietary effects on blood sugar control.  -Counseled on s/sx of and management of hypoglycemia.  -Next A1c anticipated today.   ASCVD risk - primary prevention in patient with diabetes. Last LDL is 47 mg/dL, at goal of <29 mg/dL.  -Continued pravastatin  40 mg daily.   Hypertension longstanding currently controlled. Blood pressure goal of <130/80 mmHg. Medication adherence appropriate.  -Continued amlodipine  10 mg daily. -Continued losartan  100 mg daily. -Continued hydrochlorothiazide  25 mg daily.  Patient verbalized understanding of treatment plan. Total time patient counseling 20 minutes.  Follow-up:  Pharmacist on 11/02/24 PCP clinic visit on  12/02/24  [name] [name], PharmD, BCACP, CPP Clinical Pharmacist [facility] [phone]    "

## 2024-11-02 NOTE — Patient Instructions (Signed)
 Thanks for visiting with me today!  Increase Trulicity  to 4.5 mg weekly Discontinue glipizide  when you increase Trulicity   Let me know if you have any questions.  Adalynd Donahoe E. Marsh, PharmD, BCACP, CPP Clinical Pharmacist [facility] [phone]

## 2024-11-28 ENCOUNTER — Encounter: Admission: RE | Payer: Self-pay | Source: Home / Self Care

## 2024-11-28 ENCOUNTER — Ambulatory Visit: Admission: RE | Admit: 2024-11-28 | Source: Home / Self Care | Admitting: Gastroenterology

## 2024-11-28 SURGERY — COLONOSCOPY
Anesthesia: General

## 2024-12-02 ENCOUNTER — Ambulatory Visit: Admitting: Physician Assistant

## 2024-12-13 ENCOUNTER — Ambulatory Visit: Admitting: Physician Assistant

## 2024-12-29 ENCOUNTER — Ambulatory Visit

## 2025-06-27 ENCOUNTER — Ambulatory Visit
